# Patient Record
Sex: Male | Born: 1935 | Race: White | Hispanic: No | Smoking: Former smoker
Health system: Southern US, Community
[De-identification: ages and names within clinical notes are randomized; demographics above are authoritative.]

## PROBLEM LIST (undated history)

## (undated) DIAGNOSIS — I10 Essential (primary) hypertension: Secondary | ICD-10-CM

## (undated) DIAGNOSIS — N400 Enlarged prostate without lower urinary tract symptoms: Secondary | ICD-10-CM

## (undated) DIAGNOSIS — K4090 Unilateral inguinal hernia, without obstruction or gangrene, not specified as recurrent: Secondary | ICD-10-CM

## (undated) DIAGNOSIS — R569 Unspecified convulsions: Secondary | ICD-10-CM

## (undated) DIAGNOSIS — J189 Pneumonia, unspecified organism: Secondary | ICD-10-CM

## (undated) DIAGNOSIS — E119 Type 2 diabetes mellitus without complications: Secondary | ICD-10-CM

## (undated) DIAGNOSIS — F411 Generalized anxiety disorder: Secondary | ICD-10-CM

## (undated) DIAGNOSIS — M1990 Unspecified osteoarthritis, unspecified site: Secondary | ICD-10-CM

## (undated) DIAGNOSIS — Z87442 Personal history of urinary calculi: Secondary | ICD-10-CM

## (undated) DIAGNOSIS — I639 Cerebral infarction, unspecified: Secondary | ICD-10-CM

## (undated) DIAGNOSIS — G4700 Insomnia, unspecified: Secondary | ICD-10-CM

## (undated) DIAGNOSIS — D649 Anemia, unspecified: Secondary | ICD-10-CM

## (undated) DIAGNOSIS — H409 Unspecified glaucoma: Secondary | ICD-10-CM

## (undated) HISTORY — DX: Benign prostatic hyperplasia without lower urinary tract symptoms: N40.0

## (undated) HISTORY — DX: Essential (primary) hypertension: I10

## (undated) HISTORY — DX: Insomnia, unspecified: G47.00

## (undated) HISTORY — PX: CATARACT EXTRACTION, BILATERAL: SHX1313

## (undated) HISTORY — PX: HIP SURGERY: SHX245

## (undated) HISTORY — PX: OTHER SURGICAL HISTORY: SHX169

## (undated) HISTORY — DX: Unspecified glaucoma: H40.9

## (undated) HISTORY — DX: Generalized anxiety disorder: F41.1

## (undated) HISTORY — DX: Unilateral inguinal hernia, without obstruction or gangrene, not specified as recurrent: K40.90

## (undated) HISTORY — DX: Type 2 diabetes mellitus without complications: E11.9

---

## 2018-11-13 ENCOUNTER — Encounter: Payer: Self-pay | Admitting: Cardiology

## 2018-11-29 ENCOUNTER — Encounter: Payer: Self-pay | Admitting: Podiatry

## 2018-11-29 ENCOUNTER — Ambulatory Visit (INDEPENDENT_AMBULATORY_CARE_PROVIDER_SITE_OTHER): Payer: Medicare Other | Admitting: Podiatry

## 2018-11-29 ENCOUNTER — Other Ambulatory Visit: Payer: Self-pay

## 2018-11-29 DIAGNOSIS — M79675 Pain in left toe(s): Secondary | ICD-10-CM

## 2018-11-29 DIAGNOSIS — M79674 Pain in right toe(s): Secondary | ICD-10-CM

## 2018-11-29 DIAGNOSIS — B351 Tinea unguium: Secondary | ICD-10-CM | POA: Diagnosis not present

## 2018-11-29 NOTE — Progress Notes (Signed)
Complaint:  Visit Type: Patient presents  to my office for  preventative foot care services.  He has moved to [place] from  [place]. Complaint: Patient states" my nails have grown long and thick and become painful to walk and wear shoes" Patient has history of nail surgery both great toes.   The patient presents for preventative foot care services. No changes to ROS  Podiatric Exam: Vascular: dorsalis pedis and posterior tibial pulses are palpable bilateral. Capillary return is immediate. Temperature gradient is WNL. Skin turgor WNL  Sensorium: Diminished/absent  Semmes Weinstein monofilament test. Diminished  tactile sensation bilaterally. Nail Exam: Pt has thick disfigured discolored nails with subungual debris noted bilateral entire nail hallux through fifth toenails Ulcer Exam: There is no evidence of ulcer or pre-ulcerative changes or infection. Orthopedic Exam: Muscle tone and strength are WNL. No limitations in general ROM. No crepitus or effusions noted. Foot type and digits show no abnormalities. Midfoot arthritis right foot. Skin: No Porokeratosis. No infection or ulcers  Diagnosis:  Onychomycosis, , Pain in right toe, pain in left toes  Treatment & Plan Procedures and Treatment: Consent by patient was obtained for treatment procedures.   Debridement of mycotic and hypertrophic toenails, 1 through 5 bilateral and clearing of subungual debris. No ulceration, no infection noted.  Return Visit-Office Procedure: Patient instructed to return to the office for a follow up visit 10 weeks  for continued evaluation and treatment.    Shaquala Broeker DPM 

## 2019-01-30 ENCOUNTER — Other Ambulatory Visit: Payer: Self-pay | Admitting: Family Medicine

## 2019-01-30 DIAGNOSIS — R1032 Left lower quadrant pain: Secondary | ICD-10-CM

## 2019-02-05 ENCOUNTER — Ambulatory Visit
Admission: RE | Admit: 2019-02-05 | Discharge: 2019-02-05 | Disposition: A | Discharge status: Home / Self Care | Payer: Medicare Other | Source: Ambulatory Visit | Attending: Family Medicine | Admitting: Family Medicine

## 2019-02-05 DIAGNOSIS — R1032 Left lower quadrant pain: Secondary | ICD-10-CM

## 2019-02-12 ENCOUNTER — Ambulatory Visit: Payer: Medicare Other | Admitting: Podiatry

## 2019-02-18 ENCOUNTER — Telehealth: Payer: Self-pay | Admitting: Cardiology

## 2019-02-18 NOTE — Telephone Encounter (Signed)
New Message:    Pt wants to know if his daughter can come in with him for his appt tomorrow with Dr Martinique please?

## 2019-02-18 NOTE — Telephone Encounter (Signed)
New Message   Patient's daughter is calling in to get approval to accompany patient to his appointment on 02/19/19 at 2:40 pm with Dr. Martinique. Please give a call back to confirm.

## 2019-02-18 NOTE — Progress Notes (Signed)
Cardiology Office Note   Date:  02/19/2019   ID:  Russell Ayers, DOB 12/26/1932, MRN [id number]  PCP:  Soundra Pilon, FNP  Cardiologist:   Peter Swaziland, MD   Chief Complaint  Patient presents with  . Loss of Consciousness      History of Present Illness: Russell Ayers is a 85 y.o. male who is seen at the request of Ernst Bowler PA-C for evaluation of syncope. He has a history of HTN, glaucoma, and DM diet controlled.  He states that 6 days ago he was watching TV until 12 midnight. He believes he got up to go to bed but the next thing he knew he was on the floor. Did not report any dizziness, pain, clamminess, dyspnea, confusion or loss of bowel/bladder continence. He states he was on his back and had a difficult time getting up. Able to crawl to door and called EMS. Did not go to ED. No HA or other Neurologic symptoms. Has never passed out before but has had falls.     Past Medical History:  Diagnosis Date  . Diabetes mellitus without complication (HCC)   . Generalized anxiety disorder   . Glaucoma   . Hypertension   . Inguinal hernia   . Insomnia   . Prostatism     Past Surgical History:  Procedure Laterality Date  . CATARACT EXTRACTION, BILATERAL    . glaucoma       Current Outpatient Medications  Medication Sig Dispense Refill  . benazepril (LOTENSIN) 5 MG tablet Take 5 mg by mouth daily.    . Calcium Citrate-Vitamin D (CALCIUM CITRATE + PO) Take by mouth.    . cetirizine (ZYRTEC) 10 MG chewable tablet Chew 10 mg by mouth daily.    . finasteride (PROSCAR) 5 MG tablet Take 5 mg by mouth daily.    Marland Kitchen FISH OIL-KRILL OIL PO Take by mouth.    . fluticasone (FLONASE) 50 MCG/ACT nasal spray Place into both nostrils daily.    . hydrochlorothiazide (HYDRODIURIL) 12.5 MG tablet Take 12.5 mg by mouth daily.    Marland Kitchen latanoprost (XALATAN) 0.005 % ophthalmic solution INSTILL 1 DROP IN [facility] EYE AT BEDTIME    . LORazepam (ATIVAN) 2 MG tablet Take 2 mg by mouth 2 (two)  times daily.     Marland Kitchen MELATONIN ER PO Take by mouth.    . PSYLLIUM HUSK PO Take by mouth 3 (three) times daily.     Marland Kitchen Specialty Vitamins Products (PROSTATE PO) Take by mouth. Essentials    . temazepam (RESTORIL) 30 MG capsule TAKE 1 CAPSULE BY MOUTH EVERY NIGHT FOR SLEEP DO NOT TAKE WITHIN 8 HOURS OF LORAZEPAM    . terazosin (HYTRIN) 1 MG capsule TAKE 1 CAPSULE EVERY NIGHT FOR BPH    . timolol (TIMOPTIC) 0.5 % ophthalmic solution INSTILL 1 DROP INTO BOTH EYES TWICE A DAY     No current facility-administered medications for this visit.     Allergies:   Patient has no known allergies.    Social History:  The patient  reports that he quit smoking about 30 years ago. He has never used smokeless tobacco. He reports that he does not drink alcohol or use drugs.   Family History:  The patient's family history includes Cancer in his brother; Heart attack in his brother.    ROS:  Please see the history of present illness.   Otherwise, review of systems are positive for none.   All other systems are  reviewed and negative.    PHYSICAL EXAM: VS:  BP 122/78   Pulse 83   Temp (!) 96.9 F (36.1 C)   Ht 5\' 11"  (1.803 m)   Wt 179 lb 12.8 oz (81.6 kg)   SpO2 99%   BMI 25.08 kg/m  , BMI Body mass index is 25.08 kg/m.  Orthostatic BP 129/81 supine, 127/85 sitting, 125/77 standing GEN: Well nourished, elderly, obese WM, in no acute distress  HEENT: normal  Neck: no JVD, carotid bruits, or masses Cardiac: RRR; no murmurs, rubs, or gallops,no edema  Respiratory:  clear to auscultation bilaterally, normal work of breathing GI: soft, nontender, nondistended, + BS MS: no deformity or atrophy  Skin: warm and dry, no rash Neuro:  Strength and sensation are intact Psych: euthymic mood, depressed affect   EKG:  EKG is ordered today. The ekg ordered today demonstrates NSR with rate 76. Normal Ecg. Extended strip shows few PVCs. I have personally reviewed and interpreted this study.    Recent Labs:  No results found for requested labs within last 8760 hours.    Lipid Panel No results found for: CHOL, TRIG, HDL, CHOLHDL, VLDL, LDLCALC, LDLDIRECT    Wt Readings from Last 3 Encounters:  02/19/19 179 lb 12.8 oz (81.6 kg)      Other studies Reviewed: Additional studies/ records that were reviewed today include:   Labs dated 11/13/18: A1c 6.8%. BMET and ALT normal.    ASSESSMENT AND PLAN:  1.  Syncope. No warning symptoms. Event unwitnessed. Concerning for potential arrhythmia. He is at increased risk of orthostasis given current medication but is not orthostatic today. Recommend checking baseline labs to make sure he is not anemic. Will have him wear a Zio patch monitor for 14 days. Check Echo. Follow up after above studies. 2. HTN well controlled.    Current medicines are reviewed at length with the patient today.  The patient does not have concerns regarding medicines.  The following changes have been made:  no change  Labs/ tests ordered today include:   Orders Placed This Encounter  Procedures  . CBC w/Diff/Platelet  . Comprehensive Metabolic Panel (CMET)  . TSH  . LONG TERM MONITOR (3-14 DAYS)  . EKG 12-Lead  . ECHOCARDIOGRAM COMPLETE     Disposition:   FU after above studies. Signed, Peter Martinique, MD  02/19/2019 12:09 PM    Russell Ayers [address], [place], [place], [address] Phone [phone], Fax [phone]

## 2019-02-18 NOTE — Telephone Encounter (Signed)
Spoke with pt, he will call his daughter on the phone with dr Martinique in the room so she can participate in the visit. Pt agreed with this plan.

## 2019-02-19 ENCOUNTER — Ambulatory Visit: Payer: Medicare Other | Admitting: Cardiology

## 2019-02-19 ENCOUNTER — Other Ambulatory Visit: Payer: Self-pay

## 2019-02-19 ENCOUNTER — Ambulatory Visit (INDEPENDENT_AMBULATORY_CARE_PROVIDER_SITE_OTHER): Payer: Medicare Other | Admitting: Cardiology

## 2019-02-19 ENCOUNTER — Encounter: Payer: Self-pay | Admitting: Cardiology

## 2019-02-19 VITALS — BP 122/78 | HR 83 | Temp 96.9°F | Ht 71.0 in | Wt 179.8 lb

## 2019-02-19 DIAGNOSIS — R55 Syncope and collapse: Secondary | ICD-10-CM | POA: Diagnosis not present

## 2019-02-19 DIAGNOSIS — I10 Essential (primary) hypertension: Secondary | ICD-10-CM

## 2019-02-19 LAB — CBC WITH DIFFERENTIAL/PLATELET
Basophils Absolute: 0.1 10*3/uL (ref 0.0–0.2)
Basos: 1 %
EOS (ABSOLUTE): 0.4 10*3/uL (ref 0.0–0.4)
Eos: 6 %
Hematocrit: 37.6 % (ref 37.5–51.0)
Hemoglobin: 13.4 g/dL (ref 13.0–17.7)
Immature Grans (Abs): 0 10*3/uL (ref 0.0–0.1)
Immature Granulocytes: 0 %
Lymphocytes Absolute: 2.3 10*3/uL (ref 0.7–3.1)
Lymphs: 34 %
MCH: 32.3 pg (ref 26.6–33.0)
MCHC: 35.6 g/dL (ref 31.5–35.7)
MCV: 91 fL (ref 79–97)
Monocytes Absolute: 0.4 10*3/uL (ref 0.1–0.9)
Monocytes: 6 %
Neutrophils Absolute: 3.6 10*3/uL (ref 1.4–7.0)
Neutrophils: 53 %
Platelets: 226 10*3/uL (ref 150–450)
RBC: 4.15 x10E6/uL (ref 4.14–5.80)
RDW: 11.5 % — ABNORMAL LOW (ref 11.6–15.4)
WBC: 6.7 10*3/uL (ref 3.4–10.8)

## 2019-02-19 LAB — COMPREHENSIVE METABOLIC PANEL
ALT: 23 IU/L (ref 0–44)
AST: 26 IU/L (ref 0–40)
Albumin/Globulin Ratio: 1.6 (ref 1.2–2.2)
Albumin: 4.1 g/dL (ref 3.6–4.6)
Alkaline Phosphatase: 92 IU/L (ref 39–117)
BUN/Creatinine Ratio: 9 — ABNORMAL LOW (ref 10–24)
BUN: 9 mg/dL (ref 8–27)
Bilirubin Total: 0.3 mg/dL (ref 0.0–1.2)
CO2: 24 mmol/L (ref 20–29)
Calcium: 9.5 mg/dL (ref 8.6–10.2)
Chloride: 97 mmol/L (ref 96–106)
Creatinine, Ser: 0.97 mg/dL (ref 0.76–1.27)
GFR calc Af Amer: 83 mL/min/{1.73_m2} (ref >59)
GFR calc non Af Amer: 71 mL/min/{1.73_m2} (ref >59)
Globulin, Total: 2.6 g/dL (ref 1.5–4.5)
Glucose: 126 mg/dL — ABNORMAL HIGH (ref 65–99)
Potassium: 4.9 mmol/L (ref 3.5–5.2)
Sodium: 135 mmol/L (ref 134–144)
Total Protein: 6.7 g/dL (ref 6.0–8.5)

## 2019-02-19 LAB — TSH: TSH: 0.963 u[IU]/mL (ref 0.450–4.500)

## 2019-02-19 NOTE — Patient Instructions (Signed)
Medication Instructions:  Continue same medications   Lab work: Cbc,cmet,tsh today   Testing/Procedures: Schedule 14 day Zio monitor Schedule Echo  Follow-Up: At [facility], you and your health needs are our priority.  As part of our continuing mission to provide you with exceptional heart care, we have created designated Provider Care Teams.  These Care Teams include your primary Cardiologist (physician) and Advanced Practice Providers (APPs -  Physician Assistants and Nurse Practitioners) who all work together to provide you with the care you need, when you need it. . Schedule follow up appointment with a PA after test

## 2019-02-25 ENCOUNTER — Other Ambulatory Visit (HOSPITAL_COMMUNITY): Payer: Medicare Other

## 2019-02-25 ENCOUNTER — Other Ambulatory Visit: Payer: Self-pay

## 2019-02-25 ENCOUNTER — Ambulatory Visit (HOSPITAL_COMMUNITY): Payer: Medicare Other | Attending: Cardiology

## 2019-02-25 DIAGNOSIS — R55 Syncope and collapse: Secondary | ICD-10-CM | POA: Diagnosis not present

## 2019-02-27 ENCOUNTER — Telehealth: Payer: Self-pay | Admitting: *Deleted

## 2019-02-27 NOTE — Telephone Encounter (Signed)
14 day ZIO XT long term holter monitor to be mailed to daughter, Dorethea Clan at [address] [place], [place] [address].  Instructions reviewed briefly as they are included in the monitor kit.

## 2019-03-05 ENCOUNTER — Ambulatory Visit (INDEPENDENT_AMBULATORY_CARE_PROVIDER_SITE_OTHER): Payer: Medicare Other

## 2019-03-05 DIAGNOSIS — R55 Syncope and collapse: Secondary | ICD-10-CM

## 2019-03-25 ENCOUNTER — Other Ambulatory Visit: Payer: Self-pay

## 2019-03-25 ENCOUNTER — Encounter: Payer: Self-pay | Admitting: Podiatry

## 2019-03-25 ENCOUNTER — Ambulatory Visit (INDEPENDENT_AMBULATORY_CARE_PROVIDER_SITE_OTHER): Payer: Medicare Other | Admitting: Podiatry

## 2019-03-25 DIAGNOSIS — M79674 Pain in right toe(s): Secondary | ICD-10-CM | POA: Diagnosis not present

## 2019-03-25 DIAGNOSIS — B351 Tinea unguium: Secondary | ICD-10-CM | POA: Diagnosis not present

## 2019-03-25 DIAGNOSIS — M79675 Pain in left toe(s): Secondary | ICD-10-CM | POA: Diagnosis not present

## 2019-03-25 NOTE — Progress Notes (Signed)
Complaint:  Visit Type: Patient presents  to my office for  preventative foot care services.  He has moved to [place] from  [place]. Complaint: Patient states" my nails have grown long and thick and become painful to walk and wear shoes" Patient has history of nail surgery both great toes.   The patient presents for preventative foot care services. No changes to ROS  Podiatric Exam: Vascular: dorsalis pedis and posterior tibial pulses are palpable bilateral. Capillary return is immediate. Temperature gradient is WNL. Skin turgor WNL  Sensorium: Diminished/absent  Semmes Weinstein monofilament test. Diminished  tactile sensation bilaterally. Nail Exam: Pt has thick disfigured discolored nails with subungual debris noted bilateral entire nail hallux through fifth toenails Ulcer Exam: There is no evidence of ulcer or pre-ulcerative changes or infection. Orthopedic Exam: Muscle tone and strength are WNL. No limitations in general ROM. No crepitus or effusions noted. Foot type and digits show no abnormalities. Midfoot arthritis right foot. Skin: No Porokeratosis. No infection or ulcers  Diagnosis:  Onychomycosis, , Pain in right toe, pain in left toes  Treatment & Plan Procedures and Treatment: Consent by patient was obtained for treatment procedures.   Debridement of mycotic and hypertrophic toenails, 1 through 5 bilateral and clearing of subungual debris. No ulceration, no infection noted.  Return Visit-Office Procedure: Patient instructed to return to the office for a follow up visit 10 weeks  for continued evaluation and treatment.    Rhyland Hinderliter DPM 

## 2019-03-26 ENCOUNTER — Telehealth: Payer: Self-pay | Admitting: Medical

## 2019-03-26 NOTE — Telephone Encounter (Signed)
Will route to PA that patient is seeing to see if appointment is still needed.

## 2019-03-26 NOTE — Telephone Encounter (Signed)
Russell Ayers, I agree with the daughter - not sure it will benefit them to come in. I would be happy to see them later this month, either in office or would be reasonable for a virtual visit to follow-up on the monitor results. I will copy Ellison Carwin so that we can address this in the morning.   Thanks!

## 2019-03-26 NOTE — Telephone Encounter (Signed)
New Message:    Daughter wants to know if pt should keep her appointment for tomorrow? It is to review her Monitor results and she is not sure you have the results, since pt was late getting her Monitor.Marland KitchenMarland Kitchen

## 2019-03-27 ENCOUNTER — Ambulatory Visit: Payer: Medicare Other | Admitting: Medical

## 2019-03-27 NOTE — Progress Notes (Signed)
Cardiology Office Note   Date:  03/31/2019   ID:  Cutberto R Ayers, DOB 1933/01/13, MRN [id number]  PCP:  Kristen Loader, FNP  Cardiologist:   Peter Martinique, MD   Chief Complaint  Patient presents with  . Loss of Consciousness      History of Present Illness: Russell Ayers is a 85 y.o. male who is seen for follow up of syncope. He has a history of HTN, glaucoma, and DM diet controlled.  He was seen in September after an episode of syncope. He states  he was watching TV until 12 midnight. He believes he got up to go to bed but the next thing he knew he was on the floor. Did not report any dizziness, pain, clamminess, dyspnea, confusion or loss of bowel/bladder continence. He states he was on his back and had a difficult time getting up. Able to crawl to door and called EMS. Did not go to ED. No HA or other Neurologic symptoms. Has never passed out before but has had falls.   We performed an Echo which was normal except for small to moderate apical pericardial effusion. Zio patch monitor was placed and results are pending. He has no recurrent symptoms of syncope or dizziness.     Past Medical History:  Diagnosis Date  . Diabetes mellitus without complication ([facility])   . Generalized anxiety disorder   . Glaucoma   . Hypertension   . Inguinal hernia   . Insomnia   . Prostatism     Past Surgical History:  Procedure Laterality Date  . CATARACT EXTRACTION, BILATERAL    . glaucoma       Current Outpatient Medications  Medication Sig Dispense Refill  . benazepril (LOTENSIN) 5 MG tablet Take 5 mg by mouth daily.    . Calcium Citrate-Vitamin D (CALCIUM CITRATE + PO) Take by mouth.    . cetirizine (ZYRTEC) 10 MG chewable tablet Chew 10 mg by mouth daily.    . finasteride (PROSCAR) 5 MG tablet Take 5 mg by mouth daily.    Marland Kitchen FISH OIL-KRILL OIL PO Take by mouth.    . fluticasone (FLONASE) 50 MCG/ACT nasal spray Place into both nostrils daily.    . hydrochlorothiazide (HYDRODIURIL)  12.5 MG tablet Take 12.5 mg by mouth daily.    Marland Kitchen latanoprost (XALATAN) 0.005 % ophthalmic solution INSTILL 1 DROP IN [facility] EYE AT BEDTIME    . LORazepam (ATIVAN) 2 MG tablet Take 2 mg by mouth 2 (two) times daily.     Marland Kitchen MELATONIN ER PO Take by mouth.    . PSYLLIUM HUSK PO Take by mouth 3 (three) times daily.     Marland Kitchen Specialty Vitamins Products (PROSTATE PO) Take by mouth. Essentials    . temazepam (RESTORIL) 30 MG capsule TAKE 1 CAPSULE BY MOUTH EVERY NIGHT FOR SLEEP DO NOT TAKE WITHIN 8 HOURS OF LORAZEPAM    . terazosin (HYTRIN) 1 MG capsule TAKE 1 CAPSULE EVERY NIGHT FOR BPH    . timolol (TIMOPTIC) 0.5 % ophthalmic solution INSTILL 1 DROP INTO BOTH EYES TWICE A DAY     No current facility-administered medications for this visit.     Allergies:   Patient has no known allergies.    Social History:  The patient  reports that he quit smoking about 30 years ago. He has never used smokeless tobacco. He reports that he does not drink alcohol or use drugs.   Family History:  The patient's family history includes Cancer  in his brother; Heart attack in his brother.    ROS:  Please see the history of present illness.   Otherwise, review of systems are positive for none.   All other systems are reviewed and negative.    PHYSICAL EXAM: VS:  BP (!) 146/81   Pulse 86   Temp (!) 96.1 F (35.6 C)   Ht 5\' 11"  (1.803 m)   Wt 181 lb (82.1 kg)   SpO2 100%   BMI 25.24 kg/m  , BMI Body mass index is 25.24 kg/m.  GEN: Well nourished, elderly, obese WM, in no acute distress  HEENT: normal  Neck: no JVD, carotid bruits, or masses Cardiac: RRR; no murmurs, rubs, or gallops,no edema  Respiratory:  clear to auscultation bilaterally, normal work of breathing GI: soft, nontender, nondistended, + BS MS: no deformity or atrophy  Skin: warm and dry, no rash Neuro:  Strength and sensation are intact Psych: euthymic mood, depressed affect   EKG:  EKG is not ordered today.   Recent Labs: 02/19/2019: ALT  23; BUN 9; Creatinine, Ser 0.97; Hemoglobin 13.4; Platelets 226; Potassium 4.9; Sodium 135; TSH 0.963    Lipid Panel No results found for: CHOL, TRIG, HDL, CHOLHDL, VLDL, LDLCALC, LDLDIRECT    Wt Readings from Last 3 Encounters:  03/31/19 181 lb (82.1 kg)  02/19/19 179 lb 12.8 oz (81.6 kg)      Other studies Reviewed: Additional studies/ records that were reviewed today include:   Labs dated 11/13/18: A1c 6.8%. BMET and ALT normal.   Echo 02/25/19: IMPRESSIONS    1. Left ventricular ejection fraction, by visual estimation, is 55%. The left ventricle has normal function. Normal left ventricular size. There is no left ventricular hypertrophy. Technically difficult study, hard to comment on motion of individual  wall segments but overall EF appears normal.  2. Left ventricular diastolic Doppler parameters are consistent with impaired relaxation pattern of LV diastolic filling.  3. The tricuspid valve is normal in structure. Tricuspid valve regurgitation is trivial.  4. The aortic valve was not well visualized Aortic valve regurgitation was not visualized by color flow Doppler. Mild aortic valve sclerosis without stenosis.  5. There is mild dilatation of the aortic root measuring 37 mm.  6. Global right ventricle has normal systolic function.The right ventricular size is normal. No increase in right ventricular wall thickness.  7. Left atrial size was normal.  8. Right atrial size was normal.  9. The mitral valve is normal in structure. Trace mitral valve regurgitation. No evidence of mitral stenosis. 10. Moderate pericardial effusion. The effusion was primarily in the apical area and laterally. No evidence for tamponade. 11. The inferior vena cava is normal in size with greater than 50% respiratory variability, suggesting right atrial pressure of 3 mmHg. 12. The tricuspid regurgitant velocity is 2.17 m/s, and with an assumed right atrial pressure of 3 mmHg, the estimated right  ventricular systolic pressure is normal at 21.8 mmHg.  ASSESSMENT AND PLAN:  1.  Syncope. No warning symptoms. Event unwitnessed. Concerning for potential arrhythmia. Not previously orthostatic.  Labs and Echo unremarkable. Will await results of monitor. If no significant arrhythmia will watch and wait.   2. HTN well controlled.    Current medicines are reviewed at length with the patient today.  The patient does not have concerns regarding medicines.  The following changes have been made:  no change  Labs/ tests ordered today include:   No orders of the defined types were placed in this  encounter.    Disposition:   FU after above studies. Signed, Peter SwazilandJordan, MD  03/31/2019 10:19 AM    [facility] [address], [place], [place], [address] Phone [phone], Fax [phone]

## 2019-03-28 NOTE — Telephone Encounter (Signed)
Patient moved appointment to 11/09 with Dr.Jordan.

## 2019-03-31 ENCOUNTER — Encounter: Payer: Self-pay | Admitting: Cardiology

## 2019-03-31 ENCOUNTER — Other Ambulatory Visit: Payer: Self-pay

## 2019-03-31 ENCOUNTER — Ambulatory Visit (INDEPENDENT_AMBULATORY_CARE_PROVIDER_SITE_OTHER): Payer: Medicare Other | Admitting: Cardiology

## 2019-03-31 VITALS — BP 146/81 | HR 86 | Temp 96.1°F | Ht 71.0 in | Wt 181.0 lb

## 2019-03-31 DIAGNOSIS — R55 Syncope and collapse: Secondary | ICD-10-CM

## 2019-03-31 DIAGNOSIS — I10 Essential (primary) hypertension: Secondary | ICD-10-CM

## 2019-06-03 ENCOUNTER — Encounter: Payer: Self-pay | Admitting: Podiatry

## 2019-06-03 ENCOUNTER — Other Ambulatory Visit: Payer: Self-pay

## 2019-06-03 ENCOUNTER — Ambulatory Visit (INDEPENDENT_AMBULATORY_CARE_PROVIDER_SITE_OTHER): Payer: Medicare Other | Admitting: Podiatry

## 2019-06-03 DIAGNOSIS — M79674 Pain in right toe(s): Secondary | ICD-10-CM | POA: Diagnosis not present

## 2019-06-03 DIAGNOSIS — B351 Tinea unguium: Secondary | ICD-10-CM

## 2019-06-03 DIAGNOSIS — M79675 Pain in left toe(s): Secondary | ICD-10-CM | POA: Diagnosis not present

## 2019-06-03 NOTE — Progress Notes (Signed)
Complaint:  Visit Type: Patient presents  to my office for  preventative foot care services.  He has moved to [place] from  [place]. Complaint: Patient states" my nails have grown long and thick and become painful to walk and wear shoes" Patient has history of nail surgery both great toes.   The patient presents for preventative foot care services. No changes to ROS  Podiatric Exam: Vascular: dorsalis pedis and posterior tibial pulses are palpable bilateral. Capillary return is immediate. Temperature gradient is WNL. Skin turgor WNL  Sensorium: Diminished/absent  Semmes Weinstein monofilament test. Diminished  tactile sensation bilaterally. Nail Exam: Pt has thick disfigured discolored nails with subungual debris noted bilateral entire nail hallux through fifth toenails Ulcer Exam: There is no evidence of ulcer or pre-ulcerative changes or infection. Orthopedic Exam: Muscle tone and strength are WNL. No limitations in general ROM. No crepitus or effusions noted. Foot type and digits show no abnormalities. Midfoot arthritis right foot. Skin: No Porokeratosis. No infection or ulcers  Diagnosis:  Onychomycosis, , Pain in right toe, pain in left toes  Treatment & Plan Procedures and Treatment: Consent by patient was obtained for treatment procedures.   Debridement of mycotic and hypertrophic toenails, 1 through 5 bilateral and clearing of subungual debris. No ulceration, no infection noted.  Return Visit-Office Procedure: Patient instructed to return to the office for a follow up visit 10 weeks  for continued evaluation and treatment.    Helane Gunther DPM

## 2019-06-21 ENCOUNTER — Ambulatory Visit: Payer: Medicare Other

## 2019-08-20 ENCOUNTER — Other Ambulatory Visit: Payer: Self-pay

## 2019-08-20 ENCOUNTER — Encounter: Payer: Self-pay | Admitting: Podiatry

## 2019-08-20 ENCOUNTER — Ambulatory Visit (INDEPENDENT_AMBULATORY_CARE_PROVIDER_SITE_OTHER): Payer: Medicare Other | Admitting: Podiatry

## 2019-08-20 VITALS — Temp 97.3°F

## 2019-08-20 DIAGNOSIS — B351 Tinea unguium: Secondary | ICD-10-CM | POA: Diagnosis not present

## 2019-08-20 DIAGNOSIS — M79674 Pain in right toe(s): Secondary | ICD-10-CM

## 2019-08-20 DIAGNOSIS — M79675 Pain in left toe(s): Secondary | ICD-10-CM | POA: Diagnosis not present

## 2019-08-20 NOTE — Progress Notes (Signed)
This patient returns to the office for evaluation and treatment of long thick painful nails .  This patient is unable to trim his own nails since the patient cannot reach the feet.  Patient says the nails are painful walking and wearing his shoes.  He returns for preventive foot care services.  General Appearance  Alert, conversant and in no acute stress.  Vascular  Dorsalis pedis and posterior tibial  pulses are palpable  bilaterally.  Capillary return is within normal limits  bilaterally. Temperature is within normal limits  bilaterally.  Neurologic  Senn-Weinstein monofilament wire test diminished/absent  bilaterally. Muscle power within normal limits bilaterally.  Nails Thick disfigured discolored nails with subungual debris  from hallux to fifth toes bilaterally. No evidence of bacterial infection or drainage bilaterally.  Orthopedic  No limitations of motion  feet .  No crepitus or effusions noted.  No bony pathology or digital deformities noted.  Skin  normotropic skin with no porokeratosis noted bilaterally.  No signs of infections or ulcers noted.     Onychomycosis  Pain in toes right foot  Pain in toes left foot  Debridement  of nails  1-5  B/L with a nail nipper.  Nails were then filed using a dremel tool with no incidents.    RTC  10 weeks    Helane Gunther DPM

## 2019-11-05 ENCOUNTER — Encounter: Payer: Self-pay | Admitting: Podiatry

## 2019-11-05 ENCOUNTER — Ambulatory Visit (INDEPENDENT_AMBULATORY_CARE_PROVIDER_SITE_OTHER): Payer: Medicare Other | Admitting: Podiatry

## 2019-11-05 ENCOUNTER — Other Ambulatory Visit: Payer: Self-pay

## 2019-11-05 DIAGNOSIS — M79675 Pain in left toe(s): Secondary | ICD-10-CM | POA: Diagnosis not present

## 2019-11-05 DIAGNOSIS — M79674 Pain in right toe(s): Secondary | ICD-10-CM | POA: Diagnosis not present

## 2019-11-05 DIAGNOSIS — B351 Tinea unguium: Secondary | ICD-10-CM | POA: Diagnosis not present

## 2019-11-05 NOTE — Progress Notes (Addendum)
This patient returns to the office for evaluation and treatment of long thick painful nails .  This patient is unable to trim his own nails since the patient cannot reach the feet.  Patient says the nails are painful walking and wearing his shoes.  He returns for preventive foot care services.    General Appearance  Alert, conversant and in no acute stress.  Vascular  Dorsalis pedis and posterior tibial  pulses are palpable  bilaterally.  Capillary return is within normal limits  bilaterally. Temperature is within normal limits  bilaterally.  Neurologic  Senn-Weinstein monofilament wire test diminished/absent  bilaterally. Muscle power within normal limits bilaterally.  Nails Thick disfigured discolored nails with subungual debris  from hallux to fifth toes bilaterally. No evidence of bacterial infection or drainage bilaterally.  Orthopedic  No limitations of motion  feet .  No crepitus or effusions noted.  No bony pathology or digital deformities noted.  HAV  B/L.  Skin  normotropic skin with no porokeratosis noted bilaterally.  No signs of infections or ulcers noted.     Onychomycosis  Pain in toes right foot  Pain in toes left foot  Debridement  of nails  1-5  B/L with a nail nipper.  Nails were then filed using a dremel tool with no incidents. Patient requests diabetic shoes.  To be evaluated by  [facility].   RTC  10 weeks    Helane Gunther DPM   Addendum  Reviewed his history and he was diagnosed as diabetic with diet controlled by Dr.  Thomasene Lot on 03/31/2019  Helane Gunther DPM

## 2020-01-28 ENCOUNTER — Other Ambulatory Visit: Payer: Self-pay

## 2020-01-28 ENCOUNTER — Ambulatory Visit: Payer: Medicare Other | Admitting: Orthotics

## 2020-01-28 ENCOUNTER — Encounter: Payer: Self-pay | Admitting: Podiatry

## 2020-01-28 ENCOUNTER — Ambulatory Visit (INDEPENDENT_AMBULATORY_CARE_PROVIDER_SITE_OTHER): Payer: Medicare Other | Admitting: Podiatry

## 2020-01-28 DIAGNOSIS — B351 Tinea unguium: Secondary | ICD-10-CM

## 2020-01-28 DIAGNOSIS — M2012 Hallux valgus (acquired), left foot: Secondary | ICD-10-CM | POA: Diagnosis not present

## 2020-01-28 DIAGNOSIS — M79675 Pain in left toe(s): Secondary | ICD-10-CM

## 2020-01-28 DIAGNOSIS — M79674 Pain in right toe(s): Secondary | ICD-10-CM | POA: Diagnosis not present

## 2020-01-28 DIAGNOSIS — M2011 Hallux valgus (acquired), right foot: Secondary | ICD-10-CM | POA: Diagnosis not present

## 2020-01-28 DIAGNOSIS — E119 Type 2 diabetes mellitus without complications: Secondary | ICD-10-CM | POA: Diagnosis not present

## 2020-01-28 NOTE — Progress Notes (Signed)
This patient returns to the office for evaluation and treatment of long thick painful nails .  This patient is unable to trim his own nails since the patient cannot reach the feet.  Patient says the nails are painful walking and wearing his shoes.  He returns for preventive foot care services.    General Appearance  Alert, conversant and in no acute stress.  Vascular  Dorsalis pedis and posterior tibial  pulses are palpable  bilaterally.  Capillary return is within normal limits  bilaterally. Temperature is within normal limits  bilaterally.  Neurologic  Senn-Weinstein monofilament wire test diminished/absent  bilaterally. Muscle power within normal limits bilaterally.  Nails Thick disfigured discolored nails with subungual debris  from hallux to fifth toes bilaterally. No evidence of bacterial infection or drainage bilaterally.  Orthopedic  No limitations of motion  feet .  No crepitus or effusions noted.  No bony pathology or digital deformities noted.  HAV  B/L.  Skin  normotropic skin with no porokeratosis noted bilaterally.  No signs of infections or ulcers noted.     Onychomycosis  Pain in toes right foot  Pain in toes left foot  Debridement  of nails  1-5  B/L with a nail nipper.  Nails were then filed using a dremel tool with no incidents. Patient requests diabetic shoes.  Dispensing shoes .  RTC  10 weeks    Helane Gunther DPM   Addendum  Reviewed his history and he was diagnosed as diabetic with diet controlled by Dr.  Thomasene Lot on 03/31/2019  Helane Gunther DPM

## 2020-04-30 ENCOUNTER — Other Ambulatory Visit: Payer: Self-pay

## 2020-04-30 ENCOUNTER — Ambulatory Visit (INDEPENDENT_AMBULATORY_CARE_PROVIDER_SITE_OTHER): Payer: Medicare Other | Admitting: Podiatry

## 2020-04-30 ENCOUNTER — Encounter: Payer: Self-pay | Admitting: Podiatry

## 2020-04-30 DIAGNOSIS — M79674 Pain in right toe(s): Secondary | ICD-10-CM

## 2020-04-30 DIAGNOSIS — B351 Tinea unguium: Secondary | ICD-10-CM

## 2020-04-30 DIAGNOSIS — M79675 Pain in left toe(s): Secondary | ICD-10-CM

## 2020-04-30 DIAGNOSIS — E1140 Type 2 diabetes mellitus with diabetic neuropathy, unspecified: Secondary | ICD-10-CM | POA: Insufficient documentation

## 2020-04-30 DIAGNOSIS — E1142 Type 2 diabetes mellitus with diabetic polyneuropathy: Secondary | ICD-10-CM

## 2020-04-30 NOTE — Progress Notes (Signed)
This patient returns to the office for evaluation and treatment of long thick painful nails .  This patient is unable to trim his own nails since the patient cannot reach the feet.  Patient says the nails are painful walking and wearing his shoes.  He returns for preventive foot care services.    General Appearance  Alert, conversant and in no acute stress.  Vascular  Dorsalis pedis and posterior tibial  pulses are palpable  bilaterally.  Capillary return is within normal limits  bilaterally. Temperature is within normal limits  bilaterally.  Neurologic  Senn-Weinstein monofilament wire test diminished/absent  bilaterally. Muscle power within normal limits bilaterally.  Nails Thick disfigured discolored nails with subungual debris  from hallux to fifth toes bilaterally. No evidence of bacterial infection or drainage bilaterally.  Orthopedic  No limitations of motion  feet .  No crepitus or effusions noted.  No bony pathology or digital deformities noted.  HAV  B/L.  Skin  normotropic skin with no porokeratosis noted bilaterally.  No signs of infections or ulcers noted.     Onychomycosis  Pain in toes right foot  Pain in toes left foot  Debridement  of nails  1-5  B/L with a nail nipper.  Nails were then filed using a dremel tool with no incidents. Patient requests diabetic shoes.  Dispensing shoes .  RTC  10 weeks    Helane Gunther DPM   Addendum  Reviewed his history and he was diagnosed as diabetic with diet controlled by Dr.  Thomasene Lot on 03/31/2019  Helane Gunther DPM

## 2020-07-25 ENCOUNTER — Encounter (HOSPITAL_COMMUNITY): Payer: Self-pay

## 2020-07-25 ENCOUNTER — Inpatient Hospital Stay (HOSPITAL_COMMUNITY)
Admission: EM | Admit: 2020-07-25 | Discharge: 2020-08-03 | DRG: 388 | Disposition: A | Discharge status: Skilled Nursing Facility | Payer: Medicare Other | Attending: Family Medicine | Admitting: Family Medicine

## 2020-07-25 ENCOUNTER — Emergency Department (HOSPITAL_COMMUNITY): Payer: Medicare Other

## 2020-07-25 ENCOUNTER — Other Ambulatory Visit: Payer: Self-pay

## 2020-07-25 DIAGNOSIS — K56600 Partial intestinal obstruction, unspecified as to cause: Principal | ICD-10-CM | POA: Diagnosis present

## 2020-07-25 DIAGNOSIS — H409 Unspecified glaucoma: Secondary | ICD-10-CM | POA: Diagnosis present

## 2020-07-25 DIAGNOSIS — E876 Hypokalemia: Secondary | ICD-10-CM | POA: Diagnosis present

## 2020-07-25 DIAGNOSIS — I10 Essential (primary) hypertension: Secondary | ICD-10-CM | POA: Diagnosis present

## 2020-07-25 DIAGNOSIS — N400 Enlarged prostate without lower urinary tract symptoms: Secondary | ICD-10-CM | POA: Diagnosis present

## 2020-07-25 DIAGNOSIS — E861 Hypovolemia: Secondary | ICD-10-CM | POA: Diagnosis present

## 2020-07-25 DIAGNOSIS — Z8249 Family history of ischemic heart disease and other diseases of the circulatory system: Secondary | ICD-10-CM

## 2020-07-25 DIAGNOSIS — Z20822 Contact with and (suspected) exposure to covid-19: Secondary | ICD-10-CM | POA: Diagnosis present

## 2020-07-25 DIAGNOSIS — Z87891 Personal history of nicotine dependence: Secondary | ICD-10-CM

## 2020-07-25 DIAGNOSIS — G40901 Epilepsy, unspecified, not intractable, with status epilepticus: Secondary | ICD-10-CM | POA: Diagnosis not present

## 2020-07-25 DIAGNOSIS — K56609 Unspecified intestinal obstruction, unspecified as to partial versus complete obstruction: Secondary | ICD-10-CM | POA: Diagnosis not present

## 2020-07-25 DIAGNOSIS — G8384 Todd's paralysis (postepileptic): Secondary | ICD-10-CM | POA: Diagnosis not present

## 2020-07-25 DIAGNOSIS — F13239 Sedative, hypnotic or anxiolytic dependence with withdrawal, unspecified: Secondary | ICD-10-CM | POA: Diagnosis not present

## 2020-07-25 DIAGNOSIS — G9341 Metabolic encephalopathy: Secondary | ICD-10-CM | POA: Diagnosis not present

## 2020-07-25 DIAGNOSIS — Z79899 Other long term (current) drug therapy: Secondary | ICD-10-CM

## 2020-07-25 DIAGNOSIS — K4090 Unilateral inguinal hernia, without obstruction or gangrene, not specified as recurrent: Secondary | ICD-10-CM | POA: Diagnosis present

## 2020-07-25 DIAGNOSIS — R569 Unspecified convulsions: Secondary | ICD-10-CM

## 2020-07-25 DIAGNOSIS — R140 Abdominal distension (gaseous): Secondary | ICD-10-CM

## 2020-07-25 DIAGNOSIS — G4700 Insomnia, unspecified: Secondary | ICD-10-CM | POA: Diagnosis present

## 2020-07-25 DIAGNOSIS — N390 Urinary tract infection, site not specified: Secondary | ICD-10-CM | POA: Diagnosis present

## 2020-07-25 DIAGNOSIS — D649 Anemia, unspecified: Secondary | ICD-10-CM | POA: Diagnosis present

## 2020-07-25 DIAGNOSIS — R1032 Left lower quadrant pain: Secondary | ICD-10-CM

## 2020-07-25 DIAGNOSIS — F05 Delirium due to known physiological condition: Secondary | ICD-10-CM | POA: Diagnosis not present

## 2020-07-25 DIAGNOSIS — E119 Type 2 diabetes mellitus without complications: Secondary | ICD-10-CM | POA: Diagnosis present

## 2020-07-25 DIAGNOSIS — F411 Generalized anxiety disorder: Secondary | ICD-10-CM | POA: Diagnosis present

## 2020-07-25 DIAGNOSIS — E871 Hypo-osmolality and hyponatremia: Secondary | ICD-10-CM | POA: Diagnosis present

## 2020-07-25 LAB — CBC WITH DIFFERENTIAL/PLATELET
Abs Immature Granulocytes: 0.15 10*3/uL — ABNORMAL HIGH (ref 0.00–0.07)
Basophils Absolute: 0 10*3/uL (ref 0.0–0.1)
Basophils Relative: 0 %
Eosinophils Absolute: 0.1 10*3/uL (ref 0.0–0.5)
Eosinophils Relative: 1 %
HCT: 38.2 % — ABNORMAL LOW (ref 39.0–52.0)
Hemoglobin: 13 g/dL (ref 13.0–17.0)
Immature Granulocytes: 1 %
Lymphocytes Relative: 17 %
Lymphs Abs: 1.8 10*3/uL (ref 0.7–4.0)
MCH: 31.9 pg (ref 26.0–34.0)
MCHC: 34 g/dL (ref 30.0–36.0)
MCV: 93.9 fL (ref 80.0–100.0)
Monocytes Absolute: 0.5 10*3/uL (ref 0.1–1.0)
Monocytes Relative: 5 %
Neutro Abs: 8 10*3/uL — ABNORMAL HIGH (ref 1.7–7.7)
Neutrophils Relative %: 76 %
Platelets: 277 10*3/uL (ref 150–400)
RBC: 4.07 MIL/uL — ABNORMAL LOW (ref 4.22–5.81)
RDW: 12.6 % (ref 11.5–15.5)
WBC: 10.6 10*3/uL — ABNORMAL HIGH (ref 4.0–10.5)
nRBC: 0 % (ref 0.0–0.2)

## 2020-07-25 LAB — COMPREHENSIVE METABOLIC PANEL
ALT: 19 U/L (ref 0–44)
AST: 23 U/L (ref 15–41)
Albumin: 4.1 g/dL (ref 3.5–5.0)
Alkaline Phosphatase: 68 U/L (ref 38–126)
Anion gap: 12 (ref 5–15)
BUN: 12 mg/dL (ref 8–23)
CO2: 23 mmol/L (ref 22–32)
Calcium: 9.3 mg/dL (ref 8.9–10.3)
Chloride: 95 mmol/L — ABNORMAL LOW (ref 98–111)
Creatinine, Ser: 0.88 mg/dL (ref 0.61–1.24)
GFR, Estimated: >60 mL/min (ref >60)
Glucose, Bld: 164 mg/dL — ABNORMAL HIGH (ref 70–99)
Potassium: 3.7 mmol/L (ref 3.5–5.1)
Sodium: 130 mmol/L — ABNORMAL LOW (ref 135–145)
Total Bilirubin: 0.9 mg/dL (ref 0.3–1.2)
Total Protein: 7.3 g/dL (ref 6.5–8.1)

## 2020-07-25 LAB — LIPASE, BLOOD: Lipase: 28 U/L (ref 11–51)

## 2020-07-25 MED ORDER — SODIUM CHLORIDE 0.9 % IV BOLUS
1000.0000 mL | Freq: Once | INTRAVENOUS | Status: AC
  Administered 2020-07-25: 1000 mL via INTRAVENOUS

## 2020-07-25 MED ORDER — FENTANYL CITRATE (PF) 100 MCG/2ML IJ SOLN
50.0000 ug | Freq: Once | INTRAMUSCULAR | Status: AC
  Administered 2020-07-25: 50 ug via INTRAVENOUS
  Filled 2020-07-25: qty 2

## 2020-07-25 MED ORDER — IOHEXOL 300 MG/ML  SOLN
100.0000 mL | Freq: Once | INTRAMUSCULAR | Status: AC | PRN
  Administered 2020-07-26: 100 mL via INTRAVENOUS

## 2020-07-25 NOTE — ED Provider Notes (Signed)
00:00: Assumed care of patient from Harlene Salts, PA-C at change of shift pending CT abdomen/pelvis and disposition.  Please see prior provider note for full H&P.  Briefly patient is an 87 year old male who presented to the emergency department with complaints of left lower quadrant and left inguinal discomfort for approximately 8 hours prior to arrival.  Pain has been constant.  He noted some constipation, last bowel movement was yesterday morning.  He does not think he has been passing gas.  He denies any vomiting.  CT A/P: Multiple dilated loops of small bowel with a transition zone in the left lower quadrant likely secondary to inflammatory changes in the distal ileum. These changes are consistent with a partial small bowel obstruction. Diverticulosis without diverticulitis. Large hiatal hernia. Left inguinal hernia with a loop of sigmoid colon within although no obstructive changes are noted.   Transition zone noted on CT is in the location of patient's discomfort, he also was noted to have a left inguinal hernia which may be contributing to his discomfort as well, however no obstructive changes are noted within the hernia.   On reassessment patient states he is feeling mildly improved, remains with some discomfort but denies any further pain medication at this time.  He did have a small watery bowel movement in the ED, denies vomiting.   We will discuss with hospital service for observation admission at this time given abnormal CT findings, will hold off on NG tube given patient is not actively vomiting or in significant pain.   I discussed results results and treatment plan with the patient and his daughter at bedside, they are in agreement.  Findings and plan of care discussed with supervising physician Dr. Clarene Duke who is in agreement.   02:16: CONSULT: Discussed with hospitalist Dr. Toniann Fail- accepts admission.    Results for orders placed or performed during the hospital encounter of  07/25/20  CBC with Differential  Result Value Ref Range   WBC 10.6 (H) 4.0 - 10.5 K/uL   RBC 4.07 (L) 4.22 - 5.81 MIL/uL   Hemoglobin 13.0 13.0 - 17.0 g/dL   HCT 14.7 (L) 82.9 - 56.2 %   MCV 93.9 80.0 - 100.0 fL   MCH 31.9 26.0 - 34.0 pg   MCHC 34.0 30.0 - 36.0 g/dL   RDW 13.0 86.5 - 78.4 %   Platelets 277 150 - 400 K/uL   nRBC 0.0 0.0 - 0.2 %   Neutrophils Relative % 76 %   Neutro Abs 8.0 (H) 1.7 - 7.7 K/uL   Lymphocytes Relative 17 %   Lymphs Abs 1.8 0.7 - 4.0 K/uL   Monocytes Relative 5 %   Monocytes Absolute 0.5 0.1 - 1.0 K/uL   Eosinophils Relative 1 %   Eosinophils Absolute 0.1 0.0 - 0.5 K/uL   Basophils Relative 0 %   Basophils Absolute 0.0 0.0 - 0.1 K/uL   Immature Granulocytes 1 %   Abs Immature Granulocytes 0.15 (H) 0.00 - 0.07 K/uL  Comprehensive metabolic panel  Result Value Ref Range   Sodium 130 (L) 135 - 145 mmol/L   Potassium 3.7 3.5 - 5.1 mmol/L   Chloride 95 (L) 98 - 111 mmol/L   CO2 23 22 - 32 mmol/L   Glucose, Bld 164 (H) 70 - 99 mg/dL   BUN 12 8 - 23 mg/dL   Creatinine, Ser 6.96 0.61 - 1.24 mg/dL   Calcium 9.3 8.9 - 29.5 mg/dL   Total Protein 7.3 6.5 - 8.1 g/dL  Albumin 4.1 3.5 - 5.0 g/dL   AST 23 15 - 41 U/L   ALT 19 0 - 44 U/L   Alkaline Phosphatase 68 38 - 126 U/L   Total Bilirubin 0.9 0.3 - 1.2 mg/dL   GFR, Estimated >16 >10 mL/min   Anion gap 12 5 - 15  Lipase, blood  Result Value Ref Range   Lipase 28 11 - 51 U/L   CT ABDOMEN PELVIS W CONTRAST  Result Date: 07/26/2020 CLINICAL DATA:  Abdominal pain for several hours EXAM: CT ABDOMEN AND PELVIS WITH CONTRAST TECHNIQUE: Multidetector CT imaging of the abdomen and pelvis was performed using the standard protocol following bolus administration of intravenous contrast. CONTRAST:  OMNIPAQUE IOHEXOL 300 MG/ML  SOLN COMPARISON:  None. FINDINGS: Lower chest: Lung bases are free of acute infiltrate or sizable effusion. Large hiatal hernia is noted. Hepatobiliary: No focal liver abnormality is  seen. No gallstones, gallbladder wall thickening, or biliary dilatation. Pancreas: Unremarkable. No pancreatic ductal dilatation or surrounding inflammatory changes. Spleen: Normal in size without focal abnormality. Adrenals/Urinary Tract: Adrenal glands are within normal limits. Kidneys demonstrate a normal enhancement pattern bilaterally normal excretion is noted on delayed images. No ureteral stones are seen. The bladder is well distended. Stomach/Bowel: Scattered diverticular change of the colon is noted without evidence of diverticulitis. A loop of sigmoid colon is noted within a left inguinal hernia without incarceration. The more proximal colon appears within normal limits. The appendix is within normal limits. Small bowel demonstrates multiple dilated loops of jejunum and ileum with a transition zone in the left lower quadrant although no definitive mass is seen. Some inflammatory changes in the distal ileum are seen which likely contribute to the more proximal obstructive process. The stomach is decompressed. Large hiatal hernia is again noted. Vascular/Lymphatic: Aortic atherosclerosis. No enlarged abdominal or pelvic lymph nodes. Reproductive: Prostate is unremarkable. Other: Left inguinal hernia is noted with fluid and a loop of sigmoid colon within. No incarceration is noted. Musculoskeletal: Chronic T12 compression deformity is noted. No acute bony abnormality is seen. IMPRESSION: Multiple dilated loops of small bowel with a transition zone in the left lower quadrant likely secondary to inflammatory changes in the distal ileum. These changes are consistent with a partial small bowel obstruction. Diverticulosis without diverticulitis. Large hiatal hernia. Left inguinal hernia with a loop of sigmoid colon within although no obstructive changes are noted. Electronically Signed   By: Alcide Clever M.D.   On: 07/26/2020 00:57         Cherly Anderson, PA-C 07/26/20 [id number]    Little, Ambrose Finland, MD 07/26/20 [phone]

## 2020-07-25 NOTE — ED Triage Notes (Signed)
Pt BIB EMS Per EMS abdominal pain has been going on for 8 hrs, both lower quadrants, pain does not radiate. Does not hurt upon palpation. Pt lives at home in [place]. Hx of abdominal hernia and constipation, reports that the pain today is not like his Hx of abdominal pain.   Vitals 142/84 68 p 16 r 97 % room air  cbg 169 98.2 f

## 2020-07-25 NOTE — ED Provider Notes (Signed)
[facility]-EMERGENCY DEPT Provider Note   CSN: [id number] Arrival date & time: 07/25/20  2053     History Chief Complaint  Patient presents with  . Abdominal Pain    Russell Ayers is a 87 y.o. male history of diabetes, anxiety, hypertension, inguinal hernia.  Patient presents today for evaluation of abdominal pain.  Patient reports left lower abdominal pain and left inguinal pain he reports is due to his hernia.  He reports he feels the area is swollen describes pain as moderate pressure sensation has been ongoing since this morning no clear aggravating or relieving factors no radiation of pain.  Denies fever/chills, vomiting, diarrhea, dysuria/hematuria, testicular pain/swelling or any additional concerns.  HPI     Past Medical History:  Diagnosis Date  . Diabetes mellitus without complication (HCC)   . Generalized anxiety disorder   . Glaucoma   . Hypertension   . Inguinal hernia   . Insomnia   . Prostatism     Patient Active Problem List   Diagnosis Date Noted  . Diabetic neuropathy (HCC) 04/30/2020  . Pain due to onychomycosis of toenails of both feet 11/29/2018    Past Surgical History:  Procedure Laterality Date  . CATARACT EXTRACTION, BILATERAL    . glaucoma         Family History  Problem Relation Age of Onset  . Cancer Brother   . Heart attack Brother     Social History   Tobacco Use  . Smoking status: Former Smoker    Quit date: 02/18/1989    Years since quitting: 31.4  . Smokeless tobacco: Never Used  Substance Use Topics  . Alcohol use: Never  . Drug use: Never    Home Medications Prior to Admission medications   Medication Sig Start Date End Date Taking? Authorizing Provider  amoxicillin (AMOXIL) 875 MG tablet Take 875 mg by mouth 2 (two) times daily. 10/29/19   [provider]  benazepril (LOTENSIN) 10 MG tablet Take 10 mg by mouth daily. 10/20/19   [provider]  benazepril (LOTENSIN) 5 MG  tablet Take 5 mg by mouth daily. 11/13/18   [provider]  Calcium Citrate-Vitamin D (CALCIUM CITRATE + PO) Take by mouth.    [provider]  cetirizine (ZYRTEC) 10 MG chewable tablet Chew 10 mg by mouth daily.    [provider]  finasteride (PROSCAR) 5 MG tablet Take 5 mg by mouth daily. 09/30/18   [provider]  FISH OIL-KRILL OIL PO Take by mouth.    [provider]  fluticasone (FLONASE) 50 MCG/ACT nasal spray Place into both nostrils daily.    [provider]  hydrochlorothiazide (HYDRODIURIL) 12.5 MG tablet Take 12.5 mg by mouth daily. 09/21/18   [provider]  HYDROcodone-acetaminophen (NORCO) 7.5-325 MG tablet Take 1 tablet by mouth 3 (three) times daily as needed. 10/28/19   [provider]  latanoprost (XALATAN) 0.005 % ophthalmic solution INSTILL 1 DROP IN [facility] AT BEDTIME 11/20/18   [provider]  LORazepam (ATIVAN) 2 MG tablet Take 2 mg by mouth 2 (two) times daily.  11/19/18   [provider]  MELATONIN ER PO Take by mouth.    [provider]  PSYLLIUM HUSK PO Take by mouth 3 (three) times daily.     [provider]  Specialty Vitamins Products (PROSTATE PO) Take by mouth. Essentials    [provider]  temazepam (RESTORIL) 30 MG capsule TAKE 1 CAPSULE BY MOUTH EVERY  NIGHT FOR SLEEP DO NOT TAKE WITHIN 8 HOURS OF LORAZEPAM 11/19/18   [provider]  terazosin (HYTRIN) 1 MG capsule TAKE 1 CAPSULE EVERY NIGHT FOR BPH 09/30/18   [provider]  timolol (TIMOPTIC) 0.5 % ophthalmic solution INSTILL 1 DROP INTO BOTH EYES TWICE A DAY 09/20/18   [provider]    Allergies    Patient has no known allergies.  Review of Systems   Review of Systems Ten systems are reviewed and are negative for acute change except as noted in the HPI  Physical Exam Updated Vital Signs BP (!) 149/108 (BP Location: Left Arm)   Pulse 98   Temp 98.4 F (36.9 C)  (Oral)   Resp 15   Ht 5\' 11"  (1.803 m)   Wt 75.3 kg   SpO2 99%   BMI 23.15 kg/m   Physical Exam Constitutional:      General: He is not in acute distress.    Appearance: Normal appearance. He is well-developed. He is not ill-appearing or diaphoretic.  HENT:     Head: Normocephalic and atraumatic.  Eyes:     General: Vision grossly intact. Gaze aligned appropriately.     Pupils: Pupils are equal, round, and reactive to light.  Neck:     Trachea: Trachea and phonation normal.  Pulmonary:     Effort: Pulmonary effort is normal. No respiratory distress.  Abdominal:     General: There is no distension.     Palpations: Abdomen is soft.     Tenderness: There is abdominal tenderness in the left lower quadrant. There is no guarding or rebound.    Genitourinary:    Comments: Chaperone present during genital exam Franco NT.  No external genital lesions noted.  No swelling of the scrotum or testicles.  Moderate swelling along bilateral inguinal canals and pannus no overlying skin changes. Musculoskeletal:        General: Normal range of motion.     Cervical back: Normal range of motion.  Skin:    General: Skin is warm and dry.  Neurological:     Mental Status: He is alert.     GCS: GCS eye subscore is 4. GCS verbal subscore is 5. GCS motor subscore is 6.     Comments: Speech is clear and goal oriented, follows commands Major Cranial nerves without deficit, no facial droop Moves extremities without ataxia, coordination intact  Psychiatric:        Behavior: Behavior normal.     ED Results / Procedures / Treatments   Labs (all labs ordered are listed, but only abnormal results are displayed) Labs Reviewed - No data to display  EKG None  Radiology No results found.  Procedures Procedures   Medications Ordered in ED Medications - No data to display  ED Course  I have reviewed the triage vital signs and the nursing notes.  Pertinent labs & imaging results that were  available during my care of the patient were reviewed by me and considered in my medical decision making (see chart for details).    MDM Rules/Calculators/A&P                         Additional history obtained from: 1. Nursing notes from this visit. 2. Review of electronic medical records. --------- 87 year old male presented today for evaluation of lower abdominal pain left worse than right he is also concerned for possible swelling along his inguinal canals.  No testicular pain or  swelling.  He has mild left lower quadrant abdominal tenderness, questionable swelling along the bilateral inguinal canals and pannus however this may be due to body habitus.  No overlying skin changes.  Will obtain abdominal pain lab work give IV pain medication and fluids and obtain CT abdomen pelvis for further evaluation --------------- I ordered, reviewed and interpreted labs which include: CBC shows mild leukocytosis of 10.6, no anemia. CMP shows hyponatremia 130, hypochloremia 95 will give IV fluids.  No AKI LFT elevations or gap. Lipase within normal limits. Urinalysis pending.  CT abdomen pelvis pending.  Care handoff given to [facility], PA-C at shift change.  Plan of care is to follow-up on urinalysis and CT abdomen pelvis and reassessment.  Pending no emergent findings possible discharge.  Final disposition per oncoming team.  Note: Portions of this report may have been transcribed using voice recognition software. Every effort was made to ensure accuracy; however, inadvertent computerized transcription errors may still be present. Final Clinical Impression(s) / ED Diagnoses Final diagnoses:  None    Rx / DC Orders ED Discharge Orders    None       Elizabeth Palau 07/25/20 2353    Little, Ambrose Finland, MD 07/26/20 [phone]

## 2020-07-26 ENCOUNTER — Encounter (HOSPITAL_COMMUNITY): Payer: Self-pay | Admitting: Internal Medicine

## 2020-07-26 ENCOUNTER — Emergency Department (HOSPITAL_COMMUNITY): Payer: Medicare Other

## 2020-07-26 ENCOUNTER — Other Ambulatory Visit: Payer: Self-pay

## 2020-07-26 DIAGNOSIS — K56600 Partial intestinal obstruction, unspecified as to cause: Secondary | ICD-10-CM | POA: Diagnosis present

## 2020-07-26 DIAGNOSIS — G9341 Metabolic encephalopathy: Secondary | ICD-10-CM | POA: Diagnosis not present

## 2020-07-26 DIAGNOSIS — N390 Urinary tract infection, site not specified: Secondary | ICD-10-CM | POA: Diagnosis present

## 2020-07-26 DIAGNOSIS — F13239 Sedative, hypnotic or anxiolytic dependence with withdrawal, unspecified: Secondary | ICD-10-CM | POA: Diagnosis not present

## 2020-07-26 DIAGNOSIS — N400 Enlarged prostate without lower urinary tract symptoms: Secondary | ICD-10-CM | POA: Diagnosis present

## 2020-07-26 DIAGNOSIS — K4090 Unilateral inguinal hernia, without obstruction or gangrene, not specified as recurrent: Secondary | ICD-10-CM | POA: Diagnosis present

## 2020-07-26 DIAGNOSIS — F411 Generalized anxiety disorder: Secondary | ICD-10-CM | POA: Diagnosis present

## 2020-07-26 DIAGNOSIS — E119 Type 2 diabetes mellitus without complications: Secondary | ICD-10-CM | POA: Diagnosis present

## 2020-07-26 DIAGNOSIS — K56609 Unspecified intestinal obstruction, unspecified as to partial versus complete obstruction: Secondary | ICD-10-CM | POA: Diagnosis present

## 2020-07-26 DIAGNOSIS — I10 Essential (primary) hypertension: Secondary | ICD-10-CM | POA: Diagnosis present

## 2020-07-26 DIAGNOSIS — E871 Hypo-osmolality and hyponatremia: Secondary | ICD-10-CM | POA: Diagnosis present

## 2020-07-26 DIAGNOSIS — G4700 Insomnia, unspecified: Secondary | ICD-10-CM | POA: Diagnosis present

## 2020-07-26 DIAGNOSIS — Z79899 Other long term (current) drug therapy: Secondary | ICD-10-CM | POA: Diagnosis not present

## 2020-07-26 DIAGNOSIS — R569 Unspecified convulsions: Secondary | ICD-10-CM | POA: Diagnosis not present

## 2020-07-26 DIAGNOSIS — G40901 Epilepsy, unspecified, not intractable, with status epilepticus: Secondary | ICD-10-CM | POA: Diagnosis not present

## 2020-07-26 DIAGNOSIS — R1032 Left lower quadrant pain: Secondary | ICD-10-CM | POA: Diagnosis not present

## 2020-07-26 DIAGNOSIS — Z8249 Family history of ischemic heart disease and other diseases of the circulatory system: Secondary | ICD-10-CM | POA: Diagnosis not present

## 2020-07-26 DIAGNOSIS — Z87891 Personal history of nicotine dependence: Secondary | ICD-10-CM | POA: Diagnosis not present

## 2020-07-26 DIAGNOSIS — E861 Hypovolemia: Secondary | ICD-10-CM | POA: Diagnosis present

## 2020-07-26 DIAGNOSIS — G8384 Todd's paralysis (postepileptic): Secondary | ICD-10-CM | POA: Diagnosis not present

## 2020-07-26 DIAGNOSIS — H409 Unspecified glaucoma: Secondary | ICD-10-CM | POA: Diagnosis present

## 2020-07-26 DIAGNOSIS — F05 Delirium due to known physiological condition: Secondary | ICD-10-CM | POA: Diagnosis not present

## 2020-07-26 DIAGNOSIS — E876 Hypokalemia: Secondary | ICD-10-CM | POA: Diagnosis present

## 2020-07-26 DIAGNOSIS — D649 Anemia, unspecified: Secondary | ICD-10-CM | POA: Diagnosis present

## 2020-07-26 DIAGNOSIS — Z20822 Contact with and (suspected) exposure to covid-19: Secondary | ICD-10-CM | POA: Diagnosis present

## 2020-07-26 HISTORY — DX: Unspecified intestinal obstruction, unspecified as to partial versus complete obstruction: K56.609

## 2020-07-26 LAB — COMPREHENSIVE METABOLIC PANEL
ALT: 17 U/L (ref 0–44)
AST: 18 U/L (ref 15–41)
Albumin: 3.5 g/dL (ref 3.5–5.0)
Alkaline Phosphatase: 60 U/L (ref 38–126)
Anion gap: 9 (ref 5–15)
BUN: 13 mg/dL (ref 8–23)
CO2: 22 mmol/L (ref 22–32)
Calcium: 8.5 mg/dL — ABNORMAL LOW (ref 8.9–10.3)
Chloride: 101 mmol/L (ref 98–111)
Creatinine, Ser: 0.76 mg/dL (ref 0.61–1.24)
GFR, Estimated: >60 mL/min (ref >60)
Glucose, Bld: 141 mg/dL — ABNORMAL HIGH (ref 70–99)
Potassium: 3.4 mmol/L — ABNORMAL LOW (ref 3.5–5.1)
Sodium: 132 mmol/L — ABNORMAL LOW (ref 135–145)
Total Bilirubin: 0.8 mg/dL (ref 0.3–1.2)
Total Protein: 6.3 g/dL — ABNORMAL LOW (ref 6.5–8.1)

## 2020-07-26 LAB — GLUCOSE, CAPILLARY
Glucose-Capillary: 109 mg/dL — ABNORMAL HIGH (ref 70–99)
Glucose-Capillary: 112 mg/dL — ABNORMAL HIGH (ref 70–99)
Glucose-Capillary: 113 mg/dL — ABNORMAL HIGH (ref 70–99)
Glucose-Capillary: 166 mg/dL — ABNORMAL HIGH (ref 70–99)
Glucose-Capillary: 99 mg/dL (ref 70–99)

## 2020-07-26 LAB — URINALYSIS, ROUTINE W REFLEX MICROSCOPIC
Bacteria, UA: NONE SEEN
Bilirubin Urine: NEGATIVE
Glucose, UA: NEGATIVE mg/dL
Ketones, ur: 5 mg/dL — AB
Leukocytes,Ua: NEGATIVE
Nitrite: NEGATIVE
Protein, ur: NEGATIVE mg/dL
Specific Gravity, Urine: 1.021 (ref 1.005–1.030)
pH: 7 (ref 5.0–8.0)

## 2020-07-26 LAB — CBC
HCT: 35.8 % — ABNORMAL LOW (ref 39.0–52.0)
Hemoglobin: 12 g/dL — ABNORMAL LOW (ref 13.0–17.0)
MCH: 31.8 pg (ref 26.0–34.0)
MCHC: 33.5 g/dL (ref 30.0–36.0)
MCV: 95 fL (ref 80.0–100.0)
Platelets: 238 10*3/uL (ref 150–400)
RBC: 3.77 MIL/uL — ABNORMAL LOW (ref 4.22–5.81)
RDW: 12.7 % (ref 11.5–15.5)
WBC: 10.4 10*3/uL (ref 4.0–10.5)
nRBC: 0 % (ref 0.0–0.2)

## 2020-07-26 LAB — RESP PANEL BY RT-PCR (FLU A&B, COVID) ARPGX2
Influenza A by PCR: NEGATIVE
Influenza B by PCR: NEGATIVE
SARS Coronavirus 2 by RT PCR: NEGATIVE

## 2020-07-26 MED ORDER — TRAZODONE HCL 50 MG PO TABS
50.0000 mg | ORAL_TABLET | Freq: Every evening | ORAL | Status: DC | PRN
  Administered 2020-07-26 – 2020-07-28 (×2): 50 mg via ORAL
  Filled 2020-07-26 (×2): qty 1

## 2020-07-26 MED ORDER — MORPHINE SULFATE (PF) 2 MG/ML IV SOLN: 1.0000 mg | INTRAVENOUS | Status: DC | PRN

## 2020-07-26 MED ORDER — POTASSIUM CHLORIDE 10 MEQ/100ML IV SOLN
10.0000 meq | INTRAVENOUS | Status: AC
  Administered 2020-07-26 (×3): 10 meq via INTRAVENOUS
  Filled 2020-07-26: qty 100

## 2020-07-26 MED ORDER — ACETAMINOPHEN 650 MG RE SUPP: 650.0000 mg | Freq: Four times a day (QID) | RECTAL | Status: DC | PRN

## 2020-07-26 MED ORDER — HYDRALAZINE HCL 20 MG/ML IJ SOLN
10.0000 mg | INTRAMUSCULAR | Status: DC | PRN
  Administered 2020-07-28: 10 mg via INTRAVENOUS
  Filled 2020-07-26: qty 1

## 2020-07-26 MED ORDER — SODIUM CHLORIDE 0.9 % IV SOLN: INTRAVENOUS | Status: AC

## 2020-07-26 MED ORDER — TIMOLOL MALEATE 0.5 % OP SOLN
1.0000 [drp] | Freq: Two times a day (BID) | OPHTHALMIC | Status: DC
  Administered 2020-07-26 – 2020-08-03 (×16): 1 [drp] via OPHTHALMIC
  Filled 2020-07-26: qty 5

## 2020-07-26 MED ORDER — ACETAMINOPHEN 325 MG PO TABS: 650.0000 mg | ORAL_TABLET | Freq: Four times a day (QID) | ORAL | Status: DC | PRN

## 2020-07-26 MED ORDER — LATANOPROST 0.005 % OP SOLN
1.0000 [drp] | Freq: Every day | OPHTHALMIC | Status: DC
  Administered 2020-07-26 – 2020-08-02 (×8): 1 [drp] via OPHTHALMIC
  Filled 2020-07-26: qty 2.5

## 2020-07-26 NOTE — H&P (Signed)
History and Physical    Russell Ayers CBU:[id number] DOB: Sep 05, 1932 DOA: 07/25/2020  PCP: Soundra Pilon, FNP  Patient coming from: Home.  Chief Complaint: Abdominal pain.  HPI: Russell Ayers is a 87 y.o. male with history of hypertension and prediabetes presents to the ER with complaints of abdominal pain.  Patient's pain started around noontime yesterday mostly in the left lower quadrant.  Had some nausea denies any vomiting.  Last bowel movement was yesterday morning which was normal.  Has not had any flatus after the pain started.  ED Course: In the ER patient had CT scan of the abdomen pelvis which shows features concerning for small bowel obstruction with transition point.  There is also a left inguinal hernia which does not look obstructed.  Labs show hyponatremia with sodium of 130.  Covid test is negative patient admitted for further observation.  Review of Systems: As per HPI, rest all negative.   Past Medical History:  Diagnosis Date  . Diabetes mellitus without complication (HCC)   . Generalized anxiety disorder   . Glaucoma   . Hypertension   . Inguinal hernia   . Insomnia   . Prostatism     Past Surgical History:  Procedure Laterality Date  . CATARACT EXTRACTION, BILATERAL    . glaucoma       reports that he quit smoking about 31 years ago. He has never used smokeless tobacco. He reports that he does not drink alcohol and does not use drugs.  No Known Allergies  Family History  Problem Relation Age of Onset  . Cancer Brother   . Heart attack Brother     Prior to Admission medications   Medication Sig Start Date End Date Taking? Authorizing Provider  amoxicillin (AMOXIL) 875 MG tablet Take 875 mg by mouth 2 (two) times daily. 10/29/19   [provider]  benazepril (LOTENSIN) 10 MG tablet Take 10 mg by mouth daily. 10/20/19   [provider]  benazepril (LOTENSIN) 5 MG tablet Take 5 mg by mouth daily. 11/13/18   [provider]   Calcium Citrate-Vitamin D (CALCIUM CITRATE + PO) Take by mouth.    [provider]  cetirizine (ZYRTEC) 10 MG chewable tablet Chew 10 mg by mouth daily.    [provider]  finasteride (PROSCAR) 5 MG tablet Take 5 mg by mouth daily. 09/30/18   [provider]  FISH OIL-KRILL OIL PO Take by mouth.    [provider]  fluticasone (FLONASE) 50 MCG/ACT nasal spray Place into both nostrils daily.    [provider]  hydrochlorothiazide (HYDRODIURIL) 12.5 MG tablet Take 12.5 mg by mouth daily. 09/21/18   [provider]  HYDROcodone-acetaminophen (NORCO) 7.5-325 MG tablet Take 1 tablet by mouth 3 (three) times daily as needed. 10/28/19   [provider]  latanoprost (XALATAN) 0.005 % ophthalmic solution INSTILL 1 DROP IN [facility] AT BEDTIME 11/20/18   [provider]  LORazepam (ATIVAN) 2 MG tablet Take 2 mg by mouth 2 (two) times daily.  11/19/18   [provider]  MELATONIN ER PO Take by mouth.    [provider]  PSYLLIUM HUSK PO Take by mouth 3 (three) times daily.     [provider]  Specialty Vitamins Products (PROSTATE PO) Take by mouth. Essentials    [provider]  temazepam (RESTORIL) 30 MG capsule TAKE 1 CAPSULE BY MOUTH EVERY NIGHT FOR SLEEP DO NOT TAKE WITHIN 8 HOURS OF LORAZEPAM 11/19/18  [provider]  terazosin (HYTRIN) 1 MG capsule TAKE 1 CAPSULE EVERY NIGHT FOR BPH 09/30/18   [provider]  timolol (TIMOPTIC) 0.5 % ophthalmic solution INSTILL 1 DROP INTO BOTH EYES TWICE A DAY 09/20/18   [provider]    Physical Exam: Constitutional: Moderately built and nourished. Vitals:   07/26/20 0006 07/26/20 0100 07/26/20 0315 07/26/20 0403  BP: (!) 142/71 (!) 147/73 127/64 137/68  Pulse: 98 97 98 90  Resp: 16 12 14 18   Temp:    98.9 F (37.2 C)  TempSrc:    Oral  SpO2: 100% 97% 100% 100%  Weight:      Height:       Eyes: Anicteric no pallor. ENMT: No  discharge from the ears eyes nose or mouth. Neck: No mass felt.  No neck rigidity. Respiratory: No rhonchi or crepitations. Cardiovascular: S1-S2 heard. Abdomen: Left inguinal hernia seen appears nonobstructed.  Bowel sounds not appreciated.  Mildly distended.  No guarding or rigidity. Musculoskeletal: No edema. Skin: No rash. Neurologic: Alert awake oriented to time place and person.  Moves all extremities. Psychiatric: Appears normal.  Normal affect.   Labs on Admission: I have personally reviewed following labs and imaging studies  CBC: Recent Labs  Lab 07/25/20 2126  WBC 10.6*  NEUTROABS 8.0*  HGB 13.0  HCT 38.2*  MCV 93.9  PLT 277   Basic Metabolic Panel: Recent Labs  Lab 07/25/20 2126  NA 130*  K 3.7  CL 95*  CO2 23  GLUCOSE 164*  BUN 12  CREATININE 0.88  CALCIUM 9.3   GFR: Estimated Creatinine Clearance: 64.2 mL/min (by C-G formula based on SCr of 0.88 mg/dL). Liver Function Tests: Recent Labs  Lab 07/25/20 2126  AST 23  ALT 19  ALKPHOS 68  BILITOT 0.9  PROT 7.3  ALBUMIN 4.1   Recent Labs  Lab 07/25/20 2126  LIPASE 28   No results for input(s): AMMONIA in the last 168 hours. Coagulation Profile: No results for input(s): INR, PROTIME in the last 168 hours. Cardiac Enzymes: No results for input(s): CKTOTAL, CKMB, CKMBINDEX, TROPONINI in the last 168 hours. BNP (last 3 results) No results for input(s): PROBNP in the last 8760 hours. HbA1C: No results for input(s): HGBA1C in the last 72 hours. CBG: No results for input(s): GLUCAP in the last 168 hours. Lipid Profile: No results for input(s): CHOL, HDL, LDLCALC, TRIG, CHOLHDL, LDLDIRECT in the last 72 hours. Thyroid Function Tests: No results for input(s): TSH, T4TOTAL, FREET4, T3FREE, THYROIDAB in the last 72 hours. Anemia Panel: No results for input(s): VITAMINB12, FOLATE, FERRITIN, TIBC, IRON, RETICCTPCT in the last 72 hours. Urine analysis: No results found for: COLORURINE, APPEARANCEUR,  LABSPEC, PHURINE, GLUCOSEU, HGBUR, BILIRUBINUR, KETONESUR, PROTEINUR, UROBILINOGEN, NITRITE, LEUKOCYTESUR Sepsis Labs: @LABRCNTIP (procalcitonin:4,lacticidven:4) ) Recent Results (from the past 240 hour(s))  Resp Panel by RT-PCR (Flu A&B, Covid) Nasopharyngeal Swab     Status: None   Collection Time: 07/26/20  2:07 AM   Specimen: Nasopharyngeal Swab; Nasopharyngeal(NP) swabs in vial transport medium  Result Value Ref Range Status   SARS Coronavirus 2 by RT PCR NEGATIVE NEGATIVE Final    Comment: (NOTE) SARS-CoV-2 target nucleic acids are NOT DETECTED.  The SARS-CoV-2 RNA is generally detectable in upper respiratory specimens during the acute phase of infection. The lowest concentration of SARS-CoV-2 viral copies this assay can detect is 138 copies/mL. A negative result does not preclude SARS-Cov-2 infection and should not be used as the sole basis for treatment or other patient  management decisions. A negative result may occur with  improper specimen collection/handling, submission of specimen other than nasopharyngeal swab, presence of viral mutation(s) within the areas targeted by this assay, and inadequate number of viral copies(<138 copies/mL). A negative result must be combined with clinical observations, patient history, and epidemiological information. The expected result is Negative.  Fact Sheet for Patients:  BloggerCourse.com  Fact Sheet for Healthcare Providers:  SeriousBroker.it  This test is no t yet approved or cleared by the [place] FDA and  has been authorized for detection and/or diagnosis of SARS-CoV-2 by FDA under an Emergency Use Authorization (EUA). This EUA will remain  in effect (meaning this test can be used) for the duration of the COVID-19 declaration under Section 564(b)(1) of the Act, 21 U.S.C.section 360bbb-3(b)(1), unless the authorization is terminated  or revoked sooner.       Influenza A  by PCR NEGATIVE NEGATIVE Final   Influenza B by PCR NEGATIVE NEGATIVE Final    Comment: (NOTE) The Xpert Xpress SARS-CoV-2/FLU/RSV plus assay is intended as an aid in the diagnosis of influenza from Nasopharyngeal swab specimens and should not be used as a sole basis for treatment. Nasal washings and aspirates are unacceptable for Xpert Xpress SARS-CoV-2/FLU/RSV testing.  Fact Sheet for Patients: BloggerCourse.com  Fact Sheet for Healthcare Providers: SeriousBroker.it  This test is not yet approved or cleared by the [place] FDA and has been authorized for detection and/or diagnosis of SARS-CoV-2 by FDA under an Emergency Use Authorization (EUA). This EUA will remain in effect (meaning this test can be used) for the duration of the COVID-19 declaration under Section 564(b)(1) of the Act, 21 U.S.C. section 360bbb-3(b)(1), unless the authorization is terminated or revoked.  Performed at [facility], [address] [place], [place] [address]      Radiological Exams on Admission: CT ABDOMEN PELVIS W CONTRAST  Result Date: 07/26/2020 CLINICAL DATA:  Abdominal pain for several hours EXAM: CT ABDOMEN AND PELVIS WITH CONTRAST TECHNIQUE: Multidetector CT imaging of the abdomen and pelvis was performed using the standard protocol following bolus administration of intravenous contrast. CONTRAST:  OMNIPAQUE IOHEXOL 300 MG/ML  SOLN COMPARISON:  None. FINDINGS: Lower chest: Lung bases are free of acute infiltrate or sizable effusion. Large hiatal hernia is noted. Hepatobiliary: No focal liver abnormality is seen. No gallstones, gallbladder wall thickening, or biliary dilatation. Pancreas: Unremarkable. No pancreatic ductal dilatation or surrounding inflammatory changes. Spleen: Normal in size without focal abnormality. Adrenals/Urinary Tract: Adrenal glands are within normal limits. Kidneys demonstrate a normal  enhancement pattern bilaterally normal excretion is noted on delayed images. No ureteral stones are seen. The bladder is well distended. Stomach/Bowel: Scattered diverticular change of the colon is noted without evidence of diverticulitis. A loop of sigmoid colon is noted within a left inguinal hernia without incarceration. The more proximal colon appears within normal limits. The appendix is within normal limits. Small bowel demonstrates multiple dilated loops of jejunum and ileum with a transition zone in the left lower quadrant although no definitive mass is seen. Some inflammatory changes in the distal ileum are seen which likely contribute to the more proximal obstructive process. The stomach is decompressed. Large hiatal hernia is again noted. Vascular/Lymphatic: Aortic atherosclerosis. No enlarged abdominal or pelvic lymph nodes. Reproductive: Prostate is unremarkable. Other: Left inguinal hernia is noted with fluid and a loop of sigmoid colon within. No incarceration is noted. Musculoskeletal: Chronic T12 compression deformity is noted. No acute bony abnormality is seen. IMPRESSION: Multiple dilated  loops of small bowel with a transition zone in the left lower quadrant likely secondary to inflammatory changes in the distal ileum. These changes are consistent with a partial small bowel obstruction. Diverticulosis without diverticulitis. Large hiatal hernia. Left inguinal hernia with a loop of sigmoid colon within although no obstructive changes are noted. Electronically Signed   By: Alcide Clever M.D.   On: 07/26/2020 00:57     Assessment/Plan Principal Problem:   SBO (small bowel obstruction) (HCC) Active Problems:   Essential hypertension    1. Small bowel obstruction -we will keep patient n.p.o. consult general surgery IV fluids pain medication.  KUB in a.m. 2. Hypertension we will keep patient on as needed IV hydralazine while patient n.p.o. 3. History of prediabetes we will check CBGs every 4  hourly for now. 4. Hyponatremia could be from dehydration follow metabolic panel closely. 5. Left inguinal hernia per CAT scan no obstruction seen.  Since patient has small bowel obstruction will need close monitoring for any further worsening in inpatient status.   DVT prophylaxis: SCDs.  Avoiding anticoagulation anticipation of possible procedure. Code Status: Full code. Family Communication: Discussed with patient. Disposition Plan: Home. Consults called: We will consult general surgery. Admission status: Inpatient.   Eduard Clos MD [facility] Pager [phone].  If 7PM-7AM, please contact night-coverage [email or web address] Password [email or web address]  07/26/2020, 4:20 AM

## 2020-07-26 NOTE — Progress Notes (Signed)
[facility] PROGRESS NOTE   Russell Ayers UXN:[id number] DOB: 08/31/1932 DOA: 07/25/2020  PCP: Soundra Pilon, FNP  Brief History/Interval Summary: 87 y.o. male with history of hypertension and prediabetes presents to the ER with complaints of abdominal pain. CT scan revealed small bowel obstruction. Patient was hospitalized for further management.  Consultants: General surgery  Procedures: None yet  Antibiotics: Anti-infectives (From admission, onward)   None      Subjective/Interval History: Patient is a poor historian. Complains of abdominal pain but unable to specify any further. Denies any nausea vomiting. Nursing staff reported that he has had bowel movements.     Assessment/Plan:  Small bowel obstruction CT scan raised concern for transition point in the terminal ileum. Concern raised for inflammatory changes in the distal ileum. However patient is not really tender in the right lower quadrant. Patient reports never having had any colonoscopy previously. Patient apparently has had bowel movements this morning and overnight. Discussed with general surgery. Hold off on NG tube for now. May need to repeat imaging studies.  Left-sided inguinal hernia Apparently has seen general surgery in the outpatient setting for same. No evidence for bowel incarceration at this time.  Essential hypertension On IV hydralazine as needed. Monitor blood pressures closely.  Hyponatremia and hypokalemia Likely due to hypovolemia. Replace potassium. Check magnesium.  Normocytic anemia No evidence of overt bleeding. Continue to monitor.  History of glaucoma Continue with eyedrops.  DVT Prophylaxis: SCDs Code Status: Full code Family Communication: No family at bedside Disposition Plan: Hopefully return home when improved and mobilize. PT and OT evaluation.  Status is: Inpatient  Remains inpatient appropriate because:Ongoing diagnostic testing needed not appropriate for  outpatient work up, IV treatments appropriate due to intensity of illness or inability to take PO and Inpatient level of care appropriate due to severity of illness   Dispo: The patient is from: Home              Anticipated d/c is to: Home              Patient currently is not medically stable to d/c.   Difficult to place patient No      Medications:  Scheduled: . latanoprost  1 drop Both Eyes QHS  . timolol  1 drop Both Eyes BID   Continuous: . sodium chloride 100 mL/hr at 07/26/20 0455   URK:[id number] **OR** acetaminophen, hydrALAZINE, morphine injection   Objective:  Vital Signs  Vitals:   07/26/20 0100 07/26/20 0315 07/26/20 0403 07/26/20 0928  BP: (!) 147/73 127/64 137/68 (!) 145/59  Pulse: 97 98 90 80  Resp: 12 14 18 16   Temp:   98.9 F (37.2 C) 98.2 F (36.8 C)  TempSrc:   Oral Oral  SpO2: 97% 100% 100% 100%  Weight:      Height:        Intake/Output Summary (Last 24 hours) at 07/26/2020 1051 Last data filed at 07/26/2020 1000 Gross per 24 hour  Intake 1102.01 ml  Output 200 ml  Net 902.01 ml   Filed Weights   07/25/20 2113  Weight: 75.3 kg    General appearance: Awake alert.  In no distress. Mildly distracted Resp: Clear to auscultation bilaterally.  Normal effort Cardio: S1-S2 is normal regular.  No S3-S4.  No rubs murmurs or bruit GI: Abdomen is soft. Left-sided inguinal hernia noted. Tender to palpation in the left lower abdomen. No rebound rigidity or guarding. Bowel sounds present somewhat high-pitched.  Extremities: No edema.  Full range of motion of lower extremities. Neurologic:   No focal neurological deficits.    Lab Results:  Data Reviewed: I have personally reviewed following labs and imaging studies  CBC: Recent Labs  Lab 07/25/20 2126 07/26/20 0454  WBC 10.6* 10.4  NEUTROABS 8.0*  --   HGB 13.0 12.0*  HCT 38.2* 35.8*  MCV 93.9 95.0  PLT 277 238    Basic Metabolic Panel: Recent Labs  Lab 07/25/20 2126  07/26/20 0454  NA 130* 132*  K 3.7 3.4*  CL 95* 101  CO2 23 22  GLUCOSE 164* 141*  BUN 12 13  CREATININE 0.88 0.76  CALCIUM 9.3 8.5*    GFR: Estimated Creatinine Clearance: 70.6 mL/min (by C-G formula based on SCr of 0.76 mg/dL).  Liver Function Tests: Recent Labs  Lab 07/25/20 2126 07/26/20 0454  AST 23 18  ALT 19 17  ALKPHOS 68 60  BILITOT 0.9 0.8  PROT 7.3 6.3*  ALBUMIN 4.1 3.5    Recent Labs  Lab 07/25/20 2126  LIPASE 28    CBG: Recent Labs  Lab 07/26/20 0746  GLUCAP 112*     Recent Results (from the past 240 hour(s))  Resp Panel by RT-PCR (Flu A&B, Covid) Nasopharyngeal Swab     Status: None   Collection Time: 07/26/20  2:07 AM   Specimen: Nasopharyngeal Swab; Nasopharyngeal(NP) swabs in vial transport medium  Result Value Ref Range Status   SARS Coronavirus 2 by RT PCR NEGATIVE NEGATIVE Final    Comment: (NOTE) SARS-CoV-2 target nucleic acids are NOT DETECTED.  The SARS-CoV-2 RNA is generally detectable in upper respiratory specimens during the acute phase of infection. The lowest concentration of SARS-CoV-2 viral copies this assay can detect is 138 copies/mL. A negative result does not preclude SARS-Cov-2 infection and should not be used as the sole basis for treatment or other patient management decisions. A negative result may occur with  improper specimen collection/handling, submission of specimen other than nasopharyngeal swab, presence of viral mutation(s) within the areas targeted by this assay, and inadequate number of viral copies(<138 copies/mL). A negative result must be combined with clinical observations, patient history, and epidemiological information. The expected result is Negative.  Fact Sheet for Patients:  BloggerCourse.com  Fact Sheet for Healthcare Providers:  SeriousBroker.it  This test is no t yet approved or cleared by the [place] FDA and  has been authorized  for detection and/or diagnosis of SARS-CoV-2 by FDA under an Emergency Use Authorization (EUA). This EUA will remain  in effect (meaning this test can be used) for the duration of the COVID-19 declaration under Section 564(b)(1) of the Act, 21 U.S.C.section 360bbb-3(b)(1), unless the authorization is terminated  or revoked sooner.       Influenza A by PCR NEGATIVE NEGATIVE Final   Influenza B by PCR NEGATIVE NEGATIVE Final    Comment: (NOTE) The Xpert Xpress SARS-CoV-2/FLU/RSV plus assay is intended as an aid in the diagnosis of influenza from Nasopharyngeal swab specimens and should not be used as a sole basis for treatment. Nasal washings and aspirates are unacceptable for Xpert Xpress SARS-CoV-2/FLU/RSV testing.  Fact Sheet for Patients: BloggerCourse.com  Fact Sheet for Healthcare Providers: SeriousBroker.it  This test is not yet approved or cleared by the [place] FDA and has been authorized for detection and/or diagnosis of SARS-CoV-2 by FDA under an Emergency Use Authorization (EUA). This EUA will remain in effect (meaning this test can be used) for the duration of the COVID-19 declaration under Section  564(b)(1) of the Act, 21 U.S.C. section 360bbb-3(b)(1), unless the authorization is terminated or revoked.  Performed at [facility], [address], [place], [place] [address]       Radiology Studies: CT ABDOMEN PELVIS W CONTRAST  Result Date: 07/26/2020 CLINICAL DATA:  Abdominal pain for several hours EXAM: CT ABDOMEN AND PELVIS WITH CONTRAST TECHNIQUE: Multidetector CT imaging of the abdomen and pelvis was performed using the standard protocol following bolus administration of intravenous contrast. CONTRAST:  OMNIPAQUE IOHEXOL 300 MG/ML  SOLN COMPARISON:  None. FINDINGS: Lower chest: Lung bases are free of acute infiltrate or sizable effusion. Large hiatal hernia is noted. Hepatobiliary:  No focal liver abnormality is seen. No gallstones, gallbladder wall thickening, or biliary dilatation. Pancreas: Unremarkable. No pancreatic ductal dilatation or surrounding inflammatory changes. Spleen: Normal in size without focal abnormality. Adrenals/Urinary Tract: Adrenal glands are within normal limits. Kidneys demonstrate a normal enhancement pattern bilaterally normal excretion is noted on delayed images. No ureteral stones are seen. The bladder is well distended. Stomach/Bowel: Scattered diverticular change of the colon is noted without evidence of diverticulitis. A loop of sigmoid colon is noted within a left inguinal hernia without incarceration. The more proximal colon appears within normal limits. The appendix is within normal limits. Small bowel demonstrates multiple dilated loops of jejunum and ileum with a transition zone in the left lower quadrant although no definitive mass is seen. Some inflammatory changes in the distal ileum are seen which likely contribute to the more proximal obstructive process. The stomach is decompressed. Large hiatal hernia is again noted. Vascular/Lymphatic: Aortic atherosclerosis. No enlarged abdominal or pelvic lymph nodes. Reproductive: Prostate is unremarkable. Other: Left inguinal hernia is noted with fluid and a loop of sigmoid colon within. No incarceration is noted. Musculoskeletal: Chronic T12 compression deformity is noted. No acute bony abnormality is seen. IMPRESSION: Multiple dilated loops of small bowel with a transition zone in the left lower quadrant likely secondary to inflammatory changes in the distal ileum. These changes are consistent with a partial small bowel obstruction. Diverticulosis without diverticulitis. Large hiatal hernia. Left inguinal hernia with a loop of sigmoid colon within although no obstructive changes are noted. Electronically Signed   By: Alcide Clever M.D.   On: 07/26/2020 00:57       LOS: 0 days   Martice Doty Rito Ehrlich  [facility] Pager on [email or web address]  07/26/2020, 10:51 AM

## 2020-07-26 NOTE — Consult Note (Signed)
Russell Ayers April 05, 1933  [id number].    Requesting MD: Dr. Osvaldo Shipper Chief Complaint/Reason for Consult: SBO  HPI:  This is an 87 yo white male with a history of DM, HTN, glaucoma, and a left inguinal hernia that is usually soft and reducible.  He wears a hernia belt to help with this.  He was in his usual state of health until yesterday, he began having some LLQ abdominal pain around 14:30.  He denies any N/V/abdominal bloating/diarrhea/fevers/chills, etc.  He called his daughter around 1800 due to persistent pain.  He then called EMS who brought him to the [facility] for evaluation. His labs were relatively unremarkable.  He then underwent a CT scan of his abdomen/pelvis that revealed multiple dilated loops of small bowel with a transition zone in the LLQ likely secondary to inflammatory changes in the distal ileum.  These are resultant in a psbo.  He does have a left inguinal hernia with a loop of sigmoid colon, but no SB causing any obstructive symptoms.  The patient has been admitted and has since had 5 BMs per nursing staff.  He still denies N/V and states his abdominal pain has improved.  We are asked to see him secondary to concerning CT findings for a SBO.  ROS: ROS: Please see HPI, otherwise the patient admits to using a cane to mobilize and occasional a WC.  Otherwise all other systems are currently negative.  Family History  Problem Relation Age of Onset  . Cancer Brother   . Heart attack Brother     Past Medical History:  Diagnosis Date  . Diabetes mellitus without complication (HCC)   . Generalized anxiety disorder   . Glaucoma   . Hypertension   . Inguinal hernia   . Insomnia   . Prostatism     Past Surgical History:  Procedure Laterality Date  . CATARACT EXTRACTION, BILATERAL    . glaucoma      Social History:  reports that he quit smoking about 31 years ago. He has never used smokeless tobacco. He reports that he does not drink alcohol and does not use  drugs.  Allergies: No Known Allergies  Medications Prior to Admission  Medication Sig Dispense Refill  . aspirin EC 81 MG tablet Take 162 mg by mouth every 4 (four) hours as needed for mild pain. Swallow whole.    . benazepril (LOTENSIN) 10 MG tablet Take 10 mg by mouth daily.    . cetirizine (ZYRTEC) 10 MG chewable tablet Chew 10-20 mg by mouth See admin instructions. 20mg  in the morning  10mg  at noon 10mg  in the afternoon    . diphenhydrAMINE (BENADRYL) 25 MG tablet Take 25 mg by mouth at bedtime.    . finasteride (PROSCAR) 5 MG tablet Take 5 mg by mouth daily.    . fluticasone (FLONASE) 50 MCG/ACT nasal spray Place 1-2 sprays into both nostrils daily as needed for allergies.    . hydrochlorothiazide (HYDRODIURIL) 12.5 MG tablet Take 12.5 mg by mouth daily.    latanoprost (XALATAN) 0.005 % ophthalmic solution Place 1 drop into both eyes at bedtime.    LORazepam (ATIVAN) 2 MG tablet Take 2 mg by mouth 2 (two) times daily.     . Melatonin 10 MG TABS Take 20-40 mg by mouth See admin instructions. 20mg  every night And 20mg  more if needed for sleep    . PSYLLIUM HUSK PO Take 1 tablet by mouth 3 (three) times daily.     Saw Palmetto, Serenoa repens, (SAW PALMETTO PO) Take 1 tablet by mouth daily.    . temazepam (RESTORIL) 30 MG capsule Take 30 mg by mouth at bedtime.    Marland Kitchen terazosin (HYTRIN) 1 MG capsule Take 1 mg by mouth at bedtime.    . timolol (TIMOPTIC) 0.5 % ophthalmic solution Place 1 drop into both eyes daily.       Physical Exam: Blood pressure (!) 145/59, pulse 80, temperature 98.2 F (36.8 C), temperature source Oral, resp. rate 16, height  (1.803 m), weight 75.3 kg, SpO2 100 %. General: pleasant, elderly, WD, WN white male who is laying in bed in NAD HEENT: head is normocephalic, atraumatic.  Sclera are noninjected.  PERRL.  Ears and nose without any masses or lesions.  Mouth is pink and moist Heart: regular, rate, and rhythm.  Normal s1,s2. No obvious murmurs,  gallops, or rubs noted.  Palpable radial and pedal pulses bilaterally Lungs: CTAB, no wheezes, rhonchi, or rales noted.  Respiratory effort nonlabored Abd: soft, NT, ND, +BS, no masses or organomegaly.  He does have a soft reducible left inguinal hernia noted. MS: all 4 extremities are symmetrical with no cyanosis, clubbing, or edema. Skin: warm and dry with no masses, lesions, or rashes Neuro: Cranial nerves 2-12 grossly intact, sensation is normal throughout Psych: A&Ox3 with an appropriate affect, but slowed speech.   Results for orders placed or performed during the hospital encounter of 07/25/20 (from the past 48 hour(s))  CBC with Differential     Status: Abnormal   Collection Time: 07/25/20  9:26 PM  Result Value Ref Range   WBC 10.6 (H) 4.0 - 10.5 K/uL   RBC 4.07 (L) 4.22 - 5.81 MIL/uL   Hemoglobin 13.0 13.0 - 17.0 g/dL   HCT 16.1 (L) 09.6 - 04.5 %   MCV 93.9 80.0 - 100.0 fL   MCH 31.9 26.0 - 34.0 pg   MCHC 34.0 30.0 - 36.0 g/dL   RDW 40.9 81.1 - 91.4 %   Platelets 277 150 - 400 K/uL   nRBC 0.0 0.0 - 0.2 %   Neutrophils Relative % 76 %   Neutro Abs 8.0 (H) 1.7 - 7.7 K/uL   Lymphocytes Relative 17 %   Lymphs Abs 1.8 0.7 - 4.0 K/uL   Monocytes Relative 5 %   Monocytes Absolute 0.5 0.1 - 1.0 K/uL   Eosinophils Relative 1 %   Eosinophils Absolute 0.1 0.0 - 0.5 K/uL   Basophils Relative 0 %   Basophils Absolute 0.0 0.0 - 0.1 K/uL   Immature Granulocytes 1 %   Abs Immature Granulocytes 0.15 (H) 0.00 - 0.07 K/uL    Comment: Performed at [facility], [address] [place], [place] [address]  Comprehensive metabolic panel     Status: Abnormal   Collection Time: 07/25/20  9:26 PM  Result Value Ref Range   Sodium 130 (L) 135 - 145 mmol/L   Potassium 3.7 3.5 - 5.1 mmol/L   Chloride 95 (L) 98 - 111 mmol/L   CO2 23 22 - 32 mmol/L   Glucose, Bld 164 (H) 70 - 99 mg/dL    Comment: Glucose reference range applies only to samples taken after fasting for at least  8 hours.   BUN 12 8 - 23 mg/dL   Creatinine, Ser 6.21 0.61 - 1.24 mg/dL   Calcium 9.3 8.9 - 30.8 mg/dL   Total Protein 7.3 6.5 - 8.1 g/dL   Albumin 4.1 3.5 - 5.0 g/dL  AST 23 15 - 41 U/L   ALT 19 0 - 44 U/L   Alkaline Phosphatase 68 38 - 126 U/L   Total Bilirubin 0.9 0.3 - 1.2 mg/dL   GFR, Estimated >94 >50 mL/min    Comment: (NOTE) Calculated using the CKD-EPI Creatinine Equation (2021)    Anion gap 12 5 - 15    Comment: Performed at [facility], [address] [place], [place] [address]  Lipase, blood     Status: None   Collection Time: 07/25/20  9:26 PM  Result Value Ref Range   Lipase 28 11 - 51 U/L    Comment: Performed at [facility], [address], [place], [place] [address]  Resp Panel by RT-PCR (Flu A&B, Covid) Nasopharyngeal Swab     Status: None   Collection Time: 07/26/20  2:07 AM   Specimen: Nasopharyngeal Swab; Nasopharyngeal(NP) swabs in vial transport medium  Result Value Ref Range   SARS Coronavirus 2 by RT PCR NEGATIVE NEGATIVE    Comment: (NOTE) SARS-CoV-2 target nucleic acids are NOT DETECTED.  The SARS-CoV-2 RNA is generally detectable in upper respiratory specimens during the acute phase of infection. The lowest concentration of SARS-CoV-2 viral copies this assay can detect is 138 copies/mL. A negative result does not preclude SARS-Cov-2 infection and should not be used as the sole basis for treatment or other patient management decisions. A negative result may occur with  improper specimen collection/handling, submission of specimen other than nasopharyngeal swab, presence of viral mutation(s) within the areas targeted by this assay, and inadequate number of viral copies(<138 copies/mL). A negative result must be combined with clinical observations, patient history, and epidemiological information. The expected result is Negative.  Fact Sheet for Patients:  BloggerCourse.com  Fact  Sheet for Healthcare Providers:  SeriousBroker.it  This test is no t yet approved or cleared by the [place] FDA and  has been authorized for detection and/or diagnosis of SARS-CoV-2 by FDA under an Emergency Use Authorization (EUA). This EUA will remain  in effect (meaning this test can be used) for the duration of the COVID-19 declaration under Section 564(b)(1) of the Act, 21 U.S.C.section 360bbb-3(b)(1), unless the authorization is terminated  or revoked sooner.       Influenza A by PCR NEGATIVE NEGATIVE   Influenza B by PCR NEGATIVE NEGATIVE    Comment: (NOTE) The Xpert Xpress SARS-CoV-2/FLU/RSV plus assay is intended as an aid in the diagnosis of influenza from Nasopharyngeal swab specimens and should not be used as a sole basis for treatment. Nasal washings and aspirates are unacceptable for Xpert Xpress SARS-CoV-2/FLU/RSV testing.  Fact Sheet for Patients: BloggerCourse.com  Fact Sheet for Healthcare Providers: SeriousBroker.it  This test is not yet approved or cleared by the [place] FDA and has been authorized for detection and/or diagnosis of SARS-CoV-2 by FDA under an Emergency Use Authorization (EUA). This EUA will remain in effect (meaning this test can be used) for the duration of the COVID-19 declaration under Section 564(b)(1) of the Act, 21 U.S.C. section 360bbb-3(b)(1), unless the authorization is terminated or revoked.  Performed at [facility], [address] [place], [place] [address]   Comprehensive metabolic panel     Status: Abnormal   Collection Time: 07/26/20  4:54 AM  Result Value Ref Range   Sodium 132 (L) 135 - 145 mmol/L   Potassium 3.4 (L) 3.5 - 5.1 mmol/L   Chloride 101 98 - 111 mmol/L   CO2 22 22 -  32 mmol/L   Glucose, Bld 141 (H) 70 - 99 mg/dL    Comment: Glucose reference range applies only to samples taken after fasting for at  least 8 hours.   BUN 13 8 - 23 mg/dL   Creatinine, Ser 9.70 0.61 - 1.24 mg/dL   Calcium 8.5 (L) 8.9 - 10.3 mg/dL   Total Protein 6.3 (L) 6.5 - 8.1 g/dL   Albumin 3.5 3.5 - 5.0 g/dL   AST 18 15 - 41 U/L   ALT 17 0 - 44 U/L   Alkaline Phosphatase 60 38 - 126 U/L   Total Bilirubin 0.8 0.3 - 1.2 mg/dL   GFR, Estimated >26 >37 mL/min    Comment: (NOTE) Calculated using the CKD-EPI Creatinine Equation (2021)    Anion gap 9 5 - 15    Comment: Performed at [facility], [address], [place], [place] [address]  CBC     Status: Abnormal   Collection Time: 07/26/20  4:54 AM  Result Value Ref Range   WBC 10.4 4.0 - 10.5 K/uL   RBC 3.77 (L) 4.22 - 5.81 MIL/uL   Hemoglobin 12.0 (L) 13.0 - 17.0 g/dL   HCT 02.7 (L) 74.1 - 28.7 %   MCV 95.0 80.0 - 100.0 fL   MCH 31.8 26.0 - 34.0 pg   MCHC 33.5 30.0 - 36.0 g/dL   RDW 86.7 67.2 - 09.4 %   Platelets 238 150 - 400 K/uL   nRBC 0.0 0.0 - 0.2 %    Comment: Performed at [facility], [address], [place], [place] [address]  Glucose, capillary     Status: Abnormal   Collection Time: 07/26/20  7:46 AM  Result Value Ref Range   Glucose-Capillary 112 (H) 70 - 99 mg/dL    Comment: Glucose reference range applies only to samples taken after fasting for at least 8 hours.  Urinalysis, Routine w reflex microscopic Urine, Clean Catch     Status: Abnormal   Collection Time: 07/26/20  8:05 AM  Result Value Ref Range   Color, Urine STRAW (A) YELLOW   APPearance CLEAR CLEAR   Specific Gravity, Urine 1.021 1.005 - 1.030   pH 7.0 5.0 - 8.0   Glucose, UA NEGATIVE NEGATIVE mg/dL   Hgb urine dipstick SMALL (A) NEGATIVE   Bilirubin Urine NEGATIVE NEGATIVE   Ketones, ur 5 (A) NEGATIVE mg/dL   Protein, ur NEGATIVE NEGATIVE mg/dL   Nitrite NEGATIVE NEGATIVE   Leukocytes,Ua NEGATIVE NEGATIVE   RBC / HPF 0-5 0 - 5 RBC/hpf   WBC, UA 0-5 0 - 5 WBC/hpf   Bacteria, UA NONE SEEN NONE SEEN   Squamous Epithelial / LPF 0-5 0 - 5     Comment: Performed at [facility], [address] [place], [place] [address]  Glucose, capillary     Status: None   Collection Time: 07/26/20 11:33 AM  Result Value Ref Range   Glucose-Capillary 99 70 - 99 mg/dL    Comment: Glucose reference range applies only to samples taken after fasting for at least 8 hours.   CT ABDOMEN PELVIS W CONTRAST  Result Date: 07/26/2020 CLINICAL DATA:  Abdominal pain for several hours EXAM: CT ABDOMEN AND PELVIS WITH CONTRAST TECHNIQUE: Multidetector CT imaging of the abdomen and pelvis was performed using the standard protocol following bolus administration of intravenous contrast. CONTRAST:  OMNIPAQUE IOHEXOL 300 MG/ML  SOLN COMPARISON:  None. FINDINGS: Lower chest: Lung bases are free of acute infiltrate  or sizable effusion. Large hiatal hernia is noted. Hepatobiliary: No focal liver abnormality is seen. No gallstones, gallbladder wall thickening, or biliary dilatation. Pancreas: Unremarkable. No pancreatic ductal dilatation or surrounding inflammatory changes. Spleen: Normal in size without focal abnormality. Adrenals/Urinary Tract: Adrenal glands are within normal limits. Kidneys demonstrate a normal enhancement pattern bilaterally normal excretion is noted on delayed images. No ureteral stones are seen. The bladder is well distended. Stomach/Bowel: Scattered diverticular change of the colon is noted without evidence of diverticulitis. A loop of sigmoid colon is noted within a left inguinal hernia without incarceration. The more proximal colon appears within normal limits. The appendix is within normal limits. Small bowel demonstrates multiple dilated loops of jejunum and ileum with a transition zone in the left lower quadrant although no definitive mass is seen. Some inflammatory changes in the distal ileum are seen which likely contribute to the more proximal obstructive process. The stomach is decompressed. Large hiatal hernia is again  noted. Vascular/Lymphatic: Aortic atherosclerosis. No enlarged abdominal or pelvic lymph nodes. Reproductive: Prostate is unremarkable. Other: Left inguinal hernia is noted with fluid and a loop of sigmoid colon within. No incarceration is noted. Musculoskeletal: Chronic T12 compression deformity is noted. No acute bony abnormality is seen. IMPRESSION: Multiple dilated loops of small bowel with a transition zone in the left lower quadrant likely secondary to inflammatory changes in the distal ileum. These changes are consistent with a partial small bowel obstruction. Diverticulosis without diverticulitis. Large hiatal hernia. Left inguinal hernia with a loop of sigmoid colon within although no obstructive changes are noted. Electronically Signed   By: Alcide CleverMark  Lukens M.D.   On: 07/26/2020 00:57      Assessment/Plan DM HTN Hiatal hernia Left inguinal hernia  PSBO The patient has never had abdominal surgery.  He does appear to have some dilated loops of small bowel on his CT scan that are concerning for as noted psbo; however, his stomach is not distended and he has no nausea or vomiting.  His abdominal pain has resolved since admission and he has had 5 BMs.  His hernia is soft and reducible.  This is not felt to be the etiology of his obstructive symptoms upon arrival.  Given, he has improving, we will let him have some clear liquids and follow him clinically.  If he worsens, then he may need repeat films +/- SBO protocol/NGT placement.  We will follow with you.   FEN - CLD/IVFs VTE - ok for lovenox from our standpoint ID - none currently needed  Letha CapeKelly E Mileigh Tilley, [facility] 07/26/2020, 12:21 PM Please see Amion for pager number during day hours 7:00am-4:30pm or 7:00am -11:30am on weekends

## 2020-07-26 NOTE — Progress Notes (Signed)
Chaplain engaged in an initial visit with Elmo. Nikia shared that he had not been able to sleep for the last 36 hours.  He expressed that he normally takes sleep medication and has done that for years.  He stated that his daughter Geanie Berlin was going to bring his medication from [facility] but that she had some prior engagements to fulfill first.  Chaplain asked Woodfin if he had spoken with his nurse or doctor about that and he stated, "No."  Chaplain told him that she would follow-up with nurse about his concerns.  Chaplain was able to speak with nurse, Dahlia Client, about Thermon's need for sleep.    Ric was grateful for chaplain's visit.  Chaplain offered ministries of presence and listening.  Chaplain is available to continue to follow-up.    07/26/20 1100  Clinical Encounter Type  Visited With Patient  Visit Type Initial

## 2020-07-26 NOTE — Evaluation (Signed)
Physical Therapy Evaluation Patient Details Name: Russell Ayers MRN: [id number] DOB: 1933-02-03 Today's Date: 07/26/2020   History of Present Illness  Patient is an 87 y.o. malewho  presented to [facility] for abdominal pain found to have SBO with PMH significant for DM, anxiety, HTN, inguinal hernia, and insomnia.  Clinical Impression  Pt is an 87y.o. male with above HPI. Pt reports that he is modified independent with use of cane for mobility at baseline. Pt required MIN -MOD assist for power up to stand with sit to stand transfers. Pt required MIN assist for safety and stability with stand pivot from EOB to recliner and experienced dizziness which resolved with seated rest, BP found to be 158/52, RN aware. Pt resides at an assisted living facility and has a daughter who visit him frequently. Pt will benefit from skilled PT to increase independence and safety with mobility. Acute therapy to follow up during stay to progress functional mobility as able.      Follow Up Recommendations Home health PT    Equipment Recommendations  Rolling walker with 5" wheels    Recommendations for Other Services       Precautions / Restrictions Precautions Precautions: Fall Restrictions Weight Bearing Restrictions: No      Mobility  Bed Mobility Overal bed mobility: Needs Assistance Bed Mobility: Supine to Sit     Supine to sit: Supervision;HOB elevated     General bed mobility comments: use of bed rails and B UEs to scoot to EOB with supervision for safety. Pt became dizzy while EOB which resolved after ~2-3 min of rest.    Transfers Overall transfer level: Needs assistance Equipment used: Rolling walker (2 wheeled) Transfers: Sit to/from [employer] Sit to Stand: Min assist;Mod assist;From elevated surface Stand pivot transfers: Min assist       General transfer comment: MIN-MOD assist for power up to stand with cues for safe hand placement. Pt reported dizziness in  standing which resolved with standing rest. MIN assist for stand pivot to recliner for safety and stability with cues for RW management. Pt became dizzy during stand pivot transfer and was assisted to recliner. BP found to be 158/52, symptoms resolved with seated rest.  Ambulation/Gait                Stairs            Wheelchair Mobility    Modified Rankin (Stroke Patients Only)       Balance Overall balance assessment: Needs assistance Sitting-balance support: Feet supported Sitting balance-Leahy Scale: Fair     Standing balance support: During functional activity;Bilateral upper extremity supported Standing balance-Leahy Scale: Poor Standing balance comment: use of RW to maintain standing balance                             Pertinent Vitals/Pain Pain Assessment: Faces Faces Pain Scale: Hurts even more Pain Location: abdomen Pain Descriptors / Indicators: Discomfort;Sore Pain Intervention(s): Limited activity within patient's tolerance;Monitored during session;Repositioned    Home Living Family/patient expects to be discharged to:: Assisted living Living Arrangements: Alone             Home Equipment: Gilmer Mor - single point Additional Comments: pt has a daughter who visits him frequently.    Prior Function Level of Independence: Independent with assistive device(s)         Comments: use of cane and typically does not walk long distances, pt reports he has wc  but rarely uses it.     Hand Dominance   Dominant Hand: Right    Extremity/Trunk Assessment   Upper Extremity Assessment Upper Extremity Assessment: Overall WFL for tasks assessed    Lower Extremity Assessment Lower Extremity Assessment: Generalized weakness    Cervical / Trunk Assessment Cervical / Trunk Assessment: Kyphotic  Communication   Communication: No difficulties  Cognition Arousal/Alertness: Awake/alert Behavior During Therapy: WFL for tasks  assessed/performed Overall Cognitive Status: Within Functional Limits for tasks assessed                                        General Comments      Exercises     Assessment/Plan    PT Assessment Patient needs continued PT services  PT Problem List Decreased strength;Decreased activity tolerance;Decreased balance;Decreased mobility;Decreased knowledge of use of DME;Pain       PT Treatment Interventions DME instruction;Gait training;Functional mobility training;Therapeutic activities;Therapeutic exercise;Balance training;Stair training;Patient/family education    PT Goals (Current goals can be found in the Care Plan section)  Acute Rehab PT Goals Patient Stated Goal: none stated PT Goal Formulation: With patient Time For Goal Achievement: 08/02/20 Potential to Achieve Goals: Good    Frequency Min 3X/week   Barriers to discharge        Co-evaluation               AM-PAC PT "6 Clicks" Mobility  Outcome Measure Help needed turning from your back to your side while in a flat bed without using bedrails?: None Help needed moving from lying on your back to sitting on the side of a flat bed without using bedrails?: A Little Help needed moving to and from a bed to a chair (including a wheelchair)?: A Lot Help needed standing up from a chair using your arms (e.g., wheelchair or bedside chair)?: A Lot Help needed to walk in hospital room?: A Little Help needed climbing 3-5 steps with a railing? : A Lot 6 Click Score: 16    End of Session Equipment Utilized During Treatment: Gait belt Activity Tolerance: Patient tolerated treatment well Patient left: in chair;with call bell/phone within reach;with chair alarm set Nurse Communication: Mobility status (pt's symptoms of dizziness) PT Visit Diagnosis: Unsteadiness on feet (R26.81);Muscle weakness (generalized) (M62.81);Pain Pain - Right/Left: Left Pain - part of body:  (abdomen)    Time: 5366-4403 PT Time  Calculation (min) (ACUTE ONLY): 23 min   Charges:   PT Evaluation $PT Eval Low Complexity: 1 Low PT Treatments $Therapeutic Activity: 8-22 mins       Marcha Licklider, SPT  Acute rehab    Azzure Garabedian 07/26/2020, 3:09 PM

## 2020-07-27 ENCOUNTER — Inpatient Hospital Stay (HOSPITAL_COMMUNITY): Payer: Medicare Other

## 2020-07-27 LAB — CBC
HCT: 35 % — ABNORMAL LOW (ref 39.0–52.0)
Hemoglobin: 11.6 g/dL — ABNORMAL LOW (ref 13.0–17.0)
MCH: 31.9 pg (ref 26.0–34.0)
MCHC: 33.1 g/dL (ref 30.0–36.0)
MCV: 96.2 fL (ref 80.0–100.0)
Platelets: 230 10*3/uL (ref 150–400)
RBC: 3.64 MIL/uL — ABNORMAL LOW (ref 4.22–5.81)
RDW: 12.8 % (ref 11.5–15.5)
WBC: 7.1 10*3/uL (ref 4.0–10.5)
nRBC: 0 % (ref 0.0–0.2)

## 2020-07-27 LAB — GLUCOSE, CAPILLARY
Glucose-Capillary: 122 mg/dL — ABNORMAL HIGH (ref 70–99)
Glucose-Capillary: 127 mg/dL — ABNORMAL HIGH (ref 70–99)
Glucose-Capillary: 130 mg/dL — ABNORMAL HIGH (ref 70–99)
Glucose-Capillary: 134 mg/dL — ABNORMAL HIGH (ref 70–99)
Glucose-Capillary: 170 mg/dL — ABNORMAL HIGH (ref 70–99)
Glucose-Capillary: 99 mg/dL (ref 70–99)

## 2020-07-27 LAB — BASIC METABOLIC PANEL
Anion gap: 9 (ref 5–15)
BUN: 7 mg/dL — ABNORMAL LOW (ref 8–23)
CO2: 21 mmol/L — ABNORMAL LOW (ref 22–32)
Calcium: 8.8 mg/dL — ABNORMAL LOW (ref 8.9–10.3)
Chloride: 104 mmol/L (ref 98–111)
Creatinine, Ser: 0.65 mg/dL (ref 0.61–1.24)
GFR, Estimated: >60 mL/min (ref >60)
Glucose, Bld: 136 mg/dL — ABNORMAL HIGH (ref 70–99)
Potassium: 3.7 mmol/L (ref 3.5–5.1)
Sodium: 134 mmol/L — ABNORMAL LOW (ref 135–145)

## 2020-07-27 LAB — MAGNESIUM: Magnesium: 1.9 mg/dL (ref 1.7–2.4)

## 2020-07-27 MED ORDER — LORAZEPAM 0.5 MG PO TABS
0.5000 mg | ORAL_TABLET | Freq: Two times a day (BID) | ORAL | Status: DC | PRN
  Administered 2020-07-27 – 2020-07-28 (×2): 0.5 mg via ORAL
  Filled 2020-07-27 (×2): qty 1

## 2020-07-27 MED ORDER — BENAZEPRIL HCL 20 MG PO TABS
10.0000 mg | ORAL_TABLET | Freq: Every day | ORAL | Status: DC
  Administered 2020-07-27 – 2020-08-03 (×7): 10 mg via ORAL
  Filled 2020-07-27: qty 2
  Filled 2020-07-27: qty 1
  Filled 2020-07-27: qty 2
  Filled 2020-07-27 (×4): qty 1

## 2020-07-27 MED ORDER — TERAZOSIN HCL 1 MG PO CAPS
1.0000 mg | ORAL_CAPSULE | Freq: Every day | ORAL | Status: DC
  Administered 2020-07-27 – 2020-08-02 (×6): 1 mg via ORAL
  Filled 2020-07-27 (×8): qty 1

## 2020-07-27 MED ORDER — POTASSIUM CHLORIDE 10 MEQ/100ML IV SOLN
10.0000 meq | INTRAVENOUS | Status: AC
  Administered 2020-07-27 (×2): 10 meq via INTRAVENOUS
  Filled 2020-07-27 (×2): qty 100

## 2020-07-27 MED ORDER — TEMAZEPAM 30 MG PO CAPS
30.0000 mg | ORAL_CAPSULE | Freq: Every day | ORAL | Status: DC
  Administered 2020-07-27: 30 mg via ORAL
  Filled 2020-07-27: qty 2

## 2020-07-27 MED ORDER — FINASTERIDE 5 MG PO TABS
5.0000 mg | ORAL_TABLET | Freq: Every day | ORAL | Status: DC
  Administered 2020-07-27 – 2020-08-03 (×7): 5 mg via ORAL
  Filled 2020-07-27 (×7): qty 1

## 2020-07-27 NOTE — TOC Initial Note (Signed)
Transition of Care [facility]) - Initial/Assessment Note    Patient Details  Name: Russell Ayers MRN: [id number] Date of Birth: 1932/12/20  Transition of Care [facility]) CM/SW Contact:    Clearance Coots, LCSW Phone Number: 07/27/2020, 10:28 AM  Clinical Narrative:    Patient admitted for a SBO.               CSW reached out to the patient daughter Geanie Berlin to follow up with PT recommendation. Daughter reports the patient lives in the Independent living at [facility]. Daughter has noticed the patient has declined in his mobility and cognitive abilities. She reports the patient is more forgetful than usual.. Daughter report she reached out to the Independent living care manager and notified them of her concerns. She reports the care manager reached out to the [facility] on[facility].  CSW reached out to the admission coordinator Meryle Ready. She confirm they will have a bed for the patient discharge.   TOC staff will continue to follow this patient.   Expected Discharge Plan: Skilled Nursing Facility Barriers to Discharge: Continued Medical Work up   Patient Goals and CMS Choice Patient states their goals for this hospitalization and ongoing recovery are:: Per daughter, "He is too weak to go back to his independent  living, he needs to go to rehab."      Expected Discharge Plan and Services Expected Discharge Plan: Skilled Nursing Facility In-house Referral: Clinical Social Work Discharge Planning Services: CM Consult Post Acute Care Choice: Durable Medical Equipment Living arrangements for the past 2 months: Independent Living Facility                                      Prior Living Arrangements/Services Living arrangements for the past 2 months: Independent Living Facility Lives with:: Facility Resident Patient language and need for interpreter reviewed:: No        Need for Family Participation in Patient Care: Yes (Comment) Care giver support system in  place?: Yes (comment) Current home services: DME Criminal Activity/Legal Involvement Pertinent to Current Situation/Hospitalization: No - Comment as needed  Activities of Daily Living Home Assistive Devices/Equipment: Research scientist (physical sciences) (specify quad or straight),Dentures (specify type) ADL Screening (condition at time of admission) Patient's cognitive ability adequate to safely complete daily activities?: Yes Is the patient deaf or have difficulty hearing?: No Does the patient have difficulty seeing, even when wearing glasses/contacts?: No Does the patient have difficulty concentrating, remembering, or making decisions?: No Patient able to express need for assistance with ADLs?: Yes Does the patient have difficulty dressing or bathing?: Yes Independently performs ADLs?: No Communication: Independent Dressing (OT): Needs assistance Is this a change from baseline?: Pre-admission baseline Grooming: Needs assistance Is this a change from baseline?: Pre-admission baseline Feeding: Independent Bathing: Needs assistance Is this a change from baseline?: Pre-admission baseline Toileting: Needs assistance Is this a change from baseline?: Pre-admission baseline In/Out Bed: Needs assistance Is this a change from baseline?: Pre-admission baseline Walks in Home: Needs assistance Is this a change from baseline?: Pre-admission baseline Does the patient have difficulty walking or climbing stairs?: No Weakness of Legs: None Weakness of Arms/Hands: None  Permission Sought/Granted   Permission granted to share information with : Yes, Release of Information Signed  Share Information with NAME: Kohn,Staci Daughter  Permission granted to share info w AGENCY: SNF [facility])  Permission granted to share info w Relationship: Daughter  Permission granted to share info w Contact Information: [phone]  Emotional Assessment Appearance:: Appears stated age     Orientation:  : Oriented to Self Alcohol / Substance Use: Not Applicable Psych Involvement: No (comment)  Admission diagnosis:  SBO (small bowel obstruction) (HCC) [K56.609] Left lower quadrant abdominal pain [R10.32] Patient Active Problem List   Diagnosis Date Noted  . SBO (small bowel obstruction) (HCC) 07/26/2020  . Essential hypertension 07/26/2020  . Diabetic neuropathy (HCC) 04/30/2020  . Pain due to onychomycosis of toenails of both feet 11/29/2018   PCP:  Soundra Pilon, FNP Pharmacy:   [facility] [phone] - [place], [place] - [address] [place] [address] Phone: [phone] Fax: [phone]     Social Determinants of Health (SDOH) Interventions    Readmission Risk Interventions No flowsheet data found.

## 2020-07-27 NOTE — Evaluation (Signed)
Occupational Therapy Evaluation Patient Details Name: Russell Ayers MRN: [id number] DOB: 1932/12/27 Today's Date: 07/27/2020    History of Present Illness Patient is an 87 y.o. malewho  presented to [facility] for abdominal pain found to have SBO with PMH significant for DM, anxiety, HTN, inguinal hernia, and insomnia.   Clinical Impression   Pt from ILF (per patient, assisted living per chart review) today Pt present with confusion, following one step directions with increased time, requiring verbal and physical cues for task completion. Mod A for LB ADL at this time, min to set up for UB ADL. Unable to perform ADL in standing. Symptomatic and positive for orthostatic pressure drop during OT session. Mod A for sit<>stand from elevated bed with RW. Due to physical assist needed and cognition Pt will benefit from skilled OT in the acute setting and afterwards at the SNF level. Next session to focus on OOB activity (maybe sink level activity should orthostatics allow).    Follow Up Recommendations  SNF    Equipment Recommendations  3 in 1 bedside commode;Other (comment) (defer to next venue of care)    Recommendations for Other Services       Precautions / Restrictions Precautions Precautions: Fall Precaution Comments: watch BP Restrictions Weight Bearing Restrictions: No      Mobility Bed Mobility Overal bed mobility: Needs Assistance Bed Mobility: Supine to Sit     Supine to sit: Min assist     General bed mobility comments: min A for trunk elevation. Pt dizzy with positional changes from supine to sitting, resolved with sitting 2-3 min    Transfers Overall transfer level: Needs assistance Equipment used: Rolling walker (2 wheeled) Transfers: Sit to/from [employer] Sit to Stand: Mod assist;From elevated surface Stand pivot transfers: Min assist       General transfer comment: mod A for power up, cues for safe hand placement    Balance Overall balance  assessment: Needs assistance Sitting-balance support: Feet supported Sitting balance-Leahy Scale: Fair     Standing balance support: During functional activity;Bilateral upper extremity supported Standing balance-Leahy Scale: Poor Standing balance comment: dependent on BUE support in standing                           ADL either performed or assessed with clinical judgement   ADL Overall ADL's : Needs assistance/impaired Eating/Feeding: Set up;Sitting   Grooming: Wash/dry hands;Wash/dry face;Minimal assistance;Sitting Grooming Details (indicate cue type and reason): unable to stand at sink at this time for grooming activity - completed sitting in recliner Upper Body Bathing: Moderate assistance   Lower Body Bathing: Minimal assistance;Sitting/lateral leans   Upper Body Dressing : Minimal assistance;Sitting   Lower Body Dressing: Moderate assistance;Sit to/from stand Lower Body Dressing Details (indicate cue type and reason): able to don socks from seated position - should be educated on figure 4 method Toilet Transfer: Moderate assistance;Stand-pivot;RW;Cueing for safety;Cueing for sequencing Toilet Transfer Details (indicate cue type and reason): mod A for boost, vc for safe hand placement Toileting- Clothing Manipulation and Hygiene: Sitting/lateral lean;Min guard       Functional mobility during ADLs: Moderate assistance;Cueing for safety;Cueing for sequencing;Rolling walker General ADL Comments: confusion, vc for safety, repeated instructions and increased assist     Vision Baseline Vision/History: Wears glasses Wears Glasses: At all times Patient Visual Report: No change from baseline       Perception     Praxis      Pertinent Vitals/Pain Pain  Assessment: Faces Faces Pain Scale: Hurts little more Pain Location: abdomen Pain Descriptors / Indicators: Discomfort;Sore Pain Intervention(s): Limited activity within patient's tolerance;Monitored during  session;Repositioned     Hand Dominance Right   Extremity/Trunk Assessment Upper Extremity Assessment Upper Extremity Assessment: Overall WFL for tasks assessed   Lower Extremity Assessment Lower Extremity Assessment: Generalized weakness   Cervical / Trunk Assessment Cervical / Trunk Assessment: Kyphotic   Communication Communication Communication: HOH   Cognition Arousal/Alertness: Awake/alert Behavior During Therapy: WFL for tasks assessed/performed Overall Cognitive Status: Impaired/Different from baseline Area of Impairment: Attention;Memory;Following commands;Safety/judgement;Awareness;Problem solving                   Current Attention Level: Sustained Memory: Decreased short-term memory Following Commands: Follows one step commands consistently;Follows one step commands with increased time Safety/Judgement: Decreased awareness of safety;Decreased awareness of deficits Awareness: Emergent Problem Solving: Decreased initiation;Difficulty sequencing;Requires verbal cues;Requires tactile cues General Comments: Pt unable to recall safety with RW, required cues to position better for sit<>stand (baseline neuropathy?) required assist with denture care   General Comments  Initial BP sitting EOB was 143/72, then was able to stand  (symptomatic for orthostatics) for BP which was 121/67. Pt left in chair for more upright positioning to improve BP    Exercises     Shoulder Instructions      Home Living Family/patient expects to be discharged to:: Other (Comment) (Independent Living) Living Arrangements: Alone                           Home Equipment: Cane - single point   Additional Comments: pt has a daughter who visits him frequently.      Prior Functioning/Environment Level of Independence: Independent with assistive device(s)        Comments: use of cane and typically does not walk long distances, pt reports he has wc but rarely uses it. he does  his own ADL but medicine management and meals provided        OT Problem List: Decreased activity tolerance;Impaired balance (sitting and/or standing);Decreased cognition;Decreased safety awareness;Decreased knowledge of use of DME or AE;Decreased knowledge of precautions      OT Treatment/Interventions: Self-care/ADL training;Therapeutic exercise;DME and/or AE instruction;Therapeutic activities;Cognitive remediation/compensation;Patient/family education;Balance training    OT Goals(Current goals can be found in the care plan section) Acute Rehab OT Goals Patient Stated Goal: to find the news channel OT Goal Formulation: With patient Time For Goal Achievement: 08/10/20 Potential to Achieve Goals: Good ADL Goals Pt Will Perform Grooming: with supervision;standing Pt Will Perform Upper Body Dressing: with modified independence;sitting Pt Will Perform Lower Body Dressing: with supervision;sit to/from stand Pt Will Transfer to Toilet: with supervision;ambulating Pt Will Perform Toileting - Clothing Manipulation and hygiene: with supervision;sit to/from stand  OT Frequency: Min 2X/week   Barriers to D/C:    Daughter would also like her Dad to go SNF initially       Co-evaluation              AM-PAC OT "6 Clicks" Daily Activity     Outcome Measure Help from another person eating meals?: None Help from another person taking care of personal grooming?: A Little Help from another person toileting, which includes using toliet, bedpan, or urinal?: A Lot Help from another person bathing (including washing, rinsing, drying)?: A Little Help from another person to put on and taking off regular upper body clothing?: A Little Help from another person to put on and  taking off regular lower body clothing?: A Lot 6 Click Score: 17   End of Session Equipment Utilized During Treatment: Engineer, water Communication: Mobility status;Precautions  Activity Tolerance: Treatment limited  secondary to medical complications (Comment) (orthostatic (and symptomatic)) Patient left: in chair;with call bell/phone within reach;with chair alarm set  OT Visit Diagnosis: Unsteadiness on feet (R26.81);Other abnormalities of gait and mobility (R26.89);Muscle weakness (generalized) (M62.81);Other symptoms and signs involving cognitive function;Dizziness and giddiness (R42)                Time: 7017-7939 OT Time Calculation (min): 31 min Charges:  OT General Charges $OT Visit: 1 Visit OT Evaluation $OT Eval Moderate Complexity: 1 Mod OT Treatments $Self Care/Home Management : 8-22 mins  Russell Ayers OTR/L Acute Rehabilitation Services Pager: [phone] Office: [phone]  Russell Ayers 07/27/2020, 1:01 PM

## 2020-07-27 NOTE — Progress Notes (Signed)
Physical Therapy Treatment Patient Details Name: Russell Ayers MRN: [id number] DOB: 1932/10/26 Today's Date: 07/27/2020    History of Present Illness Patient is an 87 y.o. malewho  presented to [facility] for abdominal pain found to have SBO with PMH significant for DM, anxiety, HTN, inguinal hernia, and insomnia.    PT Comments    Pt is progressing toward acute therapy goals. Pt required MIN assist and cues for safe hand placement and sequencing with supine to sit transfer. Pt required MOD assist for power up and stability with sit to stand from EOB. PT provided MIN assist for stability with ambulation 76ft as pt demonstrated staggering/drifting during gait and required tactile cues for obstacle negotiation. Recommend SNF placement to ensure pt safety as pt is requiring increased assist for all mobility at this time. Pt will benefit from skilled PT to increase independence and safety with mobility. Acute therapy to follow up during stay to progress functional mobility as able to ensure safe dischare home.      Follow Up Recommendations  SNF     Equipment Recommendations  Rolling walker with 5" wheels    Recommendations for Other Services       Precautions / Restrictions Precautions Precautions: Fall Precaution Comments: watch BP Restrictions Weight Bearing Restrictions: No    Mobility  Bed Mobility Overal bed mobility: Needs Assistance Bed Mobility: Supine to Sit     Supine to sit: Min assist     General bed mobility comments: MIN assist for initiation of LEs to EOB and trunk to upight with cues for sequenicng and use of UEs on bed rails to assist.    Transfers Overall transfer level: Needs assistance Equipment used: Rolling walker (2 wheeled) Transfers: Sit to/from [employer] Sit to Stand: Mod assist;From elevated surface         General transfer comment: mod assist for stability and  power up to stand with cues for safe hand  placement  Ambulation/Gait Ambulation/Gait assistance: Min assist Gait Distance (Feet): 95 Feet Assistive device: Rolling walker (2 wheeled) Gait Pattern/deviations: Step-through pattern;Drifts right/left;Staggering right;Staggering left Gait velocity: fair   General Gait Details: MIN assist for safety/stability as pt was unsteady with staggering/drifting during gait. Therapist provided cues for decreased velocity and to maintain safe proximity to RW during gait to improve stabilty. Cues needed to negotiate obstacles and pt bumped walker into doorframe and cart in hallway. Pt became dizzy and was assisted to recliner, dizziness improved in ~15-20s with seated rest.   Stairs             Wheelchair Mobility    Modified Rankin (Stroke Patients Only)       Balance Overall balance assessment: Needs assistance Sitting-balance support: Feet supported Sitting balance-Leahy Scale: Fair     Standing balance support: During functional activity;Bilateral upper extremity supported Standing balance-Leahy Scale: Poor Standing balance comment: use of RW, pt was able to stand with use of B UEs on RW with supervision and no LOB ~1-67min throughout session.                            Cognition Arousal/Alertness: Awake/alert Behavior During Therapy: WFL for tasks assessed/performed Overall Cognitive Status: Impaired/Different from baseline Area of Impairment: Attention;Memory;Following commands;Safety/judgement;Awareness;Problem solving                   Current Attention Level: Sustained Memory: Decreased short-term memory Following Commands: Follows one step commands consistently;Follows one step commands  with increased time Safety/Judgement: Decreased awareness of safety;Decreased awareness of deficits Awareness: Emergent Problem Solving: Decreased initiation;Difficulty sequencing;Requires verbal cues;Requires tactile cues General Comments: Pt requiring repeated  cues and increased time for processing commands throughout session.      Exercises      General Comments        Pertinent Vitals/Pain Pain Assessment: Faces Faces Pain Scale: Hurts a little bit Pain Location: abdomen Pain Descriptors / Indicators: Discomfort;Sore Pain Intervention(s): Monitored during session;Limited activity within patient's tolerance;Repositioned    Home Living                      Prior Function            PT Goals (current goals can now be found in the care plan section) Acute Rehab PT Goals Patient Stated Goal: none stated PT Goal Formulation: With patient/family Time For Goal Achievement: 08/02/20 Potential to Achieve Goals: Good Progress towards PT goals: Progressing toward goals    Frequency    Min 3X/week      PT Plan Discharge plan needs to be updated    Co-evaluation              AM-PAC PT "6 Clicks" Mobility   Outcome Measure  Help needed turning from your back to your side while in a flat bed without using bedrails?: None Help needed moving from lying on your back to sitting on the side of a flat bed without using bedrails?: A Little Help needed moving to and from a bed to a chair (including a wheelchair)?: A Lot Help needed standing up from a chair using your arms (e.g., wheelchair or bedside chair)?: A Lot Help needed to walk in hospital room?: A Little Help needed climbing 3-5 steps with a railing? : A Lot 6 Click Score: 16    End of Session Equipment Utilized During Treatment: Gait belt Activity Tolerance: Patient tolerated treatment well;Treatment limited secondary to medical complications (Comment) (dizziness during gait, resolved with rest) Patient left: in chair;with call bell/phone within reach;with chair alarm set;with family/visitor present Nurse Communication: Mobility status PT Visit Diagnosis: Unsteadiness on feet (R26.81);Muscle weakness (generalized) (M62.81);Pain Pain - Right/Left: Left Pain -  part of body:  (abdomen)     Time: 1657-9038 PT Time Calculation (min) (ACUTE ONLY): 23 min  Charges:  $Gait Training: 8-22 mins $Therapeutic Activity: 8-22 mins                     Russell Ayers, SPT  Acute rehab     Russell Ayers 07/27/2020, 7:00 PM

## 2020-07-27 NOTE — NC FL2 (Addendum)
  [place] MEDICAID FL2 LEVEL OF CARE SCREENING TOOL     IDENTIFICATION  Patient Name: Russell Ayers Birthdate: 1932/06/24 Sex: male Admission Date (Current Location): 07/25/2020  [facility] and [place] Number:  Producer, television/film/video and Address:  [facility],  [address] [place], [place] [address]      Provider Number: [id number]  Attending Physician Name and Address:  Osvaldo Shipper, MD  Relative Name and Phone Number:  Kohn,Staci Daughter   [phone]    Current Level of Care: Hospital Recommended Level of Care: Skilled Nursing Facility Prior Approval Number:    Date Approved/Denied:   PASRR Number:    [id number]  Discharge Plan: SNF    Current Diagnoses: Patient Active Problem List   Diagnosis Date Noted  . SBO (small bowel obstruction) (HCC) 07/26/2020  . Essential hypertension 07/26/2020  . Diabetic neuropathy (HCC) 04/30/2020  . Pain due to onychomycosis of toenails of both feet 11/29/2018    Orientation RESPIRATION BLADDER Height & Weight     Self,Place  Normal Incontinent Weight: 166 lb (75.3 kg) Height:  5\' 11"  (180.3 cm)  BEHAVIORAL SYMPTOMS/MOOD NEUROLOGICAL BOWEL NUTRITION STATUS      Continent Diet (See discharge summary)  AMBULATORY STATUS COMMUNICATION OF NEEDS Skin   Extensive Assist Verbally Normal                       Personal Care Assistance Level of Assistance  Bathing,Dressing,Feeding Bathing Assistance: Maximum assistance Feeding assistance: Independent Dressing Assistance: Maximum assistance     Functional Limitations Info  Sight,Hearing,Speech Sight Info: Impaired Hearing Info: Adequate Speech Info: Adequate    SPECIAL CARE FACTORS FREQUENCY  PT (By licensed PT),OT (By licensed OT)     PT Frequency: 5x/week OT Frequency: 5x/week            Contractures Contractures Info: Not present    Additional Factors Info  Code Status,Allergies,Psychotropic Code Status Info: Fullcode Allergies Info: Allergies:  No Known Allergies           Current Medications (07/27/2020):  This is the current hospital active medication list Current Facility-Administered Medications  Medication Dose Route Frequency Provider Last Rate Last Admin  . acetaminophen (TYLENOL) tablet 650 mg  650 mg Oral Q6H PRN 09/26/2020, MD       Or  . acetaminophen (TYLENOL) suppository 650 mg  650 mg Rectal Q6H PRN Eduard Clos, MD      . hydrALAZINE (APRESOLINE) injection 10 mg  10 mg Intravenous Q4H PRN Eduard Clos, MD      . latanoprost (XALATAN) 0.005 % ophthalmic solution 1 drop  1 drop Both Eyes QHS Eduard Clos, MD   1 drop at 07/26/20 2056  . morphine 2 MG/ML injection 1 mg  1 mg Intravenous Q4H PRN 2057, MD      . timolol (TIMOPTIC) 0.5 % ophthalmic solution 1 drop  1 drop Both Eyes BID Eduard Clos, MD   1 drop at 07/27/20 1049  . traZODone (DESYREL) tablet 50 mg  50 mg Oral QHS PRN 09/26/20, MD   50 mg at 07/26/20 2102     Discharge Medications: Please see discharge summary for a list of discharge medications.  Relevant Imaging Results:  Relevant Lab Results:   Additional Information ssn: [id number]  [id number], LCSW

## 2020-07-27 NOTE — Progress Notes (Addendum)
[facility] PROGRESS NOTE   Riely R Angola POE:[id number] DOB: [birth year] DOA: 07/25/2020  PCP: Soundra Pilon, FNP  Brief History/Interval Summary: 87 y.o. male with history of hypertension and prediabetes presents to the ER with complaints of abdominal pain. CT scan revealed small bowel obstruction. Patient was hospitalized for further management.  Consultants: General surgery  Procedures: None yet  Antibiotics: Anti-infectives (From admission, onward)   None      Subjective/Interval History: Patient is a very poor historian.  Patient does not appear to be in any discomfort.  However he was unable to tell me if he had any episodes of vomiting or if he has had any bowel movements today.  Nursing staff does report bowel movements yesterday but none overnight.     Assessment/Plan:  Small bowel obstruction CT scan raised concern for transition point in the terminal ileum. Concern raised for inflammatory changes in the distal ileum. However patient is not really tender in the right lower quadrant. Patient reports never having had any colonoscopy previously.  Abdomen seems to be more softer today compared to yesterday.  He remains on clear liquid diet.  General surgery is following.    Left-sided inguinal hernia Apparently has seen general surgery in the outpatient setting for same. No evidence for bowel incarceration at this time.  Altered Mental Status No obvious focal deficits. Patient is a bit confused per daughter. Likely due to being in the hospital. Will do CT head to rule out intracranial etiology.  Essential hypertension Occasional high readings noted.  Patient currently on as needed hydralazine.  At home patient is noted to be on benazepril and hydrochlorothiazide.  Should be able to resume this medication gradually.    Hyponatremia and hypokalemia Likely due to hypovolemia.  Sodium has improved.  Potassium is normal today.  Magnesium 1.9.    Normocytic  anemia No evidence of overt bleeding. Continue to monitor.  History of glaucoma Continue with eyedrops.  DVT Prophylaxis: SCDs Code Status: Full code Family Communication: No family at bedside Disposition Plan: May need rehab.  PT and OT has been consulted.  Status is: Inpatient  Remains inpatient appropriate because:Ongoing diagnostic testing needed not appropriate for outpatient work up, IV treatments appropriate due to intensity of illness or inability to take PO and Inpatient level of care appropriate due to severity of illness   Dispo: The patient is from: Home              Anticipated d/c is to: Home              Patient currently is not medically stable to d/c.   Difficult to place patient No      Medications:  Scheduled: . latanoprost  1 drop Both Eyes QHS  . timolol  1 drop Both Eyes BID   Continuous:  RXV:[id number] **OR** acetaminophen, hydrALAZINE, morphine injection, traZODone   Objective:  Vital Signs  Vitals:   07/26/20 1727 07/26/20 2153 07/27/20 0159 07/27/20 0532  BP: 135/72 136/63 139/71 (!) 169/77  Pulse: 75 81 85 89  Resp: 16 18 16 16   Temp: (!) 97.5 F (36.4 C) (!) 97.5 F (36.4 C) (!) 97.4 F (36.3 C) 97.7 F (36.5 C)  TempSrc: Oral Oral Oral Oral  SpO2: 98% 94% 98% 96%  Weight:      Height:        Intake/Output Summary (Last 24 hours) at 07/27/2020 1221 Last data filed at 07/27/2020 0940 Gross per 24 hour  Intake 2116.46  ml  Output 2100 ml  Net 16.46 ml   Filed Weights   07/25/20 2113  Weight: 75.3 kg    General appearance: Awake alert.  In no distress.  Noted to be distracted Resp: Clear to auscultation bilaterally.  Normal effort Cardio: S1-S2 is normal regular.  No S3-S4.  No rubs murmurs or bruit GI: Abdomen is soft.  Abdomen noted to be less distended today.  More soft.  Bowel sounds sluggish.  Nontender.   No masses organomegaly Extremities: No edema.  Moving all of his extremities Neurologic: Noted to be distracted.   Repeating things.  No obvious focal neurological deficits.   Lab Results:  Data Reviewed: I have personally reviewed following labs and imaging studies  CBC: Recent Labs  Lab 07/25/20 2126 07/26/20 0454 07/27/20 0510  WBC 10.6* 10.4 7.1  NEUTROABS 8.0*  --   --   HGB 13.0 12.0* 11.6*  HCT 38.2* 35.8* 35.0*  MCV 93.9 95.0 96.2  PLT 277 238 230    Basic Metabolic Panel: Recent Labs  Lab 07/25/20 2126 07/26/20 0454 07/27/20 0510  NA 130* 132* 134*  K 3.7 3.4* 3.7  CL 95* 101 104  CO2 23 22 21*  GLUCOSE 164* 141* 136*  BUN 12 13 7*  CREATININE 0.88 0.76 0.65  CALCIUM 9.3 8.5* 8.8*  MG  --   --  1.9    GFR: Estimated Creatinine Clearance: 70.6 mL/min (by C-G formula based on SCr of 0.65 mg/dL).  Liver Function Tests: Recent Labs  Lab 07/25/20 2126 07/26/20 0454  AST 23 18  ALT 19 17  ALKPHOS 68 60  BILITOT 0.9 0.8  PROT 7.3 6.3*  ALBUMIN 4.1 3.5    Recent Labs  Lab 07/25/20 2126  LIPASE 28    CBG: Recent Labs  Lab 07/26/20 1942 07/26/20 2338 07/27/20 0346 07/27/20 0728 07/27/20 1141  GLUCAP 166* 113* 122* 127* 134*     Recent Results (from the past 240 hour(s))  Resp Panel by RT-PCR (Flu A&B, Covid) Nasopharyngeal Swab     Status: None   Collection Time: 07/26/20  2:07 AM   Specimen: Nasopharyngeal Swab; Nasopharyngeal(NP) swabs in vial transport medium  Result Value Ref Range Status   SARS Coronavirus 2 by RT PCR NEGATIVE NEGATIVE Final    Comment: (NOTE) SARS-CoV-2 target nucleic acids are NOT DETECTED.  The SARS-CoV-2 RNA is generally detectable in upper respiratory specimens during the acute phase of infection. The lowest concentration of SARS-CoV-2 viral copies this assay can detect is 138 copies/mL. A negative result does not preclude SARS-Cov-2 infection and should not be used as the sole basis for treatment or other patient management decisions. A negative result may occur with  improper specimen collection/handling,  submission of specimen other than nasopharyngeal swab, presence of viral mutation(s) within the areas targeted by this assay, and inadequate number of viral copies(<138 copies/mL). A negative result must be combined with clinical observations, patient history, and epidemiological information. The expected result is Negative.  Fact Sheet for Patients:  BloggerCourse.com  Fact Sheet for Healthcare Providers:  SeriousBroker.it  This test is no t yet approved or cleared by the [place] FDA and  has been authorized for detection and/or diagnosis of SARS-CoV-2 by FDA under an Emergency Use Authorization (EUA). This EUA will remain  in effect (meaning this test can be used) for the duration of the COVID-19 declaration under Section 564(b)(1) of the Act, 21 U.S.C.section 360bbb-3(b)(1), unless the authorization is terminated  or revoked sooner.  Influenza A by PCR NEGATIVE NEGATIVE Final   Influenza B by PCR NEGATIVE NEGATIVE Final    Comment: (NOTE) The Xpert Xpress SARS-CoV-2/FLU/RSV plus assay is intended as an aid in the diagnosis of influenza from Nasopharyngeal swab specimens and should not be used as a sole basis for treatment. Nasal washings and aspirates are unacceptable for Xpert Xpress SARS-CoV-2/FLU/RSV testing.  Fact Sheet for Patients: BloggerCourse.com  Fact Sheet for Healthcare Providers: SeriousBroker.it  This test is not yet approved or cleared by the [place] FDA and has been authorized for detection and/or diagnosis of SARS-CoV-2 by FDA under an Emergency Use Authorization (EUA). This EUA will remain in effect (meaning this test can be used) for the duration of the COVID-19 declaration under Section 564(b)(1) of the Act, 21 U.S.C. section 360bbb-3(b)(1), unless the authorization is terminated or revoked.  Performed at [facility], [address], [place], [place] [address]       Radiology Studies: CT ABDOMEN PELVIS W CONTRAST  Result Date: 07/26/2020 CLINICAL DATA:  Abdominal pain for several hours EXAM: CT ABDOMEN AND PELVIS WITH CONTRAST TECHNIQUE: Multidetector CT imaging of the abdomen and pelvis was performed using the standard protocol following bolus administration of intravenous contrast. CONTRAST:  OMNIPAQUE IOHEXOL 300 MG/ML  SOLN COMPARISON:  None. FINDINGS: Lower chest: Lung bases are free of acute infiltrate or sizable effusion. Large hiatal hernia is noted. Hepatobiliary: No focal liver abnormality is seen. No gallstones, gallbladder wall thickening, or biliary dilatation. Pancreas: Unremarkable. No pancreatic ductal dilatation or surrounding inflammatory changes. Spleen: Normal in size without focal abnormality. Adrenals/Urinary Tract: Adrenal glands are within normal limits. Kidneys demonstrate a normal enhancement pattern bilaterally normal excretion is noted on delayed images. No ureteral stones are seen. The bladder is well distended. Stomach/Bowel: Scattered diverticular change of the colon is noted without evidence of diverticulitis. A loop of sigmoid colon is noted within a left inguinal hernia without incarceration. The more proximal colon appears within normal limits. The appendix is within normal limits. Small bowel demonstrates multiple dilated loops of jejunum and ileum with a transition zone in the left lower quadrant although no definitive mass is seen. Some inflammatory changes in the distal ileum are seen which likely contribute to the more proximal obstructive process. The stomach is decompressed. Large hiatal hernia is again noted. Vascular/Lymphatic: Aortic atherosclerosis. No enlarged abdominal or pelvic lymph nodes. Reproductive: Prostate is unremarkable. Other: Left inguinal hernia is noted with fluid and a loop of sigmoid colon within. No incarceration is noted. Musculoskeletal:  Chronic T12 compression deformity is noted. No acute bony abnormality is seen. IMPRESSION: Multiple dilated loops of small bowel with a transition zone in the left lower quadrant likely secondary to inflammatory changes in the distal ileum. These changes are consistent with a partial small bowel obstruction. Diverticulosis without diverticulitis. Large hiatal hernia. Left inguinal hernia with a loop of sigmoid colon within although no obstructive changes are noted. Electronically Signed   By: Alcide Clever M.D.   On: 07/26/2020 00:57       LOS: 1 day   Lucendia Leard  [facility] Pager on [email or web address]  07/27/2020, 12:21 PM

## 2020-07-27 NOTE — Progress Notes (Cosign Needed Addendum)
    CC: SBO  Subjective: He is very confused this morning.  He cannot tell me whether he had a bowel movement or not.  Nursing staff reports 5 BMs yesterday.  He is tolerating clear liquids.  He did not know whether his catheter was condom catheter or regular Foley.  I asked about where he lives and who he lives with and he started rambling about the exit his daughter lived on, and then proceeded to discuss other family issues.  He still could not tell me whether he is passing gas or having a bowel movement.  Objective: Vital signs in last 24 hours: Temp:  [97.4 F (36.3 C)-99.1 F (37.3 C)] 97.7 F (36.5 C) (03/08 0532) Pulse Rate:  [64-89] 89 (03/08 0532) Resp:  [15-18] 16 (03/08 0532) BP: (135-169)/(63-77) 169/77 (03/08 0532) SpO2:  [94 %-99 %] 96 % (03/08 0532) Last BM Date: 07/26/20 600 p.o. recorded 1100 IV 2100 urine BM x1 Afebrile vital signs are stable. Potassium 3.7 No films Intake/Output from previous day: 03/07 0701 - 03/08 0700 In: 1736.3 [P.O.:600; I.V.:847.1; IV Piggyback:289.2] Out: 2100 [Urine:2100] Intake/Output this shift: Total I/O In: 720 [P.O.:720] Out: 200 [Urine:200]  General appearance: alert, cooperative and no distress Resp: clear to auscultation bilaterally GI: He sitting up in the chair but he does not feel overly distended.  Does have bowel sounds.  Reported BM.  Lab Results:  Recent Labs    07/26/20 0454 07/27/20 0510  WBC 10.4 7.1  HGB 12.0* 11.6*  HCT 35.8* 35.0*  PLT 238 230    BMET Recent Labs    07/26/20 0454 07/27/20 0510  NA 132* 134*  K 3.4* 3.7  CL 101 104  CO2 22 21*  GLUCOSE 141* 136*  BUN 13 7*  CREATININE 0.76 0.65  CALCIUM 8.5* 8.8*   PT/INR No results for input(s): LABPROT, INR in the last 72 hours.  Recent Labs  Lab 07/25/20 2126 07/26/20 0454  AST 23 18  ALT 19 17  ALKPHOS 68 60  BILITOT 0.9 0.8  PROT 7.3 6.3*  ALBUMIN 4.1 3.5     Lipase     Component Value Date/Time   LIPASE 28  07/25/2020 2126     Medications: . latanoprost  1 drop Both Eyes QHS  . timolol  1 drop Both Eyes BID    Assessment/Plan DM HTN Hiatal hernia Left inguinal hernia Confusion  PSBO  FEN - CLD/IVFs VTE - ok for lovenox from our standpoint ID - none currently needed  Plan: I ordered plain film on him just to make sure he is okay, I cannot really tell from his history.  The film shows SBO is better I would go ahead and advance his diet to full liquids.  Defer to Dr. Rito Ehrlich on evaluating his confusion.  I would recommend additional potassium to get his potassium up around 4.0.  Film 3/8:  Previously identified prominent small bowel distention has significantly improved. Findings consistent with improving small bowel obstruction. 2. Left inguinal hernia again noted. Colon is not distended.     LOS: 1 day    JENNINGS,WILLARD 07/27/2020 Please see Amion

## 2020-07-28 ENCOUNTER — Inpatient Hospital Stay (HOSPITAL_COMMUNITY): Payer: Medicare Other

## 2020-07-28 DIAGNOSIS — G9341 Metabolic encephalopathy: Secondary | ICD-10-CM

## 2020-07-28 DIAGNOSIS — R569 Unspecified convulsions: Secondary | ICD-10-CM

## 2020-07-28 DIAGNOSIS — K56609 Unspecified intestinal obstruction, unspecified as to partial versus complete obstruction: Secondary | ICD-10-CM

## 2020-07-28 DIAGNOSIS — R1032 Left lower quadrant pain: Secondary | ICD-10-CM

## 2020-07-28 LAB — CBC WITH DIFFERENTIAL/PLATELET
Abs Immature Granulocytes: 0.02 10*3/uL (ref 0.00–0.07)
Basophils Absolute: 0 10*3/uL (ref 0.0–0.1)
Basophils Relative: 0 %
Eosinophils Absolute: 0 10*3/uL (ref 0.0–0.5)
Eosinophils Relative: 0 %
HCT: 33 % — ABNORMAL LOW (ref 39.0–52.0)
Hemoglobin: 11.6 g/dL — ABNORMAL LOW (ref 13.0–17.0)
Immature Granulocytes: 0 %
Lymphocytes Relative: 19 %
Lymphs Abs: 1.5 10*3/uL (ref 0.7–4.0)
MCH: 32.8 pg (ref 26.0–34.0)
MCHC: 35.2 g/dL (ref 30.0–36.0)
MCV: 93.2 fL (ref 80.0–100.0)
Monocytes Absolute: 0.5 10*3/uL (ref 0.1–1.0)
Monocytes Relative: 7 %
Neutro Abs: 5.6 10*3/uL (ref 1.7–7.7)
Neutrophils Relative %: 74 %
Platelets: 241 10*3/uL (ref 150–400)
RBC: 3.54 MIL/uL — ABNORMAL LOW (ref 4.22–5.81)
RDW: 12.6 % (ref 11.5–15.5)
WBC: 7.6 10*3/uL (ref 4.0–10.5)
nRBC: 0 % (ref 0.0–0.2)

## 2020-07-28 LAB — CSF CELL COUNT WITH DIFFERENTIAL
RBC Count, CSF: 2 /mm3 — ABNORMAL HIGH
RBC Count, CSF: 39 /mm3 — ABNORMAL HIGH
Tube #: 1
Tube #: 4
WBC, CSF: 0 /mm3 (ref 0–5)
WBC, CSF: 0 /mm3 (ref 0–5)

## 2020-07-28 LAB — COMPREHENSIVE METABOLIC PANEL
ALT: 16 U/L (ref 0–44)
AST: 26 U/L (ref 15–41)
Albumin: 3.3 g/dL — ABNORMAL LOW (ref 3.5–5.0)
Alkaline Phosphatase: 62 U/L (ref 38–126)
Anion gap: 9 (ref 5–15)
BUN: 6 mg/dL — ABNORMAL LOW (ref 8–23)
CO2: 24 mmol/L (ref 22–32)
Calcium: 9 mg/dL (ref 8.9–10.3)
Chloride: 101 mmol/L (ref 98–111)
Creatinine, Ser: 0.82 mg/dL (ref 0.61–1.24)
GFR, Estimated: >60 mL/min (ref >60)
Glucose, Bld: 127 mg/dL — ABNORMAL HIGH (ref 70–99)
Potassium: 3.6 mmol/L (ref 3.5–5.1)
Sodium: 134 mmol/L — ABNORMAL LOW (ref 135–145)
Total Bilirubin: 0.6 mg/dL (ref 0.3–1.2)
Total Protein: 6.2 g/dL — ABNORMAL LOW (ref 6.5–8.1)

## 2020-07-28 LAB — TYPE AND SCREEN
ABO/RH(D): O POS
Antibody Screen: NEGATIVE

## 2020-07-28 LAB — CK: Total CK: 324 U/L (ref 49–397)

## 2020-07-28 LAB — CBC
HCT: 33.1 % — ABNORMAL LOW (ref 39.0–52.0)
Hemoglobin: 10.9 g/dL — ABNORMAL LOW (ref 13.0–17.0)
MCH: 32 pg (ref 26.0–34.0)
MCHC: 32.9 g/dL (ref 30.0–36.0)
MCV: 97.1 fL (ref 80.0–100.0)
Platelets: 221 10*3/uL (ref 150–400)
RBC: 3.41 MIL/uL — ABNORMAL LOW (ref 4.22–5.81)
RDW: 12.8 % (ref 11.5–15.5)
WBC: 6.9 10*3/uL (ref 4.0–10.5)
nRBC: 0 % (ref 0.0–0.2)

## 2020-07-28 LAB — GLUCOSE, CAPILLARY
Glucose-Capillary: 130 mg/dL — ABNORMAL HIGH (ref 70–99)
Glucose-Capillary: 152 mg/dL — ABNORMAL HIGH (ref 70–99)
Glucose-Capillary: 105 mg/dL — ABNORMAL HIGH (ref 70–99)
Glucose-Capillary: 126 mg/dL — ABNORMAL HIGH (ref 70–99)
Glucose-Capillary: 147 mg/dL — ABNORMAL HIGH (ref 70–99)

## 2020-07-28 LAB — PHOSPHORUS
Phosphorus: 1.9 mg/dL — ABNORMAL LOW (ref 2.5–4.6)
Phosphorus: 2.4 mg/dL — ABNORMAL LOW (ref 2.5–4.6)

## 2020-07-28 LAB — LACTIC ACID, PLASMA
Lactic Acid, Venous: 2.1 mmol/L (ref 0.5–1.9)
Lactic Acid, Venous: 2.1 mmol/L (ref 0.5–1.9)

## 2020-07-28 LAB — APTT: aPTT: 31 seconds (ref 24–36)

## 2020-07-28 LAB — PROTIME-INR
INR: 1.1 (ref 0.8–1.2)
Prothrombin Time: 14.1 seconds (ref 11.4–15.2)

## 2020-07-28 LAB — MAGNESIUM
Magnesium: 1.8 mg/dL (ref 1.7–2.4)
Magnesium: 2 mg/dL (ref 1.7–2.4)

## 2020-07-28 LAB — PROTEIN AND GLUCOSE, CSF
Glucose, CSF: 85 mg/dL — ABNORMAL HIGH (ref 40–70)
Total  Protein, CSF: 30 mg/dL (ref 15–45)

## 2020-07-28 LAB — BASIC METABOLIC PANEL
Anion gap: 8 (ref 5–15)
BUN: 7 mg/dL — ABNORMAL LOW (ref 8–23)
CO2: 21 mmol/L — ABNORMAL LOW (ref 22–32)
Calcium: 8.7 mg/dL — ABNORMAL LOW (ref 8.9–10.3)
Chloride: 104 mmol/L (ref 98–111)
Creatinine, Ser: 0.69 mg/dL (ref 0.61–1.24)
GFR, Estimated: >60 mL/min (ref >60)
Glucose, Bld: 131 mg/dL — ABNORMAL HIGH (ref 70–99)
Potassium: 3.6 mmol/L (ref 3.5–5.1)
Sodium: 133 mmol/L — ABNORMAL LOW (ref 135–145)

## 2020-07-28 LAB — ABO/RH: ABO/RH(D): O POS

## 2020-07-28 LAB — MRSA PCR SCREENING: MRSA by PCR: NEGATIVE

## 2020-07-28 MED ORDER — LORAZEPAM 2 MG/ML IJ SOLN
2.0000 mg | Freq: Once | INTRAMUSCULAR | Status: AC
  Administered 2020-07-28: 2 mg via INTRAVENOUS

## 2020-07-28 MED ORDER — LEVETIRACETAM IN NACL 1500 MG/100ML IV SOLN: 1500.0000 mg | Freq: Two times a day (BID) | INTRAVENOUS | Status: DC

## 2020-07-28 MED ORDER — SODIUM CHLORIDE 0.9 % IV SOLN
4000.0000 mg | INTRAVENOUS | Status: AC
  Administered 2020-07-28: 4000 mg via INTRAVENOUS
  Filled 2020-07-28: qty 40

## 2020-07-28 MED ORDER — CHLORHEXIDINE GLUCONATE CLOTH 2 % EX PADS
6.0000 | MEDICATED_PAD | Freq: Every day | CUTANEOUS | Status: DC
  Administered 2020-07-28 – 2020-08-02 (×6): 6 via TOPICAL

## 2020-07-28 MED ORDER — POTASSIUM PHOSPHATES 15 MMOLE/5ML IV SOLN: 30.0000 mmol | Freq: Once | INTRAVENOUS | Status: DC

## 2020-07-28 MED ORDER — SODIUM CHLORIDE 0.9 % IV SOLN
400.0000 mg | INTRAVENOUS | Status: AC
  Administered 2020-07-28: 400 mg via INTRAVENOUS
  Filled 2020-07-28: qty 40

## 2020-07-28 MED ORDER — LORAZEPAM 2 MG/ML IJ SOLN
INTRAMUSCULAR | Status: AC
  Administered 2020-07-28: 1 mg
  Filled 2020-07-28: qty 1

## 2020-07-28 MED ORDER — LORAZEPAM 1 MG PO TABS
2.0000 mg | ORAL_TABLET | Freq: Two times a day (BID) | ORAL | Status: DC
  Administered 2020-07-29 – 2020-08-03 (×9): 2 mg via ORAL
  Filled 2020-07-28 (×9): qty 2

## 2020-07-28 MED ORDER — LORAZEPAM 2 MG/ML IJ SOLN
2.0000 mg | Freq: Four times a day (QID) | INTRAMUSCULAR | Status: DC | PRN
  Administered 2020-07-28: 2 mg via INTRAVENOUS
  Filled 2020-07-28: qty 1

## 2020-07-28 MED ORDER — LORAZEPAM 2 MG/ML IJ SOLN: 2.0000 mg | Freq: Once | INTRAMUSCULAR | Status: DC

## 2020-07-28 MED ORDER — LORAZEPAM 2 MG/ML IJ SOLN
0.5000 mg | INTRAMUSCULAR | Status: DC
  Filled 2020-07-28: qty 1

## 2020-07-28 MED ORDER — LACTATED RINGERS IV BOLUS
1000.0000 mL | Freq: Once | INTRAVENOUS | Status: AC
  Administered 2020-07-28: 1000 mL via INTRAVENOUS

## 2020-07-28 MED ORDER — TEMAZEPAM 30 MG PO CAPS: 30.0000 mg | ORAL_CAPSULE | Freq: Every day | ORAL | Status: DC

## 2020-07-28 MED ORDER — POTASSIUM PHOSPHATES 15 MMOLE/5ML IV SOLN
20.0000 mmol | Freq: Once | INTRAVENOUS | Status: AC
  Administered 2020-07-28: 20 mmol via INTRAVENOUS
  Filled 2020-07-28 (×2): qty 6.67

## 2020-07-28 MED ORDER — LORAZEPAM 2 MG/ML IJ SOLN
2.0000 mg | Freq: Two times a day (BID) | INTRAMUSCULAR | Status: DC
  Administered 2020-07-28: 2 mg via INTRAVENOUS
  Filled 2020-07-28 (×2): qty 1

## 2020-07-28 MED ORDER — MAGNESIUM SULFATE 2 GM/50ML IV SOLN
2.0000 g | Freq: Once | INTRAVENOUS | Status: DC
  Filled 2020-07-28: qty 50

## 2020-07-28 MED ORDER — MAGNESIUM SULFATE 2 GM/50ML IV SOLN
2.0000 g | Freq: Once | INTRAVENOUS | Status: AC
  Administered 2020-07-28: 2 g via INTRAVENOUS
  Filled 2020-07-28: qty 50

## 2020-07-28 MED ORDER — MAGNESIUM SULFATE 2 GM/50ML IV SOLN
INTRAVENOUS | Status: AC
  Administered 2020-07-28: 2 g
  Filled 2020-07-28: qty 50

## 2020-07-28 MED ORDER — TEMAZEPAM 15 MG PO CAPS
30.0000 mg | ORAL_CAPSULE | Freq: Every day | ORAL | Status: DC
  Administered 2020-07-29 – 2020-08-02 (×5): 30 mg via ORAL
  Filled 2020-07-28 (×5): qty 2

## 2020-07-28 MED ORDER — SODIUM CHLORIDE 0.9 % IV SOLN
150.0000 mg | Freq: Two times a day (BID) | INTRAVENOUS | Status: DC
  Filled 2020-07-28: qty 15

## 2020-07-28 MED ORDER — GADOBUTROL 1 MMOL/ML IV SOLN
7.0000 mL | Freq: Once | INTRAVENOUS | Status: AC | PRN
  Administered 2020-07-28: 7 mL via INTRAVENOUS

## 2020-07-28 MED ORDER — LORAZEPAM 2 MG/ML IJ SOLN: 2.0000 mg | INTRAMUSCULAR | Status: DC | PRN

## 2020-07-28 NOTE — Significant Event (Signed)
Rapid Response Event Note   Reason for Call :  Seizure activity, witnessed by staff. Lasted approximately one minute. Nurse reports patient is postictal.  Initial Focused Assessment:  Patient is awake and alert to self and  following commands, remains disoriented, attempting to get out of bed. Patient is redirectable.  See neuro assessment. See  Vital sign sheet. Patient reports that he needs to void, patient voided small amount bladder scan completed.    Interventions:  Notify MD Oral ativan ? Recruitment consultant.  Bladder scan   Plan of Care:  PRN ativan In and out Cath MRI EEG Neuro Consult.   Event Summary:  Patient had a witnessed  seizure for approximately  one minute and was postictal for 1-2 minutes.   Dr. Marland Mcalpine notified new orders noted.  In and out cath for urinary retention.   MD Notified:  Marland Mcalpine Call Time: 0730 Arrival Time: 0735 End Time: 0830 Sharyn Lull Tkai Large, RN

## 2020-07-28 NOTE — Progress Notes (Signed)
NT stated she thought patient was having seizures, upon initial examination patient was unresponsive. RR was called, vitals obtained, bp elevated. MD was notified and new orders were placed. Pt was bladder scanned it showed 300, we IO cath him and got out 500cc's of urine. Pt is agitated and restless but alert and oriented x4 at this time.  Neuro team consulted.

## 2020-07-28 NOTE — Procedures (Signed)
Indication: Status epilepticus  Risks of the procedure were dicussed with the patient including post-LP headache, bleeding, infection, weakness/numbness of legs(radiculopathy), death.  The patient/patient's proxy agreed and written consent was obtained.   The patient was prepped and draped, and using sterile technique a 20 gauge quinke spinal needle was inserted in the L3/L4 space. The opening pressure was 18 cc, closing pressure was not obtained given that opening pressure was normal. Approximately 10 cc of clear CSF were obtained and sent for analysis.  The patient tolerated the procedure well and there were no immediate periprocedural complications.  Brooke Dare MD-PhD [facility] [phone]

## 2020-07-28 NOTE — Consult Note (Signed)
NAME:  Russell Ayers, MRN:  [id number], DOB:  11/16/1932, LOS: 2 ADMISSION DATE:  07/25/2020, CONSULTATION DATE: 07/28/20 REFERRING MD:  Dr. Marland Mcalpine, CHIEF COMPLAINT:  AMS, Seizure    Brief History:  87 y/o M who presented to [facility] on 3/6 with nausea and lower abdominal pain. Work up consistent with SBO.  Which improved, he was ready to be discharged to SNF then he started having seizures  History of Present Illness:  87 y/o M who presented to [facility] on 3/6 via EMS with reports of 8 hour hx of lower abdominal pain and nausea but no vomiting.   Work up included a CT of the abd/pelvis with contrast which was notable for multiple dilated loops of small bowel with a transition zone in the LLQ likely secondary to inflammatory changes in the distal ileum, these changes are consistent with a partial small bowel obstruction.  The patient was admitted for further work up. General Surgery was consulted for evaluation. He was initially managed conservatively for SBO with clear liquids and observation. His abdomen was soft, inguinal hernia was reducible, he continued to have BM's and no further vomiting.  Follow up KUB 3/8 showed improving small bowel obstruction. The patient was noted to have AMS / confusion 3/8 without focal deficits and a CT of the head was evaluated which showed no acute intracranial abnormality. Labs were notable for hyponatremia (132-134 Na), hypophosphatemia (1.9), hypokalemia (3.4-3.6) and anemia (Hgb 12 > 10.9).  He was improved and pending discharge to SNF.   Early am 3/9, he developed witnessed seizure activity.  After the first seizure he was disoriented but alert / able to follow commands per RRT RN note. Seizure abated spontaneously.  He subsequently had 3 total witnessed seizures requiring ativan for cessation.  He was post-ictal / somnolent after seizure / ativan.  STAT MRI was done which showed no acute process.  Neurology consulted and the patient was loaded with IV keppra and was started  on Vimpat then he was transferred to [facility] neuro ICU.   Past Medical History:  DM II HTN Constipation  Inguinal Hernia Glaucoma Anxiety  Insomnia   Significant Hospital Events:  3/06 Admit with abdominal pain in the setting of SBO  Consults:  Neurology General surgery PCCM  Procedures:    Significant Diagnostic Tests:   CT ABD/Pelvis w Contrast 3/7 >> multiple dilated loops of small bowel with a transition zone in the LLQ likely secondary to inflammatory changes in the distal ileum, these changes are consistent with a partial small bowel obstruction, diverticulosis without diverticulitis, large hiatal hernia, left inguinal  Hernia with a loop of sigmoid colon within although no obstructive changes noted   CT Head 3/8 >> no acute abnormality, chronic microvascular ischemic changes   EEG 3/9 >>  MRI brain 3/9  >> 1. No acute intracranial abnormality.Moderate atrophy and white matter disease likely reflects the sequela of chronic microvascular ischemia.  Micro Data:  COVID 3/7 >> negative  Influenza A/B 3/7 >> negative   Antimicrobials:    Interim History / Subjective:  Patient was transferred to [facility] for LTM  Objective   Blood pressure (!) 118/55, pulse 99, temperature 97.9 F (36.6 C), temperature source Oral, resp. rate 20, height 5\' 11"  (1.803 m), weight 75.3 kg, SpO2 97 %.        Intake/Output Summary (Last 24 hours) at 07/28/2020 1125 Last data filed at 07/28/2020 1000 Gross per 24 hour  Intake 1520 ml  Output 2400  ml  Net -880 ml   Filed Weights   07/25/20 2113  Weight: 75.3 kg    Examination:   Physical exam: General: Chronically ill-appearing male, lying on the bed, agitated HEENT: [place]/AT, eyes anicteric.    Daily dry mucous membrane, no JVD Neuro: Lethargic but opens eyes with vocal stimuli, intermittently following commands, moving all 4 extremities spontaneously Chest: Coarse breath sounds, no wheezes or rhonchi Heart: Regular  rate and rhythm, no murmurs or gallops Abdomen: Soft, nontender, nondistended, bowel sounds present Skin: No rash, angular cheilosis noted  Resolved Hospital Problem list   SBO  Assessment & Plan:   Status Epilepticus  New onset seizure 3/9, note patient takes 2mg  BID of ativan at home, was on PRN dosing 0.5 and received it 1x since admission.  ? Benzo withdrawal.   -appreciate Neurology evaluation  -PRN ativan for seizure MRI brain is negative for acute findings He will be started on LTM -seizure precautions Will obtain lumbar puncture to rule out meningitis/encephalitis  Acute Metabolic Encephalopathy  S/p seizure, postictal state with ativan administration. CT head negative  Monitor electrolytes Continue neurochecks every 1 hour  SBO  CT ABD with transition point in the terminal ileum.  -per [facility]  -resolving per imaging as of 3/8 -NPO now that he is post ictal  -follow abdominal exam  General surgery follow-up is appreciated  Left Inguinal Hernia  Reproducible on prior exam notes by [facility] -defer follow up to [facility]   Hx HTN  -hold home benazepril, HCTZ -follow BP trend   Hyponatremia  Hypokalemia  Hypophosphatemia  -monitor, replace as indicated  -NS at 57ml/hr   Normocytic Anemia  No hx colonoscopy per prior notes.  -trend CBC -follow up anemia panel (ordered per [facility]) -monitor for bleeding   Hx BPH  -finasteride, terazosin when able to take PO's  Glaucoma  -timolol, latanoprost QHS   Best practice (evaluated daily)  Diet: NPO  Pain/Anxiety/Delirium protocol (if indicated): n/a  VAP protocol (if indicated): n/a  DVT prophylaxis: SCD GI prophylaxis: N/A Glucose control: N/A Mobility: Bedrest Disposition: ICU  Goals of Care:  Last date of multidisciplinary goals of care discussion: Pending Family and staff present:  Summary of discussion:  Follow up goals of care discussion due:  Code Status: Full code  Labs   CBC: Recent Labs  Lab  07/25/20 2126 07/26/20 0454 07/27/20 0510 07/28/20 0437  WBC 10.6* 10.4 7.1 6.9  NEUTROABS 8.0*  --   --   --   HGB 13.0 12.0* 11.6* 10.9*  HCT 38.2* 35.8* 35.0* 33.1*  MCV 93.9 95.0 96.2 97.1  PLT 277 238 230 221    Basic Metabolic Panel: Recent Labs  Lab 07/25/20 2126 07/26/20 0454 07/27/20 0510 07/28/20 0437  NA 130* 132* 134* 133*  K 3.7 3.4* 3.7 3.6  CL 95* 101 104 104  CO2 23 22 21* 21*  GLUCOSE 164* 141* 136* 131*  BUN 12 13 7* 7*  CREATININE 0.88 0.76 0.65 0.69  CALCIUM 9.3 8.5* 8.8* 8.7*  MG  --   --  1.9 1.8  PHOS  --   --   --  1.9*   GFR: Estimated Creatinine Clearance: 70.6 mL/min (by C-G formula based on SCr of 0.69 mg/dL). Recent Labs  Lab 07/25/20 2126 07/26/20 0454 07/27/20 0510 07/28/20 0437  WBC 10.6* 10.4 7.1 6.9    Liver Function Tests: Recent Labs  Lab 07/25/20 2126 07/26/20 0454  AST 23 18  ALT 19 17  ALKPHOS 68 60  BILITOT 0.9 0.8  PROT 7.3 6.3*  ALBUMIN 4.1 3.5   Recent Labs  Lab 07/25/20 2126  LIPASE 28   No results for input(s): AMMONIA in the last 168 hours.  ABG No results found for: PHART, PCO2ART, PO2ART, HCO3, TCO2, ACIDBASEDEF, O2SAT   Coagulation Profile: No results for input(s): INR, PROTIME in the last 168 hours.  Cardiac Enzymes: No results for input(s): CKTOTAL, CKMB, CKMBINDEX, TROPONINI in the last 168 hours.  HbA1C: No results found for: HGBA1C  CBG: Recent Labs  Lab 07/27/20 1735 07/27/20 1943 07/27/20 2344 07/28/20 0435 07/28/20 0737  GLUCAP 99 170* 130* 130* 152*    Review of Systems:   Unable to complete as patient is post-ictal.   Past Medical History:  He,  has a past medical history of Diabetes mellitus without complication (HCC), Generalized anxiety disorder, Glaucoma, Hypertension, Inguinal hernia, Insomnia, and Prostatism.   Surgical History:   Past Surgical History:  Procedure Laterality Date  . CATARACT EXTRACTION, BILATERAL    . glaucoma       Social History:    reports that he quit smoking about 31 years ago. He has never used smokeless tobacco. He reports that he does not drink alcohol and does not use drugs.   Family History:  His family history includes Cancer in his brother; Heart attack in his brother.   Allergies No Known Allergies   Home Medications  Prior to Admission medications   Medication Sig Start Date End Date Taking? Authorizing Provider  aspirin EC 81 MG tablet Take 162 mg by mouth every 4 (four) hours as needed for mild pain. Swallow whole.   Yes [provider]  benazepril (LOTENSIN) 10 MG tablet Take 10 mg by mouth daily. 10/20/19  Yes [provider]  cetirizine (ZYRTEC) 10 MG chewable tablet Chew 10-20 mg by mouth See admin instructions. 20mg  in the morning  10mg  at noon 10mg  in the afternoon   Yes [provider]  diphenhydrAMINE (BENADRYL) 25 MG tablet Take 25 mg by mouth at bedtime.   Yes [provider]  finasteride (PROSCAR) 5 MG tablet Take 5 mg by mouth daily. 09/30/18  Yes [provider]  fluticasone (FLONASE) 50 MCG/ACT nasal spray Place 1-2 sprays into both nostrils daily as needed for allergies.   Yes [provider]  hydrochlorothiazide (HYDRODIURIL) 12.5 MG tablet Take 12.5 mg by mouth daily. 09/21/18  Yes [provider]  latanoprost (XALATAN) 0.005 % ophthalmic solution Place 1 drop into both eyes at bedtime. 11/20/18  Yes [provider]  LORazepam (ATIVAN) 2 MG tablet Take 2 mg by mouth 2 (two) times daily.  11/19/18  Yes [provider]  Melatonin 10 MG TABS Take 20-40 mg by mouth See admin instructions. 20mg  every night And 20mg  more if needed for sleep   Yes [provider]  PSYLLIUM HUSK PO Take 1 tablet by mouth 3 (three) times daily.   Yes [provider]  Saw Palmetto, Serenoa repens, (SAW PALMETTO PO) Take 1 tablet by mouth daily.   Yes [provider]  temazepam (RESTORIL) 30 MG capsule Take 30 mg by  mouth at bedtime. 11/19/18  Yes [provider]  terazosin (HYTRIN) 1 MG capsule Take 1 mg by mouth at bedtime. 09/30/18  Yes [provider]  timolol (TIMOPTIC) 0.5 % ophthalmic solution Place 1 drop into both eyes daily. 09/20/18  Yes [provider]     Total critical care time: 48 minutes  Performed by:  Laterria Lasota   Critical care time was exclusive of separately billable procedures and treating other patients.   Critical care was necessary to treat or prevent imminent or life-threatening deterioration.   Critical care was time spent personally by me on the following activities: development of treatment plan with patient and/or surrogate as well as nursing, discussions with consultants, evaluation of patient's response to treatment, examination of patient, obtaining history from patient or surrogate, ordering and performing treatments and interventions, ordering and review of laboratory studies, ordering and review of radiographic studies, pulse oximetry and re-evaluation of patient's condition.   Cheri FowlerSudham Angelgabriel Willmore MD [facility] See Amion for pager If no response to pager, please call [phone] until 7pm After 7pm, Please call E-link [phone]

## 2020-07-28 NOTE — Progress Notes (Incomplete)
    CC: SBO  Subjective: Patient reportedly had a seizure this morning, neurology consult/MRI is pending  Objective: Vital signs in last 24 hours: Temp:  [97.6 F (36.4 C)-98.3 F (36.8 C)] 97.9 F (36.6 C) (03/09 0847) Pulse Rate:  [41-129] 95 (03/09 0847) Resp:  [17-23] 18 (03/09 0847) BP: (113-189)/(55-124) 113/61 (03/09 0847) SpO2:  [95 %-100 %] 95 % (03/09 0847) Last BM Date: 07/26/20 2040 p.o. recorded 200 IV 1800 urine No BM recorded Afebrile, vital signs are stable NA 133, glucose 131, potassium 3.6 WBC 6.9 H/H 10.9/33.1 Platelets 221,000 Plain film yesterday showed significant improvement small bowel distention, left inguinal hernia: Was not distended. Intake/Output from previous day: 03/08 0701 - 03/09 0700 In: 2240 [P.O.:2040; IV Piggyback:200] Out: 1800 [Urine:1800] Intake/Output this shift: Total I/O In: -  Out: 800 [Other:800]  {physical exam:3041130}  Lab Results:  Recent Labs    07/27/20 0510 07/28/20 0437  WBC 7.1 6.9  HGB 11.6* 10.9*  HCT 35.0* 33.1*  PLT 230 221    BMET Recent Labs    07/27/20 0510 07/28/20 0437  NA 134* 133*  K 3.7 3.6  CL 104 104  CO2 21* 21*  GLUCOSE 136* 131*  BUN 7* 7*  CREATININE 0.65 0.69  CALCIUM 8.8* 8.7*   PT/INR No results for input(s): LABPROT, INR in the last 72 hours.  Recent Labs  Lab 07/25/20 2126 07/26/20 0454  AST 23 18  ALT 19 17  ALKPHOS 68 60  BILITOT 0.9 0.8  PROT 7.3 6.3*  ALBUMIN 4.1 3.5     Lipase     Component Value Date/Time   LIPASE 28 07/25/2020 2126     Medications: . benazepril  10 mg Oral Daily  . finasteride  5 mg Oral Daily  . latanoprost  1 drop Both Eyes QHS  . LORazepam  0.5 mg Intravenous On Call  . temazepam  30 mg Oral QHS  . terazosin  1 mg Oral QHS  . timolol  1 drop Both Eyes BID     Assessment/Plan DM HTN Hiatal hernia Left inguinal hernia Confusion  PSBO  FEN -full liquids/ IVFs VTE -ok for lovenox from our standpoint ID  -none currently needed    ***   LOS: 2 days    JENNINGS,WILLARD 07/28/2020 Please see Amion

## 2020-07-28 NOTE — Procedures (Addendum)
Patient Name: Russell Ayers  MRN: [id number]  Epilepsy Attending: Charlsie Quest  Referring Physician/Provider: Dr Haig Prophet Date: 07/28/2020 Duration: 23.17 mins  Patient history: 87 year old male with seizure-like episodes.  EEG to evaluate for seizure  Level of alertness: Awake  AEDs during EEG study: Ativan  Technical aspects: This EEG study was done with scalp electrodes positioned according to the 10-20 International system of electrode placement. Electrical activity was acquired at a sampling rate of 500Hz  and reviewed with a high frequency filter of 70Hz  and a low frequency filter of 1Hz . EEG data were recorded continuously and digitally stored.   Description:  EEG showed continuous generalized 3 to 6 Hz theta-delta slowing with overriding 15 to 18 Hz beta activity distributed symmetrically and diffusely. Hyperventilation and photic stimulation were not performed.     ABNORMALITY -Excessive beta, generalized -Continuous slow, generalized  IMPRESSION: This study is suggestive of moderate diffuse encephalopathy, nonspecific etiology. No seizures or epileptiform discharges were seen throughout the recording. The excessive beta activity seen in the background is most likely due to the effect of benzodiazepine and is a benign EEG pattern.   Zuleyma Scharf 

## 2020-07-28 NOTE — Progress Notes (Signed)
Brief Note  While walking by room, RN called out that patient was having seizure.  Dr. Chesley Mires and I entered room.  Patient with noted seizure activity that stopped for a period, then restarted without him waking or being responsive.  IV ativan was given and rapid response called.  Patient primary physician came to the bedside and evaluated patient.  Neuro consulted for recommendations.  Romie Minus, MD [facility] Palliative Medicine Team [phone]  NO CHARGE NOTE

## 2020-07-28 NOTE — Progress Notes (Signed)
Subjective: CC: Patient transferred from [facility] after having multiple episodes of seizure like activity. MRI brain negative for acute intracranial abnormality. Neurology was consulted, patient was started on IV Keppra and was [facility].   Currently in ICU. He is A&O x 3. He reports no abdominal pain, n/v. Unclear if any flatus. Last BM documented 3/7.   Objective: Vital signs in last 24 hours: Temp:  [97.6 F (36.4 C)-98.3 F (36.8 C)] 97.9 F (36.6 C) (03/09 0847) Pulse Rate:  [41-129] 99 (03/09 1001) Resp:  [17-23] 20 (03/09 1001) BP: (113-189)/(55-124) 118/55 (03/09 1001) SpO2:  [95 %-100 %] 97 % (03/09 1001) Last BM Date: 07/26/20  Intake/Output from previous day: 03/08 0701 - 03/09 0700 In: 2240 [P.O.:2040; IV Piggyback:200] Out: 1800 [Urine:1800] Intake/Output this shift: Total I/O In: 0  Out: 800 [Other:800]  PE: Gen:  Alert, NAD, pleasant Pulm:  Normal rate and effort  Abd: Soft, ND, completely NT on light and deep palpation, normoactive BS, left inguinal hernia reducible and without skin changes.   Psych: A&Ox3  Skin: no rashes noted, warm and dry   Lab Results:  Recent Labs    07/27/20 0510 07/28/20 0437  WBC 7.1 6.9  HGB 11.6* 10.9*  HCT 35.0* 33.1*  PLT 230 221   BMET Recent Labs    07/27/20 0510 07/28/20 0437  NA 134* 133*  K 3.7 3.6  CL 104 104  CO2 21* 21*  GLUCOSE 136* 131*  BUN 7* 7*  CREATININE 0.65 0.69  CALCIUM 8.8* 8.7*   PT/INR No results for input(s): LABPROT, INR in the last 72 hours. CMP     Component Value Date/Time   NA 133 (L) 07/28/2020 0437   NA 135 02/19/2019 1113   K 3.6 07/28/2020 0437   CL 104 07/28/2020 0437   CO2 21 (L) 07/28/2020 0437   GLUCOSE 131 (H) 07/28/2020 0437   BUN 7 (L) 07/28/2020 0437   BUN 9 02/19/2019 1113   CREATININE 0.69 07/28/2020 0437   CALCIUM 8.7 (L) 07/28/2020 0437   PROT 6.3 (L) 07/26/2020 0454   PROT 6.7 02/19/2019 1113   ALBUMIN 3.5 07/26/2020 0454   ALBUMIN 4.1  02/19/2019 1113   AST 18 07/26/2020 0454   ALT 17 07/26/2020 0454   ALKPHOS 60 07/26/2020 0454   BILITOT 0.8 07/26/2020 0454   BILITOT 0.3 02/19/2019 1113   GFRNONAA >60 07/28/2020 0437   GFRAA 83 02/19/2019 1113   Lipase     Component Value Date/Time   LIPASE 28 07/25/2020 2126       Studies/Results: CT HEAD WO CONTRAST  Result Date: 07/27/2020 CLINICAL DATA:  Mental status change EXAM: CT HEAD WITHOUT CONTRAST TECHNIQUE: Contiguous axial images were obtained from the base of the skull through the vertex without intravenous contrast. COMPARISON:  None. FINDINGS: Brain: There is no acute intracranial hemorrhage, mass effect, or edema. Gray-white differentiation is preserved. Prominence of the ventricles and sulci reflects generalized parenchymal volume loss. Patchy hypoattenuation in the supratentorial white matter is nonspecific but probably reflects mild to moderate chronic microvascular ischemic changes. No definite extra-axial collection. Vascular: There is atherosclerotic calcification at the skull base. Skull: Calvarium is unremarkable. Sinuses/Orbits: No acute finding. Other: None. IMPRESSION: No acute intracranial abnormality. Chronic microvascular ischemic changes. Electronically Signed   By: Guadlupe Spanish M.D.   On: 07/27/2020 17:22   MR BRAIN W WO CONTRAST  Result Date: 07/28/2020 CLINICAL DATA:  Seizure.  Mental status change. EXAM: MRI HEAD  WITHOUT AND WITH CONTRAST TECHNIQUE: Multiplanar, multiecho pulse sequences of the brain and surrounding structures were obtained without and with intravenous contrast. CONTRAST:  64mL GADAVIST GADOBUTROL 1 MMOL/ML IV SOLN COMPARISON:  CT head without contrast 07/27/2020 FINDINGS: Brain: Metallic artifact from the oral cavity distorts some imaging of the inferior frontal lobes. Portions of the diffusion and susceptibility images are obscured. No acute infarct, hemorrhage, or mass lesion is present. Moderate atrophy and white matter changes are  present bilaterally. The ventricles are proportionate to the degree of atrophy. The basal ganglia are intact. No significant extraaxial fluid collection is present. Insert normal IAC The brainstem and cerebellum are within normal limits. Postcontrast images demonstrate no pathologic enhancement. Vascular: Flow is present in the major intracranial arteries. Skull and upper cervical spine: Exaggerated cervical lordosis is present. Craniocervical junction is normal. Sinuses/Orbits: The paranasal sinuses and mastoid air cells are clear. Bilateral lens replacements are noted. Globes and orbits are otherwise unremarkable. IMPRESSION: 1. No acute intracranial abnormality. 2. Moderate atrophy and white matter disease likely reflects the sequela of chronic microvascular ischemia. Electronically Signed   By: Marin Roberts M.D.   On: 07/28/2020 11:29   DG Abd Portable 1V  Result Date: 07/27/2020 CLINICAL DATA:  Small-bowel obstruction. EXAM: PORTABLE ABDOMEN - 1 VIEW COMPARISON:  CT 07/26/2020 FINDINGS: Previously identified prominent small bowel distention has significantly improved. Left inguinal hernia again noted. Colon is not distended. Stool in the colon. No free air. Degenerative changes scoliosis lumbar spine. Degenerative changes both hips. Pelvic calcifications consistent phleboliths. Surgical clips noted the left groin. IMPRESSION: 1. Previously identified prominent small bowel distention has significantly improved. Findings consistent with improving small bowel obstruction. 2. Left inguinal hernia again noted. Colon is not distended. Electronically Signed   By: Maisie Fus  Register   On: 07/27/2020 15:08    Anti-infectives: Anti-infectives (From admission, onward)   None       Assessment/Plan DM HTN Hiatal hernia Seizure like activity - Noted AM 3/9. MRI brain negative for intra-cranial abnormalities. Neurology following. Given IV Keppra. Transferred to [facility] at [facility] and currently in ICU  Left  inguinal hernia - This is reducible on exam. Left inguinal hernia with a loop of sigmoid colon within although no obstructive changes are noted on CT. No indication for any surgery at this time and this does not appear to be the site of patients psbo. Can follow up outpatient unless new symptoms develop.   pSBO - CT 3/7 Multiple dilated loops of small bowel with a transition zone in the left lower quadrant likely secondary to inflammatory changes in the distal ileum.  - Per chart review patient was having BM's and tolerating PO's (was on full liquids and took in 2040cc/24 hours documented) prior to seizure like activity. His xray yesterday showed improving small bowel distension. He currently is soft and NT on exam with normoactive BS. No current indication for emergency surgery. His diet could be advanced from our standpoint but in the setting of recent seizure like activity, will likely need speech eval prior to being restarted on a diet. Defer this to medicine.    LOS: 2 days    Jacinto Halim , [facility] Surgery 07/28/2020, 1:30 PM Please see Amion for pager number during day hours 7:00am-4:30pm

## 2020-07-28 NOTE — Progress Notes (Signed)
Dr. Otelia Limes updated that patient failed Yale swallow screen. No new orders at this time. Will continue to monitor.

## 2020-07-28 NOTE — Progress Notes (Signed)
STAT EEG completed; results pending. Dr Yadav notified. 

## 2020-07-28 NOTE — Progress Notes (Signed)
Appropriate Use Committee Chart Review  Chart reviewed by the physician advisor with input from [facility] and the attending MD as needed for review of the appropriateness for SNF referral.  TOC notes, PT/OT/ST notes, nursing notes and physician notes reviewed for medical necessity to determine if the patient's needs are appropriate for short-term rehab to return to a prior level of function versus the likely need for custodial care.  At this time, the patient meets Medicare criteria for SNF placement.   Recommendations: The patient is SNF appropriate for short-term rehab/skilled nursing interventions.  A consult to the Transitions of Care Team should be made to facilitate placement.    Hillery Aldo, MD Chief Physician Advisor  07/28/2020 4:09 PM

## 2020-07-28 NOTE — Progress Notes (Signed)
LTM started; no initial skin breakdown, same leads, event button tested, [facility] notified.

## 2020-07-28 NOTE — Progress Notes (Signed)
Pt actively seizing MD notified pt is transferring to neuro unit at [facility]. IV ativan was ordered and given for seizure, pt sleeping now, new orders placed by MD. Will continue to monitor patient.

## 2020-07-28 NOTE — Progress Notes (Signed)
0945 Witnessed seizure  Ativan 2 mg  Loading dose of Keppra given @ 1000 to MRI on monitor patient stable for transfer, Sat's 97 remains responsive to pain. Maintaining airway VSS   1010 Transport to MRI   1030 Ativan 2 mg given for increased restlessness during MRI  1115 Patient to transfer to [place], 4 [place], [facility] notified of transfer.

## 2020-07-28 NOTE — Consult Note (Addendum)
Neurology Consultation Reason for Consult: Status epilepticus  Requesting Physician: Merlene Laughter  CC: Seizures  History is obtained from: Patient, family, and chart review  HPI: Russell Ayers is a 87 y.o. male with a past medical history significant for diabetes, hypertension, generalized anxiety, glaucoma and insomnia.  Per his family he was in his normal state of health, with no complaints other than some left-sided stomach pain on Sunday which was severe enough that he requested to go to the emergency department.  CT chest abdomen pelvis on 3/7  00:44 revealed a small bowel obstruction, which was improving with conservative management.  On 3/8 at about 5 PM the patient had a head CT done for acute confusion.  This was unrevealing.  This morning (3/9) the patient had a witnessed generalized tonic-clonic seizure that lasted about 1 minute and was associated with postictal confusion.  He was slowly returning to baseline, but before he fully returned to baseline he had a second seizure.  He was loaded with Keppra, 4 g, but then had a third seizure.  At this time I recommended Vimpat as a second agent after EKG to confirm no existing conduction abnormalities.  With Keppra and Vimpat on board the patient's seizures were controlled.  He was transferred to [facility] for closer neurological monitoring, including long-term EEG monitoring.  On further history provided by the family, the patient lives in an independent living facility manages all of his medications, and drives.  He has longstanding insomnia and anxiety for which he takes Ativan 2 mg twice a day as well as temazepam 30 mg nightly as well as melatonin 20 mg nightly +20 mg as needed for inability to sleep, in addition to Benadryl 25 mg nightly  ROS:  Unable to obtain due to altered mental status.   Past Medical History:  Diagnosis Date  . Diabetes mellitus without complication (HCC)   . Generalized anxiety disorder   .  Glaucoma   . Hypertension   . Inguinal hernia   . Insomnia   . Prostatism    Past Surgical History:  Procedure Laterality Date  . CATARACT EXTRACTION, BILATERAL    . glaucoma     Family History  Problem Relation Age of Onset  . Cancer Brother   . Heart attack Brother    Current Outpatient Medications  Medication Instructions  . aspirin EC 162 mg, Oral, Every 4 hours PRN, Swallow whole.  . benazepril (LOTENSIN) 10 mg, Oral, Daily  . cetirizine (ZYRTEC) 10-20 mg, Oral, See admin instructions, 20mg  in the morning 10mg  at noon10mg  in the afternoon  . diphenhydrAMINE (BENADRYL) 25 mg, Oral, Daily at bedtime  . finasteride (PROSCAR) 5 mg, Oral, Daily  . fluticasone (FLONASE) 50 MCG/ACT nasal spray 1-2 sprays, Each Nare, Daily PRN  . hydrochlorothiazide (HYDRODIURIL) 12.5 mg, Oral, Daily  . latanoprost (XALATAN) 0.005 % ophthalmic solution 1 drop, Both Eyes, Daily at bedtime  . LORazepam (ATIVAN) 2 mg, Oral, 2 times daily  . Melatonin 20-40 mg, Oral, See admin instructions, 20mg  every night<BR>And 20mg  more if needed for sleep  . PSYLLIUM HUSK PO 1 tablet, Oral, 3 times daily  . Saw Palmetto, Serenoa repens, (SAW PALMETTO PO) 1 tablet, Oral, Daily  . temazepam (RESTORIL) 30 mg, Oral, Daily at bedtime  . terazosin (HYTRIN) 1 mg, Oral, Daily at bedtime  . timolol (TIMOPTIC) 0.5 % ophthalmic solution 1 drop, Both Eyes, Daily    Social History:  reports that he quit smoking about 31 years ago.  He has never used smokeless tobacco. He reports that he does not drink alcohol and does not use drugs.  Exam: Current vital signs: BP (!) 118/55 (BP Location: Right Arm)   Pulse 99   Temp 97.9 F (36.6 C) (Oral)   Resp 20   Ht 5\' 11"  (1.803 m)   Wt 75.3 kg   SpO2 97%   BMI 23.15 kg/m  Vital signs in last 24 hours: Temp:  [97.6 F (36.4 C)-98.3 F (36.8 C)] 97.9 F (36.6 C) (03/09 0847) Pulse Rate:  [41-129] 99 (03/09 1001) Resp:  [17-23] 20 (03/09 1001) BP:  (113-189)/(55-124) 118/55 (03/09 1001) SpO2:  [95 %-100 %] 97 % (03/09 1001)   Physical Exam  Constitutional: Appears well-developed and well-nourished.  Psych: Affect appropriate to situation, distractible and very confused Eyes: No scleral injection HENT: No oropharyngeal obstruction.  No neck stiffness MSK: no joint deformities.  Cardiovascular: Normal rate and regular rhythm.  Respiratory: Effort normal, non-labored breathing GI: Soft.  No distension. There is no tenderness.  Skin: Warm dry and intact visible skin, circular bruise on the right lateral thigh with central clearing and central induration  Neuro: Mental Status: Patient is extremely drowsy, awakens briefly and follows simple commands but falls asleep without constant stimulation.  Trying to eat his blankets, reports he is 87 years old.  Unable to give any usable history.  Within limits of his mental status there is no clear aphasia or neglect. Cranial Nerves: II: Pupils are 4 mm to 3 mm on the left and 57mm to 2 mm on the right with a coloboma on the right III,IV, VI: EOMI bilaterally V: Facial sensation is symmetric to eyelash brush VII: Facial movement is symmetric to grimace VIII: hearing is intact to voice X/XI: Unable to assess secondary to mental status XII: tongue is midline without atrophy or fasciculations.  Motor: Tone is normal. Bulk is normal.  Difficult to assess strength secondary to somnolence, but equally moves all 4 extremities at least antigravity Sensory: Equally responsive to stimulation in all 4 extremities Deep Tendon Reflexes: 3+ and symmetric throughout Plantars: Toes are mute bilaterally Cerebellar: Unable to assess secondary to patient's mental status    I have reviewed labs in epic and the results pertinent to this consultation are: CMP notable for mild hyponatremia at 134, normal creatinine 0.82, mild hypophosphatemia at 2.4, mild hypoalbuminemia at 3.3, mildly elevated glucose at  127  I have reviewed the images obtained: MRI brain without acute intracranial abnormality  Impression: This is an 87 year old gentleman presenting with new onset seizures.  On further history he takes extensive benzodiazepines at home which he has not been receiving in the setting of his small bowel obstruction since 3/6 evening.  Generalized tonic-clonic seizures 2 to 3 days after cessation of benzodiazepines can be seen secondary to benzodiazepine withdrawal.  However will rule out infection/inflammation with lumbar puncture as this is more sensitive than MRI.  MRI brain was also obtained for seizure work-up as below.  Given his frequent seizures will continue the patient on long-term monitoring  Recommendations: - MRI brain w/ and w/o contrast - EEG LTM - Keppra 4000 mg loading dose completed, ordered for 1500 mg twice daily - Vimpat 400 mg loading dose completed, ordered for 150 mg twice daily - Lumbar puncture with opening pressure, protein, glucose, cell counts and tubes 1 and 4, HSV, VZV, bacterial culture, completed with results pending - If CSF studies are reassuring, will resume home benzodiazepines tonight and discontinue antiseizure medications (  Keppra and Vimpat)   Brooke Dare MD-PhD [facility] [phone] Available 7 AM to 7 PM, outside these hours please contact Neurologist on call listed on AMION   Total critical care time: 76 minutes   Critical care time was exclusive of separately billable procedures and treating other patients.   Critical care was necessary to treat or prevent imminent or life-threatening deterioration.   Critical care was time spent personally by me on the following activities: development of treatment plan with patient and/or surrogate as well as nursing, discussions with consultants/primary team, evaluation of patient's response to treatment, examination of patient, obtaining history from patient or surrogate, ordering and performing  treatments and interventions, ordering and review of laboratory studies, ordering and review of radiographic studies, and re-evaluation of patient's condition as needed, as documented above.

## 2020-07-28 NOTE — Progress Notes (Signed)
eLink Physician-Brief Progress Note Patient Name: Russell Ayers DOB: 23-May-1932 MRN: [id number]   Date of Service  07/28/2020  HPI/Events of Note  Agitation - Nursing request for soft waist belt restraint.   eICU Interventions  Plan: 1. Soft waist belt restraint X 12 hours.      Intervention Category Major Interventions: Delirium, psychosis, severe agitation - evaluation and management  Sommer,Steven Eugene 07/28/2020, 8:53 PM

## 2020-07-28 NOTE — Progress Notes (Signed)
Report was called to Asher Muir, RN on [address] bed [address]. Waiting on [facility] for transport.

## 2020-07-28 NOTE — Progress Notes (Signed)
PROGRESS NOTE    Russell Ayers  ZOX:[id number] DOB: January 31, 1933 DOA: 07/25/2020 PCP: Soundra PilonBrake, Andrew R, FNP   Brief Narrative:  Patient is an 87 year old Caucasian male with a past medical history significant for but not limited to hypertension and prediabetes as well as other comorbidities who presented to the ED with complaints of abdominal pain.  A CT scan of the abdomen pelvis revealed small bowel obstruction and general surgery was consulted.  Patient was hospitalized for further management and his small bowel obstruction appears to have improved.  Yesterday the patient started becoming confused and was unable to tell the physician if he had any episodes of vomiting.  Nursing staff did report bowel movement a day before yesterday.  Today the patient was found to be actively seizing.  Seizure episode lasted less than a minute and then he was postictal.  He was not given any antiepileptics at that time given that this was his first seizure however then he started seizing again.  He was given 2 mg of IV lorazepam and became somnolent.  A stat MRI was obtained as well as an EEG ordered and neurology was consulted.  While in the MRI scanner he ended up having another seizure and neurology recommended starting him and loading him with IV Keppra.  He will be transferred to [facility] as his clinical condition is continued to deteriorate throughout the day transfer to the ICU for likely LTM.  Critical care was consulted for further evaluation recommendations  Assessment & Plan:   Principal Problem:   SBO (small bowel obstruction) (HCC) Active Problems:   Essential hypertension  New onset seizures with concern for status epilepticus -Found to have seizure episodes this morning and sees at least 3 times -Neurology was consulted and recommended transfer to [facility] to the ICU for further evaluation and LTM -Stat MRI as well as EEG to be obtained -Loaded with IV Keppra 60 mg/kg and also had Vimpat  ordered -Repeat blood work now -Seizure precautions and will add IV lorazepam 2 mg every 2 as needed for seizure activity -High risk for clinical decompensation and worsening so will admit to the ICU status -[facility], ICU aware and will do initial ICU consult and then transferred to ICU at [facility]  Small bowel obstruction -CT scan raised concern for transition point in the terminal ileum. Concern raised for inflammatory changes in the distal ileum.  -Patient is encephalopathic and does not appear to be really tender in the right lower quadrant. Patient reports never having had any colonoscopy previously.  -Abdomen seems to be more softer today compared to yesterday.  He remains on clear liquid diet but is now somnolent and unable to take anything p.o. so will need a SLP evaluation once he gets to come General surgery is following.    Left-sided inguinal hernia -Apparently has seen general surgery in the outpatient setting for same. No evidence for bowel incarceration at this time.  Acute Encephalopathy, worsening initially thought to be hospital delirium however now could be secondary and attributable to the new onset seizure -No obvious focal deficits. Patient is a bit confused per daughter. Likely due to being in the hospital. -Stat head CT done yesterday and showed intracranial abnormality and chronic microvascular ischemic changes -Obtaining stat head MRI with and without contrast as well as an EEG -Neurology was consulted for further evaluation and will repeat lab work now -Follow closely and will need delirium precautions as well as seizure precautions  Essential Hypertension -Occasional high readings noted.  Patient currently on as needed hydralazine.   -At home patient is noted to be on benazepril 10 mg p.o. daily and hydrochlorothiazide.  -Should be able to resume this medication gradually once more awake.   -Pressures per protocol; last blood pressure  reading was 118/55  Hyponatremia and hypokalemia Likely due to hypovolemia.  Sodium has improved.  Potassium is normal today.  Magnesium 1.9.    Normocytic Anemia -Hemoglobin/hematocrit went from 12.0/35.8 and has trended down slowly down to 11.6/35.0 yesterday and today is 10.9/33.1 -Check anemia panel in the a.m. -Continue to monitor for signs and symptoms of bleeding; currently no overt bleeding noted -Repeat CBC  BPH -Continue with finasteride 5 mg p.o. daily as well Terazosin 1 mg p.o. nightly  History of Glaucoma Continue with eyedrops with Timolol 1 drop and Latanoprost 0.005% 1 drop Both Eyes qHS  Hypophosphatemia -Patient's Phos level is 1.9 -Replete with IV K-Phos 20 mmol X-continue to monitor and replete as necessary -Repeat Phos level in the a.m.  Hyponatremia -Mild -Sodium is 133 -We will start IV fluid hydration with normal saline at 75 MLS per hour -Continue to monitor and trend and repeat CMP in a.m.   DVT prophylaxis: SCDs Code Status: FULL CODE Family Communication: I spoke to his daughter Kennyth Arnold over the telephone extensively Disposition Plan: Transfer to [facility], ICU for LTM EEG monitoring  Status is: Inpatient  Remains inpatient appropriate because:Ongoing diagnostic testing needed not appropriate for outpatient work up, Unsafe d/c plan, IV treatments appropriate due to intensity of illness or inability to take PO and Inpatient level of care appropriate due to severity of illness   Dispo: The patient is from: Home              Anticipated d/c is to: SNF              Patient currently is not medically stable to d/c.   Difficult to place patient No  Consultants:   Neurology  PCCM  General Surgery    Procedures:  Small bowel protocol  EEG  MRI  Antimicrobials:  Anti-infectives (From admission, onward)   None        Subjective: Seen and examined at bedside and patient was extremely encephalopathic unable to provide a subjective  history given his current condition.  He is very somnolent and drowsy after he received his Ativan.  Was sleeping and did not arouse to verbal stimuli or physical stimuli.  Case was discussed with neurology and he progressively further declining so we will transfer him to the ICU and [facility].  Critical care is involved and will accept the patient in transfer once bed is available.  Objective: Vitals:   07/28/20 0745 07/28/20 0755 07/28/20 0847 07/28/20 1001  BP: (!) 178/110 (!) 189/112 113/61 (!) 118/55  Pulse:  (!) 129 95 99  Resp: (!) 23 (!) Temp:   97.9 F (36.6 C)   TempSrc:   Oral   SpO2:  100% 95% 97%  Weight:      Height:        Intake/Output Summary (Last 24 hours) at 07/28/2020 1101 Last data filed at 07/28/2020 1000 Gross per 24 hour  Intake 1520 ml  Output 2400 ml  Net -880 ml   Filed Weights   07/25/20 2113  Weight: 75.3 kg   Examination: Physical Exam:  Constitutional: Elderly Caucasian male currently in NAD and appears calm and extremely somnolent Eyes: PERRL  with with left pupil slightly being larger than his right but both being reactive, lids and conjunctivae normal, sclerae anicteric  ENMT: External Ears, Nose appear normal.  Neck: Appears normal, supple, no cervical masses, normal ROM, no appreciable thyromegaly; no JVD Respiratory: Diminished to auscultation bilaterally, no wheezing, rales, rhonchi or crackles. Normal respiratory effort and patient is not tachypenic. No accessory muscle use.  Unlabored breathing Cardiovascular: Mildly tachycardic, no murmurs / rubs / gallops. S1 and S2 auscultated.  No appreciable extremity edema Abdomen: Soft, non-tender, non-distended. Bowel sounds positive x4.  GU: Deferred. Musculoskeletal: No clubbing / cyanosis of digits/nails. No joint deformity upper and lower extremities. Skin: No rashes, lesions, ulcers on limited skin evaluation. No induration; Warm and dry.  Neurologic: He is post ictal and does not  follow commands and is extremely somnolent Psychiatric: Impaired judgment and insight.  He is somnolent and drowsy and not alert and oriented x 3. Normal mood and appropriate affect.   Data Reviewed: I have personally reviewed following labs and imaging studies  CBC: Recent Labs  Lab 07/25/20 2126 07/26/20 0454 07/27/20 0510 07/28/20 0437  WBC 10.6* 10.4 7.1 6.9  NEUTROABS 8.0*  --   --   --   HGB 13.0 12.0* 11.6* 10.9*  HCT 38.2* 35.8* 35.0* 33.1*  MCV 93.9 95.0 96.2 97.1  PLT 277 238 230 221   Basic Metabolic Panel: Recent Labs  Lab 07/25/20 2126 07/26/20 0454 07/27/20 0510 07/28/20 0437  NA 130* 132* 134* 133*  K 3.7 3.4* 3.7 3.6  CL 95* 101 104 104  CO2 23 22 21* 21*  GLUCOSE 164* 141* 136* 131*  BUN 12 13 7* 7*  CREATININE 0.88 0.76 0.65 0.69  CALCIUM 9.3 8.5* 8.8* 8.7*  MG  --   --  1.9 1.8  PHOS  --   --   --  1.9*   GFR: Estimated Creatinine Clearance: 70.6 mL/min (by C-G formula based on SCr of 0.69 mg/dL). Liver Function Tests: Recent Labs  Lab 07/25/20 2126 07/26/20 0454  AST 23 18  ALT 19 17  ALKPHOS 68 60  BILITOT 0.9 0.8  PROT 7.3 6.3*  ALBUMIN 4.1 3.5   Recent Labs  Lab 07/25/20 2126  LIPASE 28   No results for input(s): AMMONIA in the last 168 hours. Coagulation Profile: No results for input(s): INR, PROTIME in the last 168 hours. Cardiac Enzymes: No results for input(s): CKTOTAL, CKMB, CKMBINDEX, TROPONINI in the last 168 hours. BNP (last 3 results) No results for input(s): PROBNP in the last 8760 hours. HbA1C: No results for input(s): HGBA1C in the last 72 hours. CBG: Recent Labs  Lab 07/27/20 1735 07/27/20 1943 07/27/20 2344 07/28/20 0435 07/28/20 0737  GLUCAP 99 170* 130* 130* 152*   Lipid Profile: No results for input(s): CHOL, HDL, LDLCALC, TRIG, CHOLHDL, LDLDIRECT in the last 72 hours. Thyroid Function Tests: No results for input(s): TSH, T4TOTAL, FREET4, T3FREE, THYROIDAB in the last 72 hours. Anemia Panel: No  results for input(s): VITAMINB12, FOLATE, FERRITIN, TIBC, IRON, RETICCTPCT in the last 72 hours. Sepsis Labs: No results for input(s): PROCALCITON, LATICACIDVEN in the last 168 hours.  Recent Results (from the past 240 hour(s))  Resp Panel by RT-PCR (Flu A&B, Covid) Nasopharyngeal Swab     Status: None   Collection Time: 07/26/20  2:07 AM   Specimen: Nasopharyngeal Swab; Nasopharyngeal(NP) swabs in vial transport medium  Result Value Ref Range Status   SARS Coronavirus 2 by RT PCR NEGATIVE NEGATIVE Final  Comment: (NOTE) SARS-CoV-2 target nucleic acids are NOT DETECTED.  The SARS-CoV-2 RNA is generally detectable in upper respiratory specimens during the acute phase of infection. The lowest concentration of SARS-CoV-2 viral copies this assay can detect is 138 copies/mL. A negative result does not preclude SARS-Cov-2 infection and should not be used as the sole basis for treatment or other patient management decisions. A negative result may occur with  improper specimen collection/handling, submission of specimen other than nasopharyngeal swab, presence of viral mutation(s) within the areas targeted by this assay, and inadequate number of viral copies(<138 copies/mL). A negative result must be combined with clinical observations, patient history, and epidemiological information. The expected result is Negative.  Fact Sheet for Patients:  BloggerCourse.com  Fact Sheet for Healthcare Providers:  SeriousBroker.it  This test is no t yet approved or cleared by the [place] FDA and  has been authorized for detection and/or diagnosis of SARS-CoV-2 by FDA under an Emergency Use Authorization (EUA). This EUA will remain  in effect (meaning this test can be used) for the duration of the COVID-19 declaration under Section 564(b)(1) of the Act, 21 U.S.C.section 360bbb-3(b)(1), unless the authorization is terminated  or revoked sooner.        Influenza A by PCR NEGATIVE NEGATIVE Final   Influenza B by PCR NEGATIVE NEGATIVE Final    Comment: (NOTE) The Xpert Xpress SARS-CoV-2/FLU/RSV plus assay is intended as an aid in the diagnosis of influenza from Nasopharyngeal swab specimens and should not be used as a sole basis for treatment. Nasal washings and aspirates are unacceptable for Xpert Xpress SARS-CoV-2/FLU/RSV testing.  Fact Sheet for Patients: BloggerCourse.com  Fact Sheet for Healthcare Providers: SeriousBroker.it  This test is not yet approved or cleared by the [place] FDA and has been authorized for detection and/or diagnosis of SARS-CoV-2 by FDA under an Emergency Use Authorization (EUA). This EUA will remain in effect (meaning this test can be used) for the duration of the COVID-19 declaration under Section 564(b)(1) of the Act, 21 U.S.C. section 360bbb-3(b)(1), unless the authorization is terminated or revoked.  Performed at [facility], [address] [place], [place] [address]      RN Pressure Injury Documentation:     Estimated body mass index is 23.15 kg/m as calculated from the following:   Height as of this encounter: 5\' 11"  (1.803 m).   Weight as of this encounter: 75.3 kg.  Malnutrition Type:      Malnutrition Characteristics:      Nutrition Interventions:        Radiology Studies: CT HEAD WO CONTRAST  Result Date: 07/27/2020 CLINICAL DATA:  Mental status change EXAM: CT HEAD WITHOUT CONTRAST TECHNIQUE: Contiguous axial images were obtained from the base of the skull through the vertex without intravenous contrast. COMPARISON:  None. FINDINGS: Brain: There is no acute intracranial hemorrhage, mass effect, or edema. Gray-white differentiation is preserved. Prominence of the ventricles and sulci reflects generalized parenchymal volume loss. Patchy hypoattenuation in the supratentorial white matter is  nonspecific but probably reflects mild to moderate chronic microvascular ischemic changes. No definite extra-axial collection. Vascular: There is atherosclerotic calcification at the skull base. Skull: Calvarium is unremarkable. Sinuses/Orbits: No acute finding. Other: None. IMPRESSION: No acute intracranial abnormality. Chronic microvascular ischemic changes. Electronically Signed   By: 09/26/2020 M.D.   On: 07/27/2020 17:22   DG Abd Portable 1V  Result Date: 07/27/2020 CLINICAL DATA:  Small-bowel obstruction. EXAM: PORTABLE ABDOMEN - 1 VIEW COMPARISON:  CT 07/26/2020 FINDINGS:  Previously identified prominent small bowel distention has significantly improved. Left inguinal hernia again noted. Colon is not distended. Stool in the colon. No free air. Degenerative changes scoliosis lumbar spine. Degenerative changes both hips. Pelvic calcifications consistent phleboliths. Surgical clips noted the left groin. IMPRESSION: 1. Previously identified prominent small bowel distention has significantly improved. Findings consistent with improving small bowel obstruction. 2. Left inguinal hernia again noted. Colon is not distended. Electronically Signed   By: Maisie Fus  Register   On: 07/27/2020 15:08   Scheduled Meds: . benazepril  10 mg Oral Daily  . finasteride  5 mg Oral Daily  . latanoprost  1 drop Both Eyes QHS  . LORazepam  0.5 mg Intravenous On Call  . LORazepam  2 mg Intravenous Once  . temazepam  30 mg Oral QHS  . terazosin  1 mg Oral QHS  . timolol  1 drop Both Eyes BID   Continuous Infusions: . lacosamide (VIMPAT) IV    . magnesium sulfate bolus IVPB    . potassium PHOSPHATE IVPB (in mmol)      LOS: 2 days    The patient is critically ill with multiple organ system failure and requires high complexity decision making for assessment and support, frequent evaluation and titration of therapies and application of advanced monitoring technologies and extensive interpretation of multiple  databases.  CRITICAL CARE TIME: 37 nonconsecutive minutes devoted to patient care services described in this note including but not limited to reviewing chart, seeing patient, coordinating care, updating family and discussing the case with consultants  Merlene Laughter, DO [facility] PAGER is on AMION  If 7PM-7AM, please contact night-coverage [email or web address]

## 2020-07-29 DIAGNOSIS — F13239 Sedative, hypnotic or anxiolytic dependence with withdrawal, unspecified: Secondary | ICD-10-CM | POA: Diagnosis not present

## 2020-07-29 DIAGNOSIS — R569 Unspecified convulsions: Secondary | ICD-10-CM | POA: Diagnosis not present

## 2020-07-29 LAB — CK
Total CK: 560 U/L — ABNORMAL HIGH (ref 49–397)
Total CK: 745 U/L — ABNORMAL HIGH (ref 49–397)
Total CK: 623 U/L — ABNORMAL HIGH (ref 49–397)

## 2020-07-29 LAB — CBC WITH DIFFERENTIAL/PLATELET
Abs Immature Granulocytes: 0.02 10*3/uL (ref 0.00–0.07)
Basophils Absolute: 0 10*3/uL (ref 0.0–0.1)
Basophils Relative: 1 %
Eosinophils Absolute: 0.1 10*3/uL (ref 0.0–0.5)
Eosinophils Relative: 1 %
HCT: 31.6 % — ABNORMAL LOW (ref 39.0–52.0)
Hemoglobin: 11.1 g/dL — ABNORMAL LOW (ref 13.0–17.0)
Immature Granulocytes: 0 %
Lymphocytes Relative: 22 %
Lymphs Abs: 1.9 10*3/uL (ref 0.7–4.0)
MCH: 32.6 pg (ref 26.0–34.0)
MCHC: 35.1 g/dL (ref 30.0–36.0)
MCV: 92.9 fL (ref 80.0–100.0)
Monocytes Absolute: 0.7 10*3/uL (ref 0.1–1.0)
Monocytes Relative: 8 %
Neutro Abs: 5.7 10*3/uL (ref 1.7–7.7)
Neutrophils Relative %: 68 %
Platelets: 231 10*3/uL (ref 150–400)
RBC: 3.4 MIL/uL — ABNORMAL LOW (ref 4.22–5.81)
RDW: 12.7 % (ref 11.5–15.5)
WBC: 8.4 10*3/uL (ref 4.0–10.5)
nRBC: 0 % (ref 0.0–0.2)

## 2020-07-29 LAB — MISC LABCORP TEST (SEND OUT): Labcorp test code: 54444

## 2020-07-29 LAB — COMPREHENSIVE METABOLIC PANEL
ALT: 17 U/L (ref 0–44)
AST: 30 U/L (ref 15–41)
Albumin: 3.1 g/dL — ABNORMAL LOW (ref 3.5–5.0)
Alkaline Phosphatase: 51 U/L (ref 38–126)
Anion gap: 8 (ref 5–15)
BUN: 5 mg/dL — ABNORMAL LOW (ref 8–23)
CO2: 25 mmol/L (ref 22–32)
Calcium: 8.8 mg/dL — ABNORMAL LOW (ref 8.9–10.3)
Chloride: 102 mmol/L (ref 98–111)
Creatinine, Ser: 0.79 mg/dL (ref 0.61–1.24)
GFR, Estimated: >60 mL/min (ref >60)
Glucose, Bld: 127 mg/dL — ABNORMAL HIGH (ref 70–99)
Potassium: 3.6 mmol/L (ref 3.5–5.1)
Sodium: 135 mmol/L (ref 135–145)
Total Bilirubin: 0.6 mg/dL (ref 0.3–1.2)
Total Protein: 6 g/dL — ABNORMAL LOW (ref 6.5–8.1)

## 2020-07-29 LAB — VITAMIN B12: Vitamin B-12: 245 pg/mL (ref 180–914)

## 2020-07-29 LAB — GLUCOSE, CAPILLARY
Glucose-Capillary: 124 mg/dL — ABNORMAL HIGH (ref 70–99)
Glucose-Capillary: 107 mg/dL — ABNORMAL HIGH (ref 70–99)
Glucose-Capillary: 105 mg/dL — ABNORMAL HIGH (ref 70–99)
Glucose-Capillary: 130 mg/dL — ABNORMAL HIGH (ref 70–99)
Glucose-Capillary: 204 mg/dL — ABNORMAL HIGH (ref 70–99)

## 2020-07-29 LAB — AMMONIA: Ammonia: 15 umol/L (ref 9–35)

## 2020-07-29 MED ORDER — VITAMIN B-12 1000 MCG PO TABS
1000.0000 ug | ORAL_TABLET | Freq: Every day | ORAL | Status: DC
  Administered 2020-07-30 – 2020-08-03 (×5): 1000 ug via ORAL
  Filled 2020-07-29 (×5): qty 1

## 2020-07-29 NOTE — Progress Notes (Signed)
Physical Therapy Treatment Patient Details Name: Russell Ayers MRN: [id number] DOB: [birth year] Today's Date: 07/29/2020    History of Present Illness Patient is an 87 y.o. male who presented to [facility] for abdominal pain found to have SBO. 07/28/20 pt had several seizures with EEG suggestive of moderate diffuse encephalopathy  and stated that the excessive beta activity seen in the background is most likely due to the effect of benzodiazepine and is a benign EEG pattern. PMH significant for DM, anxiety, HTN, inguinal hernia, and insomnia.    PT Comments    Pt lethargic, falling asleep as soon as he would return to supine, likely due to him having seizures 3/9. Due to pt being hooked up to EEG he was limited in advancing his mobility, thus performed gait training at EOB taking steps laterally and anterior <> posterior with UE support on therapist. Pt displays impaired coordination, balance, and strength that places him at risk for falls. He displays poor bil feet clearance and an anterior-posterior trunk sway, resulting in him needing min-modA to ambulate this date. Pt demonstrated improved technique with sit to stand transfers with subsequent reps, progressing from needing modA to power up to stand to only needing minA by the end of the session. Will continue to follow acutely. Current recommendations remain appropriate.    Follow Up Recommendations  SNF     Equipment Recommendations  Rolling walker with 5" wheels    Recommendations for Other Services       Precautions / Restrictions Precautions Precautions: Fall Precaution Comments: monitor vitals; EEG; seizures; soft waist belt restraint; mittens Restrictions Weight Bearing Restrictions: No    Mobility  Bed Mobility Overal bed mobility: Needs Assistance Bed Mobility: Supine to Sit;Sit to Supine     Supine to sit: Min assist;HOB elevated Sit to supine: Min assist   General bed mobility comments: Extra time and effort with pt  initiating movement but needing minA to manage trunk safely to ascend to sit EOB with HOB elevated. MinA to manage legs back to supine.    Transfers Overall transfer level: Needs assistance Equipment used: Rolling walker (2 wheeled) Transfers: Sit to/from Stand Sit to Stand: Mod assist;Min assist         General transfer comment: ModA to power up to come to stand from EOB first time but pt progressed to minA with subsequent reps (x8 reps total) as he displayed improved anterior momentum.  Ambulation/Gait Ambulation/Gait assistance: Min assist Gait Distance (Feet): 30 Feet (x4 bouts of ~30 ft > ~15 ft > ~12 ft > ~3 ft) Assistive device: 1 person hand held assist Gait Pattern/deviations: Step-through pattern;Shuffle;Trunk flexed Gait velocity: reduced Gait velocity interpretation: <1.31 ft/sec, indicative of household ambulator General Gait Details: Pt limited in mobility distance progression by being hooked up to EEG, thus performed x4 gait bouts in which pt started by taking steps laterally L and R at EOB then progressed to anterior <> posterior steps with UE support on therapist. Pt displaying poor bil foot clearance, shuffling feet despite cues to correct. Increased anterior-posterior trunk sway and difficulty primarily with anterior <> posterior steps, needing min-modA for stability.   Stairs             Wheelchair Mobility    Modified Rankin (Stroke Patients Only)       Balance Overall balance assessment: Needs assistance Sitting-balance support: Feet supported;Bilateral upper extremity supported Sitting balance-Leahy Scale: Poor Sitting balance - Comments: UE support static sitting EOB with min guard for safety.  Standing balance support: During functional activity;Bilateral upper extremity supported Standing balance-Leahy Scale: Poor Standing balance comment: Reliant on UE support and external assist for stability.                             Cognition Arousal/Alertness: Awake/alert Behavior During Therapy: Flat affect Overall Cognitive Status: Impaired/Different from baseline Area of Impairment: Attention;Memory;Following commands;Safety/judgement;Awareness;Problem solving                   Current Attention Level: Sustained Memory: Decreased short-term memory Following Commands: Follows one step commands consistently;Follows one step commands with increased time Safety/Judgement: Decreased awareness of safety;Decreased awareness of deficits Awareness: Emergent Problem Solving: Decreased initiation;Difficulty sequencing;Requires verbal cues;Requires tactile cues;Slow processing General Comments: Pt requiring repeated cues and increased time for processing commands throughout session. Pt lethargic with flat affect during, falling asleep almost immediately upon returning to supine.      Exercises Other Exercises Other Exercises: Sit to stand 5x in a row from EOB with use of UEs    General Comments        Pertinent Vitals/Pain Pain Assessment: Faces Faces Pain Scale: Hurts a little bit Pain Location: generalized with mobility Pain Descriptors / Indicators: Discomfort;Sore;Grimacing Pain Intervention(s): Limited activity within patient's tolerance;Monitored during session;Repositioned    Home Living                      Prior Function            PT Goals (current goals can now be found in the care plan section) Acute Rehab PT Goals Patient Stated Goal: agreeable to session, none stated PT Goal Formulation: With patient Time For Goal Achievement: 08/02/20 Potential to Achieve Goals: Good Progress towards PT goals: Progressing toward goals (limited by recent seizure)    Frequency    Min 3X/week      PT Plan Current plan remains appropriate    Co-evaluation              AM-PAC PT "6 Clicks" Mobility   Outcome Measure  Help needed turning from your back to your side while in a  flat bed without using bedrails?: A Little Help needed moving from lying on your back to sitting on the side of a flat bed without using bedrails?: A Little Help needed moving to and from a bed to a chair (including a wheelchair)?: A Lot Help needed standing up from a chair using your arms (e.g., wheelchair or bedside chair)?: A Little Help needed to walk in hospital room?: A Lot Help needed climbing 3-5 steps with a railing? : A Lot 6 Click Score: 15    End of Session Equipment Utilized During Treatment: Gait belt Activity Tolerance: Patient tolerated treatment well Patient left: with call bell/phone within reach;in bed;with bed alarm set Nurse Communication: Mobility status PT Visit Diagnosis: Unsteadiness on feet (R26.81);Muscle weakness (generalized) (M62.81);Pain;Other abnormalities of gait and mobility (R26.89);Difficulty in walking, not elsewhere classified (R26.2) Pain - Right/Left: Left Pain - part of body:  (abdomen)     Time: 2703-5009 PT Time Calculation (min) (ACUTE ONLY): 24 min  Charges:  $Gait Training: 8-22 mins $Therapeutic Activity: 8-22 mins                     Raymond Gurney, PT, DPT Acute Rehabilitation Services  Pager: [phone] Office: [phone]    Jewel Baize 07/29/2020, 9:25 AM

## 2020-07-29 NOTE — Progress Notes (Addendum)
NEUROLOGY PROGRESS NOTE  Subjective: He did not have any further seizures overnight per nursing. Physical examination has improved in comparison to yesterday; still drowsy but more alert and oriented.   Physical Exam  Constitutional: Appears well-developed and well-nourished.  Cardiovascular: Normal rate and regular rhythm.  Respiratory: Effort normal, non-labored breathing  Neuro:  Mental Status: Drowsy but rouses to voice. Oriented to person, place, year and situation. Aware that he originally presented with abdominal pain and recalls being told that he had seizures. Follows command to show two fingers, gives thumbs up and make fist/open up hand.  Speech: Fluent with repetition and naming intact.  Cranial Nerves: L pupil 23mm, R pupil 24mm ( right with coloboma) Pupils are reactive to light. EOMI, UTA VF due to patient cooperation, face w/decreased right nasolabial fold, tongue midline w/no atrophy or fasciculations  Motor: Tone/bulk are normal. Antigravity throughout with coaching.   Sensory: Intact to light touch throughout  Deep Tendon Reflexes: 3+ and symmetric throughout Cerebellar: Deferred  Gait: Deferred   Pertinent Imaging/Labs:  07/27/20 CT Head WO Contrast No acute intracranial abnormality. Chronic microvascular ischemic changes.  07/27/20 MR Brain W WO Contrast  1. No acute intracranial abnormality. 2. Moderate atrophy and white matter disease likely reflects the sequela of chronic microvascular ischemia.  Labwork: 07/29/20 Vitamin B12 low normal at 245 07/29/20 Ammonia 15  CK elevated at 560, from 324 over a 12 hour period  Creatinine stable  CSF studies: RBC 39/2, WBC 0/0, glucose 85, 30 Gram stain and culture negative to date VZV IgG and HSV PCR pending   Assessment/Plan:  Russell Ayers is an 87 year old male w/pmh of diabetes, hypertension, generalized anxiety, glaucoma and insomnia who originally presented to OSH with abdominal pain found to have a small bowel  obstruction. While being worked up there he developed seizures for which neurology was consulted. His generalized tonic clonic seizures are thought to be in the setting of benzodiazepine withdrawal; he has longstanding insomnia and anxiety for which he takes Ativan 2 mg BID + Temazepam 30 mg HS.   He was initially loaded with Vimpat and Keppra and after his home benzodiazepines were resumed. To rule out infection/inflammation that also could have played a role in his seizures an LP was done on 07/28/20. MRI Brain was also obtained to rule out structural abnormalities that could have predisposed him to having a seizure.   MRI Brain was negative for structural lesions. LP CSF Cell Count w/WBC 0, RBC 2, Glucose 85, Protein 30. CSF gram stain w/WBC present predominantly mononuclear, no organisms seen, culture with no growth to date. So far LP results are bland, are awaiting CSF HSV 1/2 Ab IgG/IgM.   He was placed on long term EEG monitoring that was discontinued today- pertinent for excessive beta activity likely due to benzodiazepine use, negative for seizures.   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 Per discussion with primary team they are hopeful he will pass his swallow evaluation today. If he does not pass and requires an NGT then he will be able to receive the Temazepam VT. - Discontinue  long-term monitoring - Trend CK to peak given risk of rhabdomyolysis post status epilepticus - No indication for long-term AEDs at this time - Neurology will continue to follow until further  normalization of patient's exam  # Low normal B12 - Methylmalonic acid to assess for functional vitamin B12 deficiency - Empiric 1000 mcg vitamin B12 daily while awaiting methylmalonic acid  Patient seen and discussed with attending physician Dr. Joretta Bachelor Heard,NP  [facility] 07/29/2020, 8:06 AM    Brooke Dare MD-PhD [facility] [phone]  Total critical care time: 30 minutes, independent of nurse practitioner   Critical care time was exclusive of separately billable procedures and treating other patients.   Critical care was necessary to treat or prevent imminent or life-threatening deterioration.   Critical care was time spent personally by me on the following activities: development of treatment plan with patient and/or surrogate as well as nursing, discussions with consultants, evaluation of patient's response to treatment, examination of patient, obtaining history from patient or surrogate, ordering and performing treatments and interventions, ordering and review of laboratory studies, ordering and review of radiographic studies, pulse oximetry and re-evaluation of patient's condition.

## 2020-07-29 NOTE — Progress Notes (Signed)
Progress Note     Subjective: Patient groggy but alert and following commands. He denies abdominal pain or nausea. He denies groin pain or swelling. He is unsure whether he is passing flatus. No BM recorded since 3/7 but has not been able to take in any PO either.   Objective: Vital signs in last 24 hours: Temp:  [97.5 F (36.4 C)-99 F (37.2 C)] 97.7 F (36.5 C) (03/10 0800) Pulse Rate:  [73-107] 75 (03/10 1000) Resp:  [15-28] 19 (03/10 1000) BP: (118-183)/(55-122) 151/70 (03/10 0800) SpO2:  [94 %-100 %] 98 % (03/10 1000) Last BM Date: 07/26/20  Intake/Output from previous day: 03/09 0701 - 03/10 0700 In: 1766.3 [IV Piggyback:1766.3] Out: 1560 [Urine:760] Intake/Output this shift: No intake/output data recorded.  PE: Gen:  Alert, NAD, pleasant Pulm:  Normal rate and effort  Abd: Soft, ND, completely NT on light and deep palpation, normoactive BS, left inguinal hernia reducible and without skin changes.   Psych: A&Ox3  Skin: no rashes noted, warm and dry   Lab Results:  Recent Labs    07/28/20 1318 07/29/20 0234  WBC 7.6 8.4  HGB 11.6* 11.1*  HCT 33.0* 31.6*  PLT 241 231   BMET Recent Labs    07/28/20 1318 07/29/20 0234  NA 134* 135  K 3.6 3.6  CL 101 102  CO2 24 25  GLUCOSE 127* 127*  BUN 6* 5*  CREATININE 0.82 0.79  CALCIUM 9.0 8.8*   PT/INR Recent Labs    07/28/20 1318  LABPROT 14.1  INR 1.1   CMP     Component Value Date/Time   NA 135 07/29/2020 0234   NA 135 02/19/2019 1113   K 3.6 07/29/2020 0234   CL 102 07/29/2020 0234   CO2 25 07/29/2020 0234   GLUCOSE 127 (H) 07/29/2020 0234   BUN 5 (L) 07/29/2020 0234   BUN 9 02/19/2019 1113   CREATININE 0.79 07/29/2020 0234   CALCIUM 8.8 (L) 07/29/2020 0234   PROT 6.0 (L) 07/29/2020 0234   PROT 6.7 02/19/2019 1113   ALBUMIN 3.1 (L) 07/29/2020 0234   ALBUMIN 4.1 02/19/2019 1113   AST 30 07/29/2020 0234   ALT 17 07/29/2020 0234   ALKPHOS 51 07/29/2020 0234   BILITOT 0.6 07/29/2020 0234    BILITOT 0.3 02/19/2019 1113   GFRNONAA >60 07/29/2020 0234   GFRAA 83 02/19/2019 1113   Lipase     Component Value Date/Time   LIPASE 28 07/25/2020 2126       Studies/Results: CT HEAD WO CONTRAST  Result Date: 07/27/2020 CLINICAL DATA:  Mental status change EXAM: CT HEAD WITHOUT CONTRAST TECHNIQUE: Contiguous axial images were obtained from the base of the skull through the vertex without intravenous contrast. COMPARISON:  None. FINDINGS: Brain: There is no acute intracranial hemorrhage, mass effect, or edema. Gray-white differentiation is preserved. Prominence of the ventricles and sulci reflects generalized parenchymal volume loss. Patchy hypoattenuation in the supratentorial white matter is nonspecific but probably reflects mild to moderate chronic microvascular ischemic changes. No definite extra-axial collection. Vascular: There is atherosclerotic calcification at the skull base. Skull: Calvarium is unremarkable. Sinuses/Orbits: No acute finding. Other: None. IMPRESSION: No acute intracranial abnormality. Chronic microvascular ischemic changes. Electronically Signed   By: Guadlupe Spanish M.D.   On: 07/27/2020 17:22   MR BRAIN W WO CONTRAST  Result Date: 07/28/2020 CLINICAL DATA:  Seizure.  Mental status change. EXAM: MRI HEAD WITHOUT AND WITH CONTRAST TECHNIQUE: Multiplanar, multiecho pulse sequences of the brain and surrounding structures  were obtained without and with intravenous contrast. CONTRAST:  39mL GADAVIST GADOBUTROL 1 MMOL/ML IV SOLN COMPARISON:  CT head without contrast 07/27/2020 FINDINGS: Brain: Metallic artifact from the oral cavity distorts some imaging of the inferior frontal lobes. Portions of the diffusion and susceptibility images are obscured. No acute infarct, hemorrhage, or mass lesion is present. Moderate atrophy and white matter changes are present bilaterally. The ventricles are proportionate to the degree of atrophy. The basal ganglia are intact. No significant  extraaxial fluid collection is present. Insert normal IAC The brainstem and cerebellum are within normal limits. Postcontrast images demonstrate no pathologic enhancement. Vascular: Flow is present in the major intracranial arteries. Skull and upper cervical spine: Exaggerated cervical lordosis is present. Craniocervical junction is normal. Sinuses/Orbits: The paranasal sinuses and mastoid air cells are clear. Bilateral lens replacements are noted. Globes and orbits are otherwise unremarkable. IMPRESSION: 1. No acute intracranial abnormality. 2. Moderate atrophy and white matter disease likely reflects the sequela of chronic microvascular ischemia. Electronically Signed   By: Marin Roberts M.D.   On: 07/28/2020 11:29   DG Abd Portable 1V  Result Date: 07/27/2020 CLINICAL DATA:  Small-bowel obstruction. EXAM: PORTABLE ABDOMEN - 1 VIEW COMPARISON:  CT 07/26/2020 FINDINGS: Previously identified prominent small bowel distention has significantly improved. Left inguinal hernia again noted. Colon is not distended. Stool in the colon. No free air. Degenerative changes scoliosis lumbar spine. Degenerative changes both hips. Pelvic calcifications consistent phleboliths. Surgical clips noted the left groin. IMPRESSION: 1. Previously identified prominent small bowel distention has significantly improved. Findings consistent with improving small bowel obstruction. 2. Left inguinal hernia again noted. Colon is not distended. Electronically Signed   By: Maisie Fus  Register   On: 07/27/2020 15:08   EEG adult  Result Date: 07/28/2020 Charlsie Quest, MD     07/29/2020  9:59 AM Patient Name: Jaxsyn R Angola MRN: [id number] Epilepsy Attending: Charlsie Quest Referring Physician/Provider: Dr Haig Prophet Date: 07/28/2020 Duration: 23.17 mins Patient history: 87 year old male with seizure-like episodes.  EEG to evaluate for seizure Level of alertness: Awake AEDs during EEG study: Ativan Technical aspects: This EEG study  was done with scalp electrodes positioned according to the 10-20 International system of electrode placement. Electrical activity was acquired at a sampling rate of 500Hz  and reviewed with a high frequency filter of 70Hz  and a low frequency filter of 1Hz . EEG data were recorded continuously and digitally stored. Description:  EEG showed continuous generalized 3 to 6 Hz theta-delta slowing with overriding 15 to 18 Hz beta activity distributed symmetrically and diffusely. Hyperventilation and photic stimulation were not performed.   ABNORMALITY -Excessive beta, generalized -Continuous slow, generalized IMPRESSION: This study is suggestive of moderate diffuse encephalopathy, nonspecific etiology. No seizures or epileptiform discharges were seen throughout the recording. The excessive beta activity seen in the background is most likely due to the effect of benzodiazepine and is a benign EEG pattern. Priyanka    Anti-infectives: Anti-infectives (From admission, onward)   None       Assessment/Plan DM HTN Hiatal hernia Seizure like activity - Noted AM 3/9. MRI brain negative for intra-cranial abnormalities. Neurology following. Given IV Keppra. Transferred to [facility] at [facility] and currently in ICU  Left inguinal hernia - This is reducible on exam. Left inguinal hernia with a loop of sigmoid colon within although no obstructive changes are noted on CT. No indication for any surgery at this time and this does not appear to be the site of patients psbo. Can  follow up outpatient unless new symptoms develop.   pSBO - CT 3/7 Multiple dilated loops of small bowel with a transition zone in the left lower quadrant likely secondary to inflammatory changes in the distal ileum.  - Per chart review patient was having BM's and tolerating PO's (was on full liquids and took in 2040cc/24 hours documented) prior to seizure like activity. His xray yesterday showed improving small bowel distension. He currently is  soft and NT on exam with normoactive BS. No current indication for emergency surgery. His diet could be advanced from our standpoint but in the setting of recent seizure like activity, will likely need speech eval prior to being restarted on a diet. Defer this to medicine.   No other recommendations from a surgical standpoint, we will sign off at this time. Please call if any concerns or we can be of further assistance.   LOS: 3 days    Juliet Rude , [facility] 07/29/2020, 10:11 AM Please see Amion for pager number during day hours 7:00am-4:30pm

## 2020-07-29 NOTE — Progress Notes (Signed)
NAME:  Russell Ayers, MRN:  [id number], DOB:  01-08-1933, LOS: 3 ADMISSION DATE:  07/25/2020, CONSULTATION DATE: 07/28/20 REFERRING MD:  Dr. Marland Mcalpine, CHIEF COMPLAINT:  AMS, Seizure    Brief History:  87 y/o M who presented to [facility] on 3/6 with nausea and lower abdominal pain. Work up consistent with SBO.  Which improved, he was ready to be discharged to SNF then he started having seizures likely from benzodiazepine withdrawal.  EEG remain negative for status or any more seizures  Past Medical History:  DM II HTN Constipation  Inguinal Hernia Glaucoma Anxiety  Insomnia   Significant Hospital Events:  3/06 Admit with abdominal pain in the setting of SBO  Consults:  Neurology General surgery PCCM  Procedures:    Significant Diagnostic Tests:   CT ABD/Pelvis w Contrast 3/7 >> multiple dilated loops of small bowel with a transition zone in the LLQ likely secondary to inflammatory changes in the distal ileum, these changes are consistent with a partial small bowel obstruction, diverticulosis without diverticulitis, large hiatal hernia, left inguinal  Hernia with a loop of sigmoid colon within although no obstructive changes noted   CT Head 3/8 >> no acute abnormality, chronic microvascular ischemic changes   EEG 3/9 >> negative for acute seizures MRI brain 3/9  >> 1. No acute intracranial abnormality.Moderate atrophy and white matter disease likely reflects the sequela of chronic microvascular ischemia.  Micro Data:  COVID 3/7 >> negative  Influenza A/B 3/7 >> negative   Antimicrobials:    Interim History / Subjective:  No more seizures patient remains lethargic but oriented.  EEG showed no more seizures  Objective   Blood pressure (!) 151/70, pulse 75, temperature 97.7 F (36.5 C), temperature source Axillary, resp. rate 19, height 5\' 11"  (1.803 m), weight 75.3 kg, SpO2 98 %.        Intake/Output Summary (Last 24 hours) at 07/29/2020 1039 Last data filed at 07/29/2020  0600 Gross per 24 hour  Intake 1766.34 ml  Output 760 ml  Net 1006.34 ml   Filed Weights   07/25/20 2113  Weight: 75.3 kg    Examination:   Physical exam: General: Chronically ill-appearing male, lying on the bed, lethargic HEENT: [place]/AT, eyes anicteric.  Severely daily dry mucous membrane, no JVD Neuro: Lethargic but opens eyes with vocal stimuli, following commands, moving all 4 extremities spontaneously Chest: Coarse breath sounds, no wheezes or rhonchi Heart: Regular rate and rhythm, no murmurs or gallops Abdomen: Soft, nontender, nondistended, bowel sounds present Skin: No rash, angular cheilosis noted  Resolved Hospital Problem list   SBO  Assessment & Plan:   Seizures likely due to benzodiazepine withdrawal New onset seizure 3/9, note patient takes 2mg  BID of ativan at home, was on PRN dosing 0.5 and received it 1x since admission. EEG remain negative for more seizures Appreciate neurology input LP was negative for acute meningitis or encephalitis Restarted back on home dose benzodiazepine MRI brain is negative for acute findings  Acute Metabolic Encephalopathy  s/p seizure, postictal state with ativan administration. CT head negative  Mental status slightly better today Monitor electrolytes Continue neurochecks every 1 hour  Left Inguinal Hernia  Reproducible on prior exam notes by [facility] -defer follow up to [facility]   Hx HTN  -hold home benazepril, HCTZ -follow BP trend   Hyponatremia  Hypokalemia  Hypophosphatemia  Monitor and replace electrolytes  Normocytic Anemia  No hx colonoscopy per prior notes.  -trend CBC -follow up anemia panel (ordered per [facility]) -monitor  for bleeding   Hx BPH  -finasteride, terazosin when able to take PO's  Glaucoma  -timolol, latanoprost QHS   Best practice (evaluated daily)  Diet: NPO, speech and swallow evaluation Pain/Anxiety/Delirium protocol (if indicated): n/a  VAP protocol (if indicated): n/a  DVT prophylaxis:  SCD GI prophylaxis: N/A Glucose control: N/A Mobility: PT/OT evaluation Disposition: ICU  Goals of Care:  Last date of multidisciplinary goals of care discussion: Pending Family and staff present:  Summary of discussion:  Follow up goals of care discussion due:  Code Status: Full code  Labs   CBC: Recent Labs  Lab 07/25/20 2126 07/26/20 0454 07/27/20 0510 07/28/20 0437 07/28/20 1318 07/29/20 0234  WBC 10.6* 10.4 7.1 6.9 7.6 8.4  NEUTROABS 8.0*  --   --   --  5.6 5.7  HGB 13.0 12.0* 11.6* 10.9* 11.6* 11.1*  HCT 38.2* 35.8* 35.0* 33.1* 33.0* 31.6*  MCV 93.9 95.0 96.2 97.1 93.2 92.9  PLT 277 238 230 221 241 231    Basic Metabolic Panel: Recent Labs  Lab 07/26/20 0454 07/27/20 0510 07/28/20 0437 07/28/20 1318 07/29/20 0234  NA 132* 134* 133* 134* 135  K 3.4* 3.7 3.6 3.6 3.6  CL 101 104 104 101 102  CO2 22 21* 21* 24 25  GLUCOSE 141* 136* 131* 127* 127*  BUN 13 7* 7* 6* 5*  CREATININE 0.76 0.65 0.69 0.82 0.79  CALCIUM 8.5* 8.8* 8.7* 9.0 8.8*  MG  --  1.9 1.8 2.0  --   PHOS  --   --  1.9* 2.4*  --    GFR: Estimated Creatinine Clearance: 70.6 mL/min (by C-G formula based on SCr of 0.79 mg/dL). Recent Labs  Lab 07/27/20 0510 07/28/20 0437 07/28/20 1318 07/28/20 1621 07/29/20 0234  WBC 7.1 6.9 7.6  --  8.4  LATICACIDVEN  --   --  2.1* 2.1*  --     Liver Function Tests: Recent Labs  Lab 07/25/20 2126 07/26/20 0454 07/28/20 1318 07/29/20 0234  AST 23 18 26 30   ALT 19 17 16 17   ALKPHOS 68 60 62 51  BILITOT 0.9 0.8 0.6 0.6  PROT 7.3 6.3* 6.2* 6.0*  ALBUMIN 4.1 3.5 3.3* 3.1*   Recent Labs  Lab 07/25/20 2126  LIPASE 28   Recent Labs  Lab 07/29/20 0234  AMMONIA 15    ABG No results found for: PHART, PCO2ART, PO2ART, HCO3, TCO2, ACIDBASEDEF, O2SAT   Coagulation Profile: Recent Labs  Lab 07/28/20 1318  INR 1.1    Cardiac Enzymes: Recent Labs  Lab 07/28/20 1318 07/29/20 0234  CKTOTAL 324 560*    HbA1C: No results found for:  HGBA1C  CBG: Recent Labs  Lab 07/28/20 1620 07/28/20 1940 07/28/20 2321 07/29/20 0353 07/29/20 0800  GLUCAP 105* 126* 147* 124* 107*    Review of Systems:   Unable to complete as patient is post-ictal.   Past Medical History:  He,  has a past medical history of Diabetes mellitus without complication (HCC), Generalized anxiety disorder, Glaucoma, Hypertension, Inguinal hernia, Insomnia, and Prostatism.   Surgical History:   Past Surgical History:  Procedure Laterality Date  . CATARACT EXTRACTION, BILATERAL    . glaucoma       Social History:   reports that he quit smoking about 31 years ago. He has never used smokeless tobacco. He reports that he does not drink alcohol and does not use drugs.   Family History:  His family history includes Cancer in his brother; Heart attack in his brother.  Allergies No Known Allergies   Home Medications  Prior to Admission medications   Medication Sig Start Date End Date Taking? Authorizing Provider  aspirin EC 81 MG tablet Take 162 mg by mouth every 4 (four) hours as needed for mild pain. Swallow whole.   Yes [provider]  benazepril (LOTENSIN) 10 MG tablet Take 10 mg by mouth daily. 10/20/19  Yes [provider]  cetirizine (ZYRTEC) 10 MG chewable tablet Chew 10-20 mg by mouth See admin instructions. 20mg  in the morning  10mg  at noon 10mg  in the afternoon   Yes [provider]  diphenhydrAMINE (BENADRYL) 25 MG tablet Take 25 mg by mouth at bedtime.   Yes [provider]  finasteride (PROSCAR) 5 MG tablet Take 5 mg by mouth daily. 09/30/18  Yes [provider]  fluticasone (FLONASE) 50 MCG/ACT nasal spray Place 1-2 sprays into both nostrils daily as needed for allergies.   Yes [provider]  hydrochlorothiazide (HYDRODIURIL) 12.5 MG tablet Take 12.5 mg by mouth daily. 09/21/18  Yes [provider]  latanoprost (XALATAN) 0.005 % ophthalmic solution Place 1 drop into both  eyes at bedtime. 11/20/18  Yes [provider]  LORazepam (ATIVAN) 2 MG tablet Take 2 mg by mouth 2 (two) times daily.  11/19/18  Yes [provider]  Melatonin 10 MG TABS Take 20-40 mg by mouth See admin instructions. 20mg  every night And 20mg  more if needed for sleep   Yes [provider]  PSYLLIUM HUSK PO Take 1 tablet by mouth 3 (three) times daily.   Yes [provider]  Saw Palmetto, Serenoa repens, (SAW PALMETTO PO) Take 1 tablet by mouth daily.   Yes [provider]  temazepam (RESTORIL) 30 MG capsule Take 30 mg by mouth at bedtime. 11/19/18  Yes [provider]  terazosin (HYTRIN) 1 MG capsule Take 1 mg by mouth at bedtime. 09/30/18  Yes [provider]  timolol (TIMOPTIC) 0.5 % ophthalmic solution Place 1 drop into both eyes daily. 09/20/18  Yes [provider]     Total critical care time: 42 minutes  Performed by:   Critical care time was exclusive of separately billable procedures and treating other patients.   Critical care was necessary to treat or prevent imminent or life-threatening deterioration.   Critical care was time spent personally by me on the following activities: development of treatment plan with patient and/or surrogate as well as nursing, discussions with consultants, evaluation of patient's response to treatment, examination of patient, obtaining history from patient or surrogate, ordering and performing treatments and interventions, ordering and review of laboratory studies, ordering and review of radiographic studies, pulse oximetry and re-evaluation of patient's condition.   MD [facility] See Amion for pager If no response to pager, please call [phone] until 7pm After 7pm, Please call E-link [phone]

## 2020-07-29 NOTE — Procedures (Addendum)
Patient Name: Russell Ayers  MRN: [id number]  Epilepsy Attending: Charlsie Quest  Referring Physician/Provider: Dr  Brooke Dare Duration: 07/28/2020 1442 to 07/29/2020 1134  Patient history: 87 year old male with seizure-like episodes.  EEG to evaluate for seizure  Level of alertness: Awake, asleep  AEDs during EEG study: Ativan  Technical aspects: This EEG study was done with scalp electrodes positioned according to the 10-20 International system of electrode placement. Electrical activity was acquired at a sampling rate of 500Hz  and reviewed with a high frequency filter of 70Hz  and a low frequency filter of 1Hz . EEG data were recorded continuously and digitally stored.   Description: No clear posterior dominant rhythm was seen.  Sleep was characterized by sleep spindles (12 to 14 Hz), maximal frontocentral region.  EEG also showed 15 to 18 Hz beta activity distributed symmetrically and diffusely. Hyperventilation and photic stimulation were not performed.     ABNORMALITY -Excessive beta, generalized  IMPRESSION: This study is within normal limits.  No seizures or epileptiform discharges were seen throughout the recording. The excessive beta activity seen in the background is most likely due to the effect of benzodiazepine and is a benign EEG pattern.   Russell Ayers 

## 2020-07-29 NOTE — Evaluation (Signed)
Clinical/Bedside Swallow Evaluation Patient Details  Name: Russell Ayers MRN: [id number] Date of Birth: [birth year]  Today's Date: 07/29/2020 Time: SLP Start Time (ACUTE ONLY): 1003 SLP Stop Time (ACUTE ONLY): 1020 SLP Time Calculation (min) (ACUTE ONLY): 17 min  Past Medical History:  Past Medical History:  Diagnosis Date  . Diabetes mellitus without complication (HCC)   . Generalized anxiety disorder   . Glaucoma   . Hypertension   . Inguinal hernia   . Insomnia   . Prostatism    Past Surgical History:  Past Surgical History:  Procedure Laterality Date  . CATARACT EXTRACTION, BILATERAL    . glaucoma     HPI:  87 y.o. male who presented to [facility] for abdominal pain found to have SBO. 07/28/20 pt had several seizures with EEG suggestive of moderate diffuse encephalopathy  and stated that the excessive beta activity seen in the background is most likely due to the effect of benzodiazepine and is a benign EEG pattern. PMH significant for DM, anxiety, HTN, inguinal hernia, and insomnia.   Assessment / Plan / Recommendation Clinical Impression  Pt presents with a mild oral dysphagia. Pt drowsy upon SLP arrival, able to arouse however required consistent cueing to maintain adequate alertness. Performed diligent oral care as dried palatal secretions noted. Pt with upper and lower partials at baseline. Pt with delayed AP transit across consistencies. Pt with inadequate mastication of small piece of solid PO. Following minimal chewing and oral holding, unmasticated piece of cracker extracted from oral cavity. No overt s/sx of aspiration with any PO. Recommend dysphagia 1 (puree) and thin liquids with meds whole in puree. Hold POs if mentation is poor. SLP to follow up. SLP Visit Diagnosis: Dysphagia, oral phase (R13.11);Dysphagia, unspecified (R13.10)    Aspiration Risk  Mild aspiration risk    Diet Recommendation   Dysphagia 1 (puree) thin liquids  Medication Administration: Whole meds with  puree    Other  Recommendations Oral Care Recommendations: Oral care BID   Follow up Recommendations 24 hour supervision/assistance      Frequency and Duration min 1 x/week  1 week       Prognosis Prognosis for Safe Diet Advancement: Good Barriers to Reach Goals: Cognitive deficits      Swallow Study   General Date of Onset: 07/25/20 HPI: 87 y.o. male who presented to [facility] for abdominal pain found to have SBO. 07/28/20 pt had several seizures with EEG suggestive of moderate diffuse encephalopathy  and stated that the excessive beta activity seen in the background is most likely due to the effect of benzodiazepine and is a benign EEG pattern. PMH significant for DM, anxiety, HTN, inguinal hernia, and insomnia. Type of Study: Bedside Swallow Evaluation Previous Swallow Assessment: none on file Diet Prior to this Study: NPO Temperature Spikes Noted: No Respiratory Status: Room air History of Recent Intubation: No Behavior/Cognition: Requires cueing;Lethargic/Drowsy;Cooperative Oral Cavity Assessment: Dry;Dried secretions Oral Care Completed by SLP: Yes Oral Cavity - Dentition: Missing dentition;Other (Comment) (upper and lower partials) Vision: Impaired for self-feeding Patient Positioning: Upright in bed Baseline Vocal Quality: Low vocal intensity Volitional Cough: Cognitively unable to elicit Volitional Swallow: Unable to elicit    Oral/Motor/Sensory Function Overall Oral Motor/Sensory Function: Generalized oral weakness   Ice Chips Ice chips: Impaired Presentation: Spoon Oral Phase Impairments: Reduced lingual movement/coordination;Impaired mastication Oral Phase Functional Implications: Prolonged oral transit Pharyngeal Phase Impairments: Suspected delayed Swallow;Multiple swallows   Thin Liquid Thin Liquid: Within functional limits Presentation: Cup;Straw    Nectar Thick Nectar  Thick Liquid: Not tested   Honey Thick Honey Thick Liquid: Not tested   Puree Puree:  Impaired Presentation: Spoon Oral Phase Impairments: Reduced lingual movement/coordination Oral Phase Functional Implications: Prolonged oral transit Pharyngeal Phase Impairments: Suspected delayed Swallow;Multiple swallows   Solid     Solid: Impaired Presentation: Spoon Oral Phase Impairments: Impaired mastication;Poor awareness of bolus;Reduced lingual movement/coordination Oral Phase Functional Implications: Oral holding;Other (comment) (extracted from oral cavity, unchewed)      Russell Ayers. MA, CCC-SLP Acute Rehabilitation Services   07/29/2020,10:33 AM

## 2020-07-29 NOTE — Progress Notes (Signed)
LTM EEG unhook: no skin breakdown

## 2020-07-30 DIAGNOSIS — K56609 Unspecified intestinal obstruction, unspecified as to partial versus complete obstruction: Secondary | ICD-10-CM | POA: Diagnosis not present

## 2020-07-30 DIAGNOSIS — R569 Unspecified convulsions: Secondary | ICD-10-CM | POA: Diagnosis not present

## 2020-07-30 LAB — COMPREHENSIVE METABOLIC PANEL
ALT: 14 U/L (ref 0–44)
AST: 28 U/L (ref 15–41)
Albumin: 2.9 g/dL — ABNORMAL LOW (ref 3.5–5.0)
Alkaline Phosphatase: 62 U/L (ref 38–126)
Anion gap: 7 (ref 5–15)
BUN: 14 mg/dL (ref 8–23)
CO2: 24 mmol/L (ref 22–32)
Calcium: 8.6 mg/dL — ABNORMAL LOW (ref 8.9–10.3)
Chloride: 104 mmol/L (ref 98–111)
Creatinine, Ser: 0.8 mg/dL (ref 0.61–1.24)
GFR, Estimated: >60 mL/min (ref >60)
Glucose, Bld: 142 mg/dL — ABNORMAL HIGH (ref 70–99)
Potassium: 3.4 mmol/L — ABNORMAL LOW (ref 3.5–5.1)
Sodium: 135 mmol/L (ref 135–145)
Total Bilirubin: 0.5 mg/dL (ref 0.3–1.2)
Total Protein: 5.8 g/dL — ABNORMAL LOW (ref 6.5–8.1)

## 2020-07-30 LAB — URINALYSIS, ROUTINE W REFLEX MICROSCOPIC

## 2020-07-30 LAB — URINALYSIS, MICROSCOPIC (REFLEX)
RBC / HPF: >50 RBC/hpf (ref 0–5)
Squamous Epithelial / HPF: NONE SEEN (ref 0–5)

## 2020-07-30 LAB — CBC WITH DIFFERENTIAL/PLATELET
Abs Immature Granulocytes: 0.04 10*3/uL (ref 0.00–0.07)
Basophils Absolute: 0 10*3/uL (ref 0.0–0.1)
Basophils Relative: 0 %
Eosinophils Absolute: 0.2 10*3/uL (ref 0.0–0.5)
Eosinophils Relative: 2 %
HCT: 32 % — ABNORMAL LOW (ref 39.0–52.0)
Hemoglobin: 11.3 g/dL — ABNORMAL LOW (ref 13.0–17.0)
Immature Granulocytes: 0 %
Lymphocytes Relative: 17 %
Lymphs Abs: 1.6 10*3/uL (ref 0.7–4.0)
MCH: 33 pg (ref 26.0–34.0)
MCHC: 35.3 g/dL (ref 30.0–36.0)
MCV: 93.6 fL (ref 80.0–100.0)
Monocytes Absolute: 0.7 10*3/uL (ref 0.1–1.0)
Monocytes Relative: 7 %
Neutro Abs: 6.9 10*3/uL (ref 1.7–7.7)
Neutrophils Relative %: 74 %
Platelets: 286 10*3/uL (ref 150–400)
RBC: 3.42 MIL/uL — ABNORMAL LOW (ref 4.22–5.81)
RDW: 12.8 % (ref 11.5–15.5)
WBC: 9.4 10*3/uL (ref 4.0–10.5)
nRBC: 0 % (ref 0.0–0.2)

## 2020-07-30 LAB — GLUCOSE, CAPILLARY
Glucose-Capillary: 140 mg/dL — ABNORMAL HIGH (ref 70–99)
Glucose-Capillary: 141 mg/dL — ABNORMAL HIGH (ref 70–99)
Glucose-Capillary: 153 mg/dL — ABNORMAL HIGH (ref 70–99)
Glucose-Capillary: 155 mg/dL — ABNORMAL HIGH (ref 70–99)
Glucose-Capillary: 166 mg/dL — ABNORMAL HIGH (ref 70–99)
Glucose-Capillary: 195 mg/dL — ABNORMAL HIGH (ref 70–99)

## 2020-07-30 MED ORDER — LACTATED RINGERS IV BOLUS
1000.0000 mL | Freq: Once | INTRAVENOUS | Status: AC
  Administered 2020-07-30: 1000 mL via INTRAVENOUS

## 2020-07-30 MED ORDER — POTASSIUM CHLORIDE 10 MEQ/100ML IV SOLN
10.0000 meq | INTRAVENOUS | Status: AC
  Administered 2020-07-30 (×4): 10 meq via INTRAVENOUS
  Filled 2020-07-30 (×4): qty 100

## 2020-07-30 MED ORDER — SODIUM CHLORIDE 0.9 % IV SOLN
1.0000 g | INTRAVENOUS | Status: DC
  Administered 2020-07-30 – 2020-08-02 (×4): 1 g via INTRAVENOUS
  Filled 2020-07-30: qty 10
  Filled 2020-07-30: qty 1
  Filled 2020-07-30 (×2): qty 10

## 2020-07-30 NOTE — Progress Notes (Signed)
  Speech Language Pathology Treatment: Dysphagia  Patient Details Name: Russell Ayers MRN: [id number] DOB: 11-11-1932 Today's Date: 07/30/2020 Time: 1191-4782 SLP Time Calculation (min) (ACUTE ONLY): 14 min  Assessment / Plan / Recommendation Clinical Impression  Mr Ayers was awake but drowsy during diagnostic intervention with puree texture and thin with goal of possible advancement. He states he eats "soft" foods at home (not much meat). He masticated graham cracker softened in applesauce without difficulty. First trial thin with straw and immediate cough not present with subsequent cup and straw sips when cued for smaller sips. His periods of lethargy are concern for higher textures and recommend he continue puree over weekend- pt in agreement.    HPI HPI: 87 y.o. male who presented to [facility] for abdominal pain found to have SBO. 07/28/20 pt had several seizures with EEG suggestive of moderate diffuse encephalopathy  and stated that the excessive beta activity seen in the background is most likely due to the effect of benzodiazepine and is a benign EEG pattern. PMH significant for DM, anxiety, HTN, inguinal hernia, and insomnia.      SLP Plan  Continue with current plan of care       Recommendations  Diet recommendations: Thin liquid;Dysphagia 1 (puree) Liquids provided via: Straw;Cup Medication Administration: Whole meds with puree Supervision: Patient able to self feed;Intermittent supervision to cue for compensatory strategies Compensations: Slow rate;Small sips/bites Postural Changes and/or Swallow Maneuvers: Seated upright 90 degrees                Oral Care Recommendations: Oral care BID Follow up Recommendations: 24 hour supervision/assistance SLP Visit Diagnosis: Dysphagia, oral phase (R13.11);Dysphagia, unspecified (R13.10) Plan: Continue with current plan of care       GO                Royce Macadamia 07/30/2020, 10:27 AM

## 2020-07-30 NOTE — Progress Notes (Signed)
NEUROLOGY PROGRESS NOTE  Subjective: No further seizures.  Mental status continues to improve. He denies any headache or other acute complaints but is generally tired.  Physical Exam  Constitutional: Appears well-developed and well-nourished.  Cardiovascular: Normal rate and regular rhythm.  Respiratory: Effort normal, non-labored breathing  Neuro:  Mental Status: Awake and watching TV. Oriented to person, place, year and situation. Aware that he originally presented with abdominal pain and recalls being told that he had seizures.  Able to follow commands easily Speech: Fluent with repetition and naming intact.  Cranial Nerves: L pupil 23mm, R pupil 68mm ( right with coloboma) Pupils are reactive to light. EOMI, face w/decreased right nasolabial fold, tongue midline w/no atrophy or fasciculations  Motor: Tone/bulk are normal.  Mild bilateral pronator drift of the upper extremities Sensory: Intact to light touch throughout  Deep Tendon Reflexes: 3+ and symmetric throughout, but improving compared to 3/10 Cerebellar: Intact finger-to-nose bilaterally Gait: Deferred   Pertinent Imaging/Labs:  07/27/20 CT Head WO Contrast No acute intracranial abnormality. Chronic microvascular ischemic changes.  07/27/20 MR Brain W WO Contrast  1. No acute intracranial abnormality. 2. Moderate atrophy and white matter disease likely reflects the sequela of chronic microvascular ischemia.  Pertinent data: 07/29/20 Vitamin B12 low normal at 245 07/29/20 Ammonia 15  CK peaked at 745 and now downtrending to 623 Creatinine stable at 0.80 B12 low normal at 245  Creatinine stable  CSF studies: RBC 39/2, WBC 0/0, glucose 85, 30 Gram stain and culture negative to date VZV IgG and HSV PCR pending  MRI Brain was negative for structural lesions.   He was placed on long term EEG monitoring that was discontinued today- pertinent for excessive beta activity likely due to benzodiazepine use, negative for seizures.    Assessment/Plan:  Russell Ayers is an 87 year old male w/pmh of diabetes, hypertension, generalized anxiety, glaucoma and insomnia who originally presented to OSH with abdominal pain found to have a small bowel obstruction. While being worked up there he developed seizures for which neurology was consulted. His generalized tonic clonic seizures are thought to be in the setting of benzodiazepine withdrawal; he has longstanding insomnia and anxiety for which he takes Ativan 2 mg BID + Temazepam 30 mg HS.   He was initially loaded with Vimpat and Keppra which were discontinued after his home benzodiazepines were resumed.  Infection/new concerning structural brain lesion have been ruled out with lumbar puncture and MRI as above.  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  Please page with any questions or concerns.  # Low normal B12 - Methylmalonic acid to assess for functional vitamin B12 deficiency, can be followed up by primary team or primary care physician - Empiric 1000 mcg vitamin B12 daily while awaiting methylmalonic acid  Brooke Dare MD-PhD [facility] [phone]  31 minutes were spent in the care of this patient today including discussion with ICU team, reviewing data as above and performing the exam.  Greater than 50% of the time was spent at the patient's bedside.

## 2020-07-30 NOTE — Progress Notes (Signed)
NAME:  Russell Ayers, MRN:  [id number], DOB:  Oct 17, 1932, LOS: 4 ADMISSION DATE:  07/25/2020, CONSULTATION DATE: 07/28/20 REFERRING MD:  Dr. Marland Mcalpine, CHIEF COMPLAINT:  AMS, Seizure    Brief History:  87 y/o M who presented to [facility] on 3/6 with nausea and lower abdominal pain. Work up consistent with SBO.  Which improved, he was ready to be discharged to SNF then he started having seizures likely from benzodiazepine withdrawal.  EEG remain negative for status or any more seizures  Past Medical History:  DM II HTN Constipation  Inguinal Hernia Glaucoma Anxiety  Insomnia   Significant Hospital Events:  3/06 Admit with abdominal pain in the setting of SBO  Consults:  Neurology General surgery PCCM  Procedures:    Significant Diagnostic Tests:   CT ABD/Pelvis w Contrast 3/7 >> multiple dilated loops of small bowel with a transition zone in the LLQ likely secondary to inflammatory changes in the distal ileum, these changes are consistent with a partial small bowel obstruction, diverticulosis without diverticulitis, large hiatal hernia, left inguinal  Hernia with a loop of sigmoid colon within although no obstructive changes noted   CT Head 3/8 >> no acute abnormality, chronic microvascular ischemic changes   EEG 3/9 >> negative for acute seizures MRI brain 3/9  >> 1. No acute intracranial abnormality.Moderate atrophy and white matter disease likely reflects the sequela of chronic microvascular ischemia.  Micro Data:  COVID 3/7 >> negative  Influenza A/B 3/7 >> negative   Antimicrobials:    Interim History / Subjective:  No more seizures, patient remained calm and comfortable  Objective   Blood pressure 100/66, pulse 100, temperature 98 F (36.7 C), temperature source Oral, resp. rate (!) 24, height 5\' 11"  (1.803 m), weight 75.3 kg, SpO2 100 %.        Intake/Output Summary (Last 24 hours) at 07/30/2020 1053 Last data filed at 07/30/2020 0700 Gross per 24 hour  Intake 399.83  ml  Output 325 ml  Net 74.83 ml   Filed Weights   07/25/20 2113  Weight: 75.3 kg    Examination:   Physical exam: General: Chronically ill-appearing male, lying on the bed HEENT: /AT, eyes anicteric.  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 EEG remain negative for more seizures Appreciate neurology input LP was negative for acute meningitis or encephalitis Continue home dose benzodiazepine MRI brain is negative for acute findings  Acute Metabolic Encephalopathy  s/p seizure, postictal state with ativan administration. CT head negative  Mental status has improved, now he is at baseline Monitor electrolytes Continue neurochecks every 1 hour  Left Inguinal Hernia  Reproducible on prior exam notes by [facility]   Hx HTN  -hold home benazepril, HCTZ -follow BP trend   Hyponatremia  Hypokalemia  Hypophosphatemia  Monitor and replace electrolytes  Normocytic Anemia  No hx colonoscopy per prior notes.  -trend CBC -follow up anemia panel (ordered per [facility]) -monitor for bleeding   Hx BPH  -finasteride, terazosin when able to take PO's  Glaucoma  -timolol,  latanoprost QHS   Best practice (evaluated daily)  Diet: Pured diet Pain/Anxiety/Delirium protocol (if indicated): n/a  VAP protocol (if indicated): n/a  DVT prophylaxis: SCD GI prophylaxis: N/A Glucose control: N/A Mobility: PT/OT evaluation Disposition: MedSurg floor  Goals of Care:  Last date of  multidisciplinary goals of care discussion: Pending Family and staff present:  Summary of discussion:  Follow up goals of care discussion due:  Code Status: Full code  Labs   CBC: Recent Labs  Lab 07/25/20 2126 07/26/20 0454 07/27/20 0510 07/28/20 0437 07/28/20 1318 07/29/20 0234 07/30/20 0046  WBC 10.6*   < > 7.1 6.9 7.6 8.4 9.4  NEUTROABS 8.0*  --   --   --  5.6 5.7 6.9  HGB 13.0   < > 11.6* 10.9* 11.6* 11.1* 11.3*  HCT 38.2*   < > 35.0* 33.1* 33.0* 31.6* 32.0*  MCV 93.9   < > 96.2 97.1 93.2 92.9 93.6  PLT 277   < > 230 221 241 231 286   < > = values in this interval not displayed.    Basic Metabolic Panel: Recent Labs  Lab 07/27/20 0510 07/28/20 0437 07/28/20 1318 07/29/20 0234 07/30/20 0046  NA 134* 133* 134* 135 135  K 3.7 3.6 3.6 3.6 3.4*  CL 104 104 101 102 104  CO2 21* 21* 24 25 24   GLUCOSE 136* 131* 127* 127* 142*  BUN 7* 7* 6* 5* 14  CREATININE 0.65 0.69 0.82 0.79 0.80  CALCIUM 8.8* 8.7* 9.0 8.8* 8.6*  MG 1.9 1.8 2.0  --   --   PHOS  --  1.9* 2.4*  --   --    GFR: Estimated Creatinine Clearance: 70.6 mL/min (by C-G formula based on SCr of 0.8 mg/dL). Recent Labs  Lab 07/28/20 0437 07/28/20 1318 07/28/20 1621 07/29/20 0234 07/30/20 0046  WBC 6.9 7.6  --  8.4 9.4  LATICACIDVEN  --  2.1* 2.1*  --   --     Liver Function Tests: Recent Labs  Lab 07/25/20 2126 07/26/20 0454 07/28/20 1318 07/29/20 0234 07/30/20 0046  AST 23 18 26 30 28   ALT 19 17 16 17 14   ALKPHOS 68 60 62 51 62  BILITOT 0.9 0.8 0.6 0.6 0.5  PROT 7.3 6.3* 6.2* 6.0* 5.8*  ALBUMIN 4.1 3.5 3.3* 3.1* 2.9*   Recent Labs  Lab 07/25/20 2126  LIPASE 28   Recent Labs  Lab 07/29/20 0234  AMMONIA 15    ABG No results found for: PHART, PCO2ART, PO2ART, HCO3, TCO2, ACIDBASEDEF, O2SAT   Coagulation Profile: Recent Labs  Lab 07/28/20 1318  INR 1.1    Cardiac Enzymes: Recent Labs  Lab 07/28/20 1318 07/29/20 0234 07/29/20 1257 07/29/20 1652  CKTOTAL 324 560* 745*  623*    HbA1C: No results found for: HGBA1C  CBG: Recent Labs  Lab 07/29/20 1543 07/29/20 2041 07/30/20 0040 07/30/20 0412 07/30/20 0745  GLUCAP 130* 204* 141* 166* 155*    Review of Systems:   Unable to complete as patient is post-ictal.   Past Medical History:  He,  has a past medical history of Diabetes mellitus without complication (HCC), Generalized anxiety disorder, Glaucoma, Hypertension, Inguinal hernia, Insomnia, and Prostatism.   Surgical History:   Past Surgical History:  Procedure Laterality Date  . CATARACT EXTRACTION, BILATERAL    . glaucoma       Social History:   reports that he quit smoking about 31 years ago. He has never used smokeless tobacco. He reports that  he does not drink alcohol and does not use drugs.   Family History:  His family history includes Cancer in his brother; Heart attack in his brother.   Allergies No Known Allergies   Home Medications  Prior to Admission medications   Medication Sig Start Date End Date Taking? Authorizing Provider  aspirin EC 81 MG tablet Take 162 mg by mouth every 4 (four) hours as needed for mild pain. Swallow whole.   Yes [provider]  benazepril (LOTENSIN) 10 MG tablet Take 10 mg by mouth daily. 10/20/19  Yes [provider]  cetirizine (ZYRTEC) 10 MG chewable tablet Chew 10-20 mg by mouth See admin instructions. 20mg  in the morning  10mg  at noon 10mg  in the afternoon   Yes [provider]  diphenhydrAMINE (BENADRYL) 25 MG tablet Take 25 mg by mouth at bedtime.   Yes [provider]  finasteride (PROSCAR) 5 MG tablet Take 5 mg by mouth daily. 09/30/18  Yes [provider]  fluticasone (FLONASE) 50 MCG/ACT nasal spray Place 1-2 sprays into both nostrils daily as needed for allergies.   Yes [provider]  hydrochlorothiazide (HYDRODIURIL) 12.5 MG tablet Take 12.5 mg by mouth daily. 09/21/18  Yes [provider]  latanoprost (XALATAN) 0.005 %  ophthalmic solution Place 1 drop into both eyes at bedtime. 11/20/18  Yes [provider]  LORazepam (ATIVAN) 2 MG tablet Take 2 mg by mouth 2 (two) times daily.  11/19/18  Yes [provider]  Melatonin 10 MG TABS Take 20-40 mg by mouth See admin instructions. 20mg  every night And 20mg  more if needed for sleep   Yes [provider]  PSYLLIUM HUSK PO Take 1 tablet by mouth 3 (three) times daily.   Yes [provider]  Saw Palmetto, Serenoa repens, (SAW PALMETTO PO) Take 1 tablet by mouth daily.   Yes [provider]  temazepam (RESTORIL) 30 MG capsule Take 30 mg by mouth at bedtime. 11/19/18  Yes [provider]  terazosin (HYTRIN) 1 MG capsule Take 1 mg by mouth at bedtime. 09/30/18  Yes [provider]  timolol (TIMOPTIC) 0.5 % ophthalmic solution Place 1 drop into both eyes daily. 09/20/18  Yes [provider]     MD [facility] See Amion for pager If no response to pager, please call [phone] until 7pm After 7pm, Please call E-link [phone]

## 2020-07-30 NOTE — Progress Notes (Signed)
K+ 3.4 Replaced per protocol  

## 2020-07-31 LAB — CBC WITH DIFFERENTIAL/PLATELET
Abs Immature Granulocytes: 0.03 10*3/uL (ref 0.00–0.07)
Basophils Absolute: 0 10*3/uL (ref 0.0–0.1)
Basophils Relative: 0 %
Eosinophils Absolute: 0.2 10*3/uL (ref 0.0–0.5)
Eosinophils Relative: 3 %
HCT: 29.9 % — ABNORMAL LOW (ref 39.0–52.0)
Hemoglobin: 10.4 g/dL — ABNORMAL LOW (ref 13.0–17.0)
Immature Granulocytes: 0 %
Lymphocytes Relative: 27 %
Lymphs Abs: 2.3 10*3/uL (ref 0.7–4.0)
MCH: 32.9 pg (ref 26.0–34.0)
MCHC: 34.8 g/dL (ref 30.0–36.0)
MCV: 94.6 fL (ref 80.0–100.0)
Monocytes Absolute: 0.7 10*3/uL (ref 0.1–1.0)
Monocytes Relative: 9 %
Neutro Abs: 5.3 10*3/uL (ref 1.7–7.7)
Neutrophils Relative %: 61 %
Platelets: 218 10*3/uL (ref 150–400)
RBC: 3.16 MIL/uL — ABNORMAL LOW (ref 4.22–5.81)
RDW: 12.6 % (ref 11.5–15.5)
WBC: 8.6 10*3/uL (ref 4.0–10.5)
nRBC: 0 % (ref 0.0–0.2)

## 2020-07-31 LAB — COMPREHENSIVE METABOLIC PANEL
ALT: 19 U/L (ref 0–44)
AST: 38 U/L (ref 15–41)
Albumin: 2.5 g/dL — ABNORMAL LOW (ref 3.5–5.0)
Alkaline Phosphatase: 71 U/L (ref 38–126)
Anion gap: 5 (ref 5–15)
BUN: 17 mg/dL (ref 8–23)
CO2: 25 mmol/L (ref 22–32)
Calcium: 8.5 mg/dL — ABNORMAL LOW (ref 8.9–10.3)
Chloride: 105 mmol/L (ref 98–111)
Creatinine, Ser: 0.9 mg/dL (ref 0.61–1.24)
GFR, Estimated: >60 mL/min (ref >60)
Glucose, Bld: 132 mg/dL — ABNORMAL HIGH (ref 70–99)
Potassium: 4.1 mmol/L (ref 3.5–5.1)
Sodium: 135 mmol/L (ref 135–145)
Total Bilirubin: 1.4 mg/dL — ABNORMAL HIGH (ref 0.3–1.2)
Total Protein: 5.3 g/dL — ABNORMAL LOW (ref 6.5–8.1)

## 2020-07-31 LAB — GLUCOSE, CAPILLARY
Glucose-Capillary: 214 mg/dL — ABNORMAL HIGH (ref 70–99)
Glucose-Capillary: 120 mg/dL — ABNORMAL HIGH (ref 70–99)
Glucose-Capillary: 177 mg/dL — ABNORMAL HIGH (ref 70–99)
Glucose-Capillary: 152 mg/dL — ABNORMAL HIGH (ref 70–99)
Glucose-Capillary: 297 mg/dL — ABNORMAL HIGH (ref 70–99)

## 2020-07-31 MED ORDER — LACTATED RINGERS IV BOLUS
500.0000 mL | Freq: Once | INTRAVENOUS | Status: AC
  Administered 2020-07-31: 500 mL via INTRAVENOUS

## 2020-07-31 MED ORDER — LACTATED RINGERS IV SOLN: INTRAVENOUS | Status: AC

## 2020-07-31 NOTE — Progress Notes (Signed)
PROGRESS NOTE    Russell Ayers  WUJ:[id number] DOB: 12/03/1932 DOA: 07/25/2020 PCP: Soundra Pilon, FNP   Chief Complaint  Patient presents with  . Abdominal Pain    Brief Narrative: Patient is an 87 year old Caucasian male with Russell Ayers past medical history significant for but not limited to hypertension and prediabetes as well as other comorbidities who presented to the ED with complaints of abdominal pain.  Russell Ayers CT scan of the abdomen pelvis revealed small bowel obstruction and general surgery was consulted.  Patient was hospitalized for further management and his small bowel obstruction appears to have improved.  On 3/8, the patient started becoming confused and was unable to tell the physician if he had any episodes of vomiting.  The patient was found to be actively seizing on 3/9.  He was transferred to the ICU for LTM and seizure management.  Seizures were thought to be 2/2 benzodiazepine withdrawal.  EEG were negative for status or additional seizures.     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 -new onset seizure 3/9 - pt takes 2 mg BID of ativan at home, was on prn dosing 0.5 and received it 1 x since admission  - EEG 3/10 with no seizures or epileptiform discharges - EEG 3/9 with moderate diffuse encephalopathy, nonspecific - LP negative for acute meningitis/encephalitis  - resume home benzo dose - MRI brain without acute intracranial abnormality  - neurology c/s, appreciate recs - continue ativan 2 mg BID and temazepam 30 mg qHS - no indication for long term AEDs  Acute Metabolic Encephalopathy - likely 2/2 benzo withdrawal - improved - delirium precautions  Small bowel obstruction -CT scan raised concern for transition point in the terminal ileum. Concern raised for inflammatory changes in the distal  ileum - KUB 3/8 with improved small bowel distension, improving SBO - surgery appears to have signed off on 3/9  Left-sided inguinal hernia -Apparently has seen general surgery in the outpatient setting for same. No evidence for bowel incarceration at this time.  Essential Hypertension - continue benazepril - HCTZ appears to be on hold - follow   Hyponatremia and hypokalemia Continue to monitor   Normocytic Anemia -continue to monitor   BPH -Continue with finasteride 5 mg p.o. daily as well Terazosin 1 mg p.o. nightly  History of Glaucoma Continue with eyedrops with Timolol 1 drop and Latanoprost 0.005% 1 drop Both Eyes qHS  Hypophosphatemia -ctm   DVT prophylaxis: SCD Code Status: full  Family Communication: none at bedsdie Disposition:   Status is: Inpatient  Remains inpatient appropriate because:Inpatient level of care appropriate due to severity of illness   Dispo: The patient is from: Home              Anticipated d/c is to: SNF              Patient currently is not medically stable to d/c.   Difficult to place patient No  Consultants:   Neurology  Surgery  PCCM  Procedures: EEG IMPRESSION: This study is suggestive of moderate diffuse encephalopathy, nonspecific etiology. No seizures or epileptiform discharges were seen throughout the recording. The excessive beta activity seen in the background is most likely due to the effect of benzodiazepine and is Russell Ayers benign EEG pattern.   Antimicrobials:  Anti-infectives (From admission, onward)   Start  Dose/Rate Route Frequency Ordered Stop   07/30/20 1845  cefTRIAXone (ROCEPHIN) 1 g in sodium chloride 0.9 % 100 mL IVPB        1 g 200 mL/hr over 30 Minutes Intravenous Every 24 hours 07/30/20 1748       Subjective: Denies complaints, Russell Ayers&O  Objective: Vitals:   07/31/20 0406 07/31/20 0814 07/31/20 1131 07/31/20 1519  BP: (!) 127/54 (!) 140/59 (!) 127/56 (!) 117/46  Pulse: 76 71 65 73  Resp: 19  18 18 18   Temp: 98.2 F (36.8 C) (!) 97.4 F (36.3 C) (!) 97.2 F (36.2 C) (!) 97.4 F (36.3 C)  TempSrc: Oral Axillary Oral Oral  SpO2: 96% 98% 98% 98%  Weight:      Height:        Intake/Output Summary (Last 24 hours) at 07/31/2020 1813 Last data filed at 07/31/2020 0930 Gross per 24 hour  Intake 515.37 ml  Output 600 ml  Net -84.63 ml   Filed Weights   07/25/20 2113  Weight: 75.3 kg    Examination:  General exam: Appears calm and comfortable  Respiratory system: Clear to auscultation. Respiratory effort normal. Cardiovascular system: S1 & S2 heard, RRR Gastrointestinal system: Abdomen is nondistended, soft and nontender Central nervous system: Alert and oriented, appears generally weak. No focal neurological deficits. Extremities: no LEE  Skin: No rashes, lesions or ulcers Psychiatry: Judgement and insight appear normal. Mood & affect appropriate.     Data Reviewed: I have personally reviewed following labs and imaging studies  CBC: Recent Labs  Lab 07/25/20 2126 07/26/20 0454 07/28/20 0437 07/28/20 1318 07/29/20 0234 07/30/20 0046 07/31/20 0228  WBC 10.6*   < > 6.9 7.6 8.4 9.4 8.6  NEUTROABS 8.0*  --   --  5.6 5.7 6.9 5.3  HGB 13.0   < > 10.9* 11.6* 11.1* 11.3* 10.4*  HCT 38.2*   < > 33.1* 33.0* 31.6* 32.0* 29.9*  MCV 93.9   < > 97.1 93.2 92.9 93.6 94.6  PLT 277   < > 221 241 231 286 218   < > = values in this interval not displayed.    Basic Metabolic Panel: Recent Labs  Lab 07/27/20 0510 07/28/20 0437 07/28/20 1318 07/29/20 0234 07/30/20 0046 07/31/20 0228  NA 134* 133* 134* 135 135 135  K 3.7 3.6 3.6 3.6 3.4* 4.1  CL 104 104 101 102 104 105  CO2 21* 21* 24 25 24 25   GLUCOSE 136* 131* 127* 127* 142* 132*  BUN 7* 7* 6* 5* 14 17  CREATININE 0.65 0.69 0.82 0.79 0.80 0.90  CALCIUM 8.8* 8.7* 9.0 8.8* 8.6* 8.5*  MG 1.9 1.8 2.0  --   --   --   PHOS  --  1.9* 2.4*  --   --   --     GFR: Estimated Creatinine Clearance: 62.8 mL/min (by C-G  formula based on SCr of 0.9 mg/dL).  Liver Function Tests: Recent Labs  Lab 07/26/20 0454 07/28/20 1318 07/29/20 0234 07/30/20 0046 07/31/20 0228  AST 18 26 30 28  38  ALT 17 16 17 14 19   ALKPHOS 60 62 51 62 71  BILITOT 0.8 0.6 0.6 0.5 1.4*  PROT 6.3* 6.2* 6.0* 5.8* 5.3*  ALBUMIN 3.5 3.3* 3.1* 2.9* 2.5*    CBG: Recent Labs  Lab 07/30/20 1510 07/30/20 2352 07/31/20 0409 07/31/20 0806 07/31/20 1133  GLUCAP 140* 153* 214* 120* 177*     Recent Results (from the past 240 hour(s))  Resp Panel by  RT-PCR (Flu Russell Ayers&B, Covid) Nasopharyngeal Swab     Status: None   Collection Time: 07/26/20  2:07 AM   Specimen: Nasopharyngeal Swab; Nasopharyngeal(NP) swabs in vial transport medium  Result Value Ref Range Status   SARS Coronavirus 2 by RT PCR NEGATIVE NEGATIVE Final    Comment: (NOTE) SARS-CoV-2 target nucleic acids are NOT DETECTED.  The SARS-CoV-2 RNA is generally detectable in upper respiratory specimens during the acute phase of infection. The lowest concentration of SARS-CoV-2 viral copies this assay can detect is 138 copies/mL. Russell Ayers negative result does not preclude SARS-Cov-2 infection and should not be used as the sole basis for treatment or other patient management decisions. Russell Ayers negative result may occur with  improper specimen collection/handling, submission of specimen other than nasopharyngeal swab, presence of viral mutation(s) within the areas targeted by this assay, and inadequate number of viral copies(<138 copies/mL). Russell Ayers negative result must be combined with clinical observations, patient history, and epidemiological information. The expected result is Negative.  Fact Sheet for Patients:  BloggerCourse.com  Fact Sheet for Healthcare Providers:  SeriousBroker.it  This test is no t yet approved or cleared by the [place] FDA and  has been authorized for detection and/or diagnosis of SARS-CoV-2 by FDA under an  Emergency Use Authorization (EUA). This EUA will remain  in effect (meaning this test can be used) for the duration of the COVID-19 declaration under Section 564(b)(1) of the Act, 21 U.S.C.section 360bbb-3(b)(1), unless the authorization is terminated  or revoked sooner.       Influenza Russell Ayers by PCR NEGATIVE NEGATIVE Final   Influenza B by PCR NEGATIVE NEGATIVE Final    Comment: (NOTE) The Xpert Xpress SARS-CoV-2/FLU/RSV plus assay is intended as an aid in the diagnosis of influenza from Nasopharyngeal swab specimens and should not be used as Russell Ayers sole basis for treatment. Nasal washings and aspirates are unacceptable for Xpert Xpress SARS-CoV-2/FLU/RSV testing.  Fact Sheet for Patients: BloggerCourse.com  Fact Sheet for Healthcare Providers: SeriousBroker.it  This test is not yet approved or cleared by the [place] FDA and has been authorized for detection and/or diagnosis of SARS-CoV-2 by FDA under an Emergency Use Authorization (EUA). This EUA will remain in effect (meaning this test can be used) for the duration of the COVID-19 declaration under Section 564(b)(1) of the Act, 21 U.S.C. section 360bbb-3(b)(1), unless the authorization is terminated or revoked.  Performed at [facility], [address], [place], [place] [address]   MRSA PCR Screening     Status: None   Collection Time: 07/28/20  1:33 PM   Specimen: Nasal Mucosa; Nasopharyngeal  Result Value Ref Range Status   MRSA by PCR NEGATIVE NEGATIVE Final    Comment:        The GeneXpert MRSA Assay (FDA approved for NASAL specimens only), is one component of Russell Ayers comprehensive MRSA colonization surveillance program. It is not intended to diagnose MRSA infection nor to guide or monitor treatment for MRSA infections. Performed at [facility], [address] [place], [place] [address]   CSF culture     Status: None (Preliminary result)    Collection Time: 07/28/20  2:29 PM   Specimen: CSF; Cerebrospinal Fluid  Result Value Ref Range Status   Specimen Description CSF  Final   Special Requests NONE  Final   Gram Stain   Final    WBC PRESENT, PREDOMINANTLY MONONUCLEAR NO ORGANISMS SEEN CYTOSPIN SMEAR    Culture   Final    NO GROWTH 3  DAYS Performed at [facility], [address], [place], [place] [address]    Report Status PENDING  Incomplete         Radiology Studies: No results found.      Scheduled Meds: . benazepril  10 mg Oral Daily  . Chlorhexidine Gluconate Cloth  6 each Topical Daily  . finasteride  5 mg Oral Daily  . latanoprost  1 drop Both Eyes QHS  . LORazepam  2 mg Oral BID   Or  . LORazepam  2 mg Intravenous BID  . temazepam  30 mg Oral QHS  . terazosin  1 mg Oral QHS  . timolol  1 drop Both Eyes BID  . vitamin B-12  1,000 mcg Oral Daily   Continuous Infusions: . cefTRIAXone (ROCEPHIN)  IV 1 g (07/31/20 1808)     LOS: 5 days    Time spent: over 30 min    Lacretia Nicks, MD [facility]   To contact the attending provider between 7A-7P or the covering provider during after hours 7P-7A, please log into the web site [email or web address] and access using universal [place] password for that web site. If you do not have the password, please call the hospital operator.  07/31/2020, 6:13 PM

## 2020-08-01 LAB — COMPREHENSIVE METABOLIC PANEL
ALT: 24 U/L (ref 0–44)
AST: 26 U/L (ref 15–41)
Albumin: 2.4 g/dL — ABNORMAL LOW (ref 3.5–5.0)
Alkaline Phosphatase: 85 U/L (ref 38–126)
Anion gap: 5 (ref 5–15)
BUN: 19 mg/dL (ref 8–23)
CO2: 25 mmol/L (ref 22–32)
Calcium: 8.2 mg/dL — ABNORMAL LOW (ref 8.9–10.3)
Chloride: 102 mmol/L (ref 98–111)
Creatinine, Ser: 0.76 mg/dL (ref 0.61–1.24)
GFR, Estimated: >60 mL/min (ref >60)
Glucose, Bld: 151 mg/dL — ABNORMAL HIGH (ref 70–99)
Potassium: 4.1 mmol/L (ref 3.5–5.1)
Sodium: 132 mmol/L — ABNORMAL LOW (ref 135–145)
Total Bilirubin: 0.6 mg/dL (ref 0.3–1.2)
Total Protein: 5.2 g/dL — ABNORMAL LOW (ref 6.5–8.1)

## 2020-08-01 LAB — GLUCOSE, CAPILLARY
Glucose-Capillary: 148 mg/dL — ABNORMAL HIGH (ref 70–99)
Glucose-Capillary: 160 mg/dL — ABNORMAL HIGH (ref 70–99)
Glucose-Capillary: 163 mg/dL — ABNORMAL HIGH (ref 70–99)
Glucose-Capillary: 195 mg/dL — ABNORMAL HIGH (ref 70–99)
Glucose-Capillary: 199 mg/dL — ABNORMAL HIGH (ref 70–99)
Glucose-Capillary: 214 mg/dL — ABNORMAL HIGH (ref 70–99)

## 2020-08-01 LAB — CBC WITH DIFFERENTIAL/PLATELET
Abs Immature Granulocytes: 0.02 10*3/uL (ref 0.00–0.07)
Basophils Absolute: 0 10*3/uL (ref 0.0–0.1)
Basophils Relative: 1 %
Eosinophils Absolute: 0.4 10*3/uL (ref 0.0–0.5)
Eosinophils Relative: 6 %
HCT: 28.9 % — ABNORMAL LOW (ref 39.0–52.0)
Hemoglobin: 10.1 g/dL — ABNORMAL LOW (ref 13.0–17.0)
Immature Granulocytes: 0 %
Lymphocytes Relative: 30 %
Lymphs Abs: 2 10*3/uL (ref 0.7–4.0)
MCH: 32.8 pg (ref 26.0–34.0)
MCHC: 34.9 g/dL (ref 30.0–36.0)
MCV: 93.8 fL (ref 80.0–100.0)
Monocytes Absolute: 0.6 10*3/uL (ref 0.1–1.0)
Monocytes Relative: 9 %
Neutro Abs: 3.7 10*3/uL (ref 1.7–7.7)
Neutrophils Relative %: 54 %
Platelets: 221 10*3/uL (ref 150–400)
RBC: 3.08 MIL/uL — ABNORMAL LOW (ref 4.22–5.81)
RDW: 12.7 % (ref 11.5–15.5)
WBC: 6.8 10*3/uL (ref 4.0–10.5)
nRBC: 0 % (ref 0.0–0.2)

## 2020-08-01 LAB — FOLATE: Folate: 6.8 ng/mL (ref >5.9)

## 2020-08-01 LAB — CSF CULTURE W GRAM STAIN: Culture: NO GROWTH

## 2020-08-01 LAB — TSH: TSH: 1.757 u[IU]/mL (ref 0.350–4.500)

## 2020-08-01 LAB — SARS CORONAVIRUS 2 (TAT 6-24 HRS): SARS Coronavirus 2: NEGATIVE

## 2020-08-01 MED ORDER — POLYETHYLENE GLYCOL 3350 17 G PO PACK
17.0000 g | PACK | Freq: Every day | ORAL | Status: DC
  Administered 2020-08-01 – 2020-08-02 (×2): 17 g via ORAL
  Filled 2020-08-01 (×2): qty 1

## 2020-08-01 NOTE — Plan of Care (Signed)
  Problem: Clinical Measurements: Goal: Will remain free from infection Outcome: Progressing   Problem: Clinical Measurements: Goal: Ability to maintain clinical measurements within normal limits will improve Outcome: Progressing   Problem: Health Behavior/Discharge Planning: Goal: Ability to manage health-related needs will improve Outcome: Progressing   Problem: Education: Goal: Knowledge of General Education information will improve Description: Including pain rating scale, medication(s)/side effects and non-pharmacologic comfort measures Outcome: Progressing   

## 2020-08-01 NOTE — Progress Notes (Signed)
PROGRESS NOTE    Russell Ayers  VVO:[id number] DOB: [birth year] DOA: 07/25/2020 PCP: Soundra Pilon, FNP   Chief Complaint  Patient presents with  . Abdominal Pain    Brief Narrative: Patient is an 87 year old Caucasian male with Russell Ayers past medical history significant for but not limited to hypertension and prediabetes as well as other comorbidities who presented to the ED with complaints of abdominal pain.  Tache Bobst CT scan of the abdomen pelvis revealed small bowel obstruction and general surgery was consulted.  Patient was hospitalized for further management and his small bowel obstruction appears to have improved.  On 3/8, the patient started becoming confused and was unable to tell the physician if he had any episodes of vomiting.  The patient was found to be actively seizing on 3/9.  He was transferred to the ICU for LTM and seizure management.  Seizures were thought to be 2/2 benzodiazepine withdrawal.  EEG were negative for status or additional seizures.     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 -new onset seizure 3/9 - pt takes 2 mg BID of ativan at home, was on prn dosing 0.5 and received it 1 x since admission  - EEG 3/10 with no seizures or epileptiform discharges - EEG 3/9 with moderate diffuse encephalopathy, nonspecific - LP negative for acute meningitis/encephalitis  - resume home benzo dose - MRI brain without acute intracranial abnormality  - neurology c/s, appreciate recs - continue ativan 2 mg BID and temazepam 30 mg qHS - no indication for long term AEDs  Acute Metabolic Encephalopathy - likely 2/2 benzo withdrawal - improved - delirium precautions - follow TSH  Low normal b12  - follow mma  - follow folate  UTI  - currently on ceftriaxone - follow pending culture results   Generalized  Weakness  Deconditioning - suspect related to acute hospitalization, continue therapy - plan for SNF at discharge - was living in ILF at baseline, driving, buying groceries, walking with cane  Small bowel obstruction -CT scan raised concern for transition point in the terminal ileum. Concern raised for inflammatory changes in the distal ileum - KUB 3/8 with improved small bowel distension, improving SBO - surgery appears to have signed off on 3/9 - passing gas, last BM 3/11 - follow   Left-sided inguinal hernia -Apparently has seen general surgery in the outpatient setting for same. No evidence for bowel incarceration at this time.  Essential Hypertension - continue benazepril - HCTZ appears to be on hold - follow   Hyponatremia and hypokalemia Continue to monitor   Normocytic Anemia -continue to monitor   BPH -Continue with finasteride 5 mg p.o. daily as well Terazosin 1 mg p.o. nightly  History of Glaucoma Continue with eyedrops with Timolol 1 drop and Latanoprost 0.005% 1 drop Both Eyes qHS  Hypophosphatemia -ctm   DVT prophylaxis: SCD Code Status: full  Family Communication: none at bedsdie Disposition:   Status is: Inpatient  Remains inpatient appropriate because:Inpatient level of care appropriate due to severity of illness   Dispo: The patient is from: Home              Anticipated d/c is to: SNF              Patient currently is not medically stable to d/c.   Difficult to place patient  No  Consultants:   Neurology  Surgery  PCCM  Procedures: EEG IMPRESSION: This study is suggestive of moderate diffuse encephalopathy, nonspecific etiology. No seizures or epileptiform discharges were seen throughout the recording. The excessive beta activity seen in the background is most likely due to the effect of benzodiazepine and is Danisha Brassfield benign EEG pattern.   Antimicrobials:  Anti-infectives (From admission, onward)   Start     Dose/Rate Route Frequency  Ordered Stop   07/30/20 1845  cefTRIAXone (ROCEPHIN) 1 g in sodium chloride 0.9 % 100 mL IVPB        1 g 200 mL/hr over 30 Minutes Intravenous Every 24 hours 07/30/20 1748       Subjective: No new complaints today  Objective: Vitals:   08/01/20 0029 08/01/20 0339 08/01/20 0728 08/01/20 0729  BP: 122/60 (!) 112/56 128/74   Pulse: 80 79 (!) 41 79  Resp: 18 19 16    Temp: 97.6 F (36.4 C) 97.8 F (36.6 C) 97.7 F (36.5 C)   TempSrc:  Oral Oral   SpO2: 94% 96% 96%   Weight:      Height:        Intake/Output Summary (Last 24 hours) at 08/01/2020 1119 Last data filed at 08/01/2020 1038 Gross per 24 hour  Intake 1210 ml  Output 825 ml  Net 385 ml   Filed Weights   07/25/20 2113  Weight: 75.3 kg    Examination:  General: No acute distress. appears stated age. Cardiovascular: Heart sounds show Bernese Doffing regular rate, and rhythm.  Lungs: Clear to auscultation bilaterally Abdomen: Soft, nontender, nondistended  Neurological: Alert and oriented 3. Moves all extremities 4. Cranial nerves II through XII grossly intact. Skin: Warm and dry. No rashes or lesions. Extremities: No clubbing or cyanosis. No edema.     Data Reviewed: I have personally reviewed following labs and imaging studies  CBC: Recent Labs  Lab 07/28/20 1318 07/29/20 0234 07/30/20 0046 07/31/20 0228 08/01/20 0413  WBC 7.6 8.4 9.4 8.6 6.8  NEUTROABS 5.6 5.7 6.9 5.3 3.7  HGB 11.6* 11.1* 11.3* 10.4* 10.1*  HCT 33.0* 31.6* 32.0* 29.9* 28.9*  MCV 93.2 92.9 93.6 94.6 93.8  PLT 241 231 286 218 221    Basic Metabolic Panel: Recent Labs  Lab 07/27/20 0510 07/28/20 0437 07/28/20 1318 07/29/20 0234 07/30/20 0046 07/31/20 0228 08/01/20 0413  NA 134* 133* 134* 135 135 135 132*  K 3.7 3.6 3.6 3.6 3.4* 4.1 4.1  CL 104 104 101 102 104 105 102  CO2 21* 21* 24 25 24 25 25   GLUCOSE 136* 131* 127* 127* 142* 132* 151*  BUN 7* 7* 6* 5* 14 17 19   CREATININE 0.65 0.69 0.82 0.79 0.80 0.90 0.76  CALCIUM 8.8* 8.7* 9.0  8.8* 8.6* 8.5* 8.2*  MG 1.9 1.8 2.0  --   --   --   --   PHOS  --  1.9* 2.4*  --   --   --   --     GFR: Estimated Creatinine Clearance: 70.6 mL/min (by C-G formula based on SCr of 0.76 mg/dL).  Liver Function Tests: Recent Labs  Lab 07/28/20 1318 07/29/20 0234 07/30/20 0046 07/31/20 0228 08/01/20 0413  AST 26 30 28  38 26  ALT 16 17 14 19 24   ALKPHOS 62 51 62 71 85  BILITOT 0.6 0.6 0.5 1.4* 0.6  PROT 6.2* 6.0* 5.8* 5.3* 5.2*  ALBUMIN 3.3* 3.1* 2.9* 2.5* 2.4*    CBG: Recent Labs  Lab 07/31/20 1831  07/31/20 2013 08/01/20 0025 08/01/20 0333 08/01/20 0731  GLUCAP 152* 297* 163* 160* 148*     Recent Results (from the past 240 hour(s))  Resp Panel by RT-PCR (Flu Natalio Salois&B, Covid) Nasopharyngeal Swab     Status: None   Collection Time: 07/26/20  2:07 AM   Specimen: Nasopharyngeal Swab; Nasopharyngeal(NP) swabs in vial transport medium  Result Value Ref Range Status   SARS Coronavirus 2 by RT PCR NEGATIVE NEGATIVE Final    Comment: (NOTE) SARS-CoV-2 target nucleic acids are NOT DETECTED.  The SARS-CoV-2 RNA is generally detectable in upper respiratory specimens during the acute phase of infection. The lowest concentration of SARS-CoV-2 viral copies this assay can detect is 138 copies/mL. Jhostin Epps negative result does not preclude SARS-Cov-2 infection and should not be used as the sole basis for treatment or other patient management decisions. Nykayla Marcelli negative result may occur with  improper specimen collection/handling, submission of specimen other than nasopharyngeal swab, presence of viral mutation(s) within the areas targeted by this assay, and inadequate number of viral copies(<138 copies/mL). Neev Mcmains negative result must be combined with clinical observations, patient history, and epidemiological information. The expected result is Negative.  Fact Sheet for Patients:  BloggerCourse.comhttps://www.fda.gov/media/152166/download  Fact Sheet for Healthcare Providers:   SeriousBroker.ithttps://www.fda.gov/media/152162/download  This test is no t yet approved or cleared by the [place] FDA and  has been authorized for detection and/or diagnosis of SARS-CoV-2 by FDA under an Emergency Use Authorization (EUA). This EUA will remain  in effect (meaning this test can be used) for the duration of the COVID-19 declaration under Section 564(b)(1) of the Act, 21 U.S.C.section 360bbb-3(b)(1), unless the authorization is terminated  or revoked sooner.       Influenza Jevon Littlepage by PCR NEGATIVE NEGATIVE Final   Influenza B by PCR NEGATIVE NEGATIVE Final    Comment: (NOTE) The Xpert Xpress SARS-CoV-2/FLU/RSV plus assay is intended as an aid in the diagnosis of influenza from Nasopharyngeal swab specimens and should not be used as Johnney Scarlata sole basis for treatment. Nasal washings and aspirates are unacceptable for Xpert Xpress SARS-CoV-2/FLU/RSV testing.  Fact Sheet for Patients: BloggerCourse.comhttps://www.fda.gov/media/152166/download  Fact Sheet for Healthcare Providers: SeriousBroker.ithttps://www.fda.gov/media/152162/download  This test is not yet approved or cleared by the [place] FDA and has been authorized for detection and/or diagnosis of SARS-CoV-2 by FDA under an Emergency Use Authorization (EUA). This EUA will remain in effect (meaning this test can be used) for the duration of the COVID-19 declaration under Section 564(b)(1) of the Act, 21 U.S.C. section 360bbb-3(b)(1), unless the authorization is terminated or revoked.  Performed at [facility], [address] [place], [place] [address]   MRSA PCR Screening     Status: None   Collection Time: 07/28/20  1:33 PM   Specimen: Nasal Mucosa; Nasopharyngeal  Result Value Ref Range Status   MRSA by PCR NEGATIVE NEGATIVE Final    Comment:        The GeneXpert MRSA Assay (FDA approved for NASAL specimens only), is one component of Fumie Fiallo comprehensive MRSA colonization surveillance program. It is not intended to diagnose  MRSA infection nor to guide or monitor treatment for MRSA infections. Performed at [facility], [address] [place], [place] [address]   CSF culture     Status: None   Collection Time: 07/28/20  2:29 PM   Specimen: CSF; Cerebrospinal Fluid  Result Value Ref Range Status   Specimen Description CSF  Final   Special Requests NONE  Final   Gram Stain  Final    WBC PRESENT, PREDOMINANTLY MONONUCLEAR NO ORGANISMS SEEN CYTOSPIN SMEAR    Culture   Final    NO GROWTH 3 DAYS Performed at [facility], [address], [place], [place] [address]    Report Status 08/01/2020 FINAL  Final  Culture, Urine     Status: Abnormal (Preliminary result)   Collection Time: 07/30/20  6:35 PM   Specimen: Urine, Random  Result Value Ref Range Status   Specimen Description URINE, RANDOM  Final   Special Requests NONE  Final   Culture (Genevie Elman)  Final    >=100,000 COLONIES/mL GRAM NEGATIVE RODS SUSCEPTIBILITIES TO FOLLOW Performed at [facility], [address], [place], [place] [address]    Report Status PENDING  Incomplete         Radiology Studies: No results found.      Scheduled Meds: . benazepril  10 mg Oral Daily  . Chlorhexidine Gluconate Cloth  6 each Topical Daily  . finasteride  5 mg Oral Daily  . latanoprost  1 drop Both Eyes QHS  . LORazepam  2 mg Oral BID   Or  . LORazepam  2 mg Intravenous BID  . temazepam  30 mg Oral QHS  . terazosin  1 mg Oral QHS  . timolol  1 drop Both Eyes BID  . vitamin B-12  1,000 mcg Oral Daily   Continuous Infusions: . cefTRIAXone (ROCEPHIN)  IV 1 g (07/31/20 1808)     LOS: 6 days    Time spent: over 30 min    Lacretia Nicks, MD [facility]   To contact the attending provider between 7A-7P or the covering provider during after hours 7P-7A, please log into the web site [email or web address] and access using universal [place] password for that web site. If you do not have the password, please call the hospital  operator.  08/01/2020, 11:19 AM

## 2020-08-02 ENCOUNTER — Inpatient Hospital Stay (HOSPITAL_COMMUNITY): Payer: Medicare Other

## 2020-08-02 LAB — COMPREHENSIVE METABOLIC PANEL
ALT: 25 U/L (ref 0–44)
AST: 19 U/L (ref 15–41)
Albumin: 2.5 g/dL — ABNORMAL LOW (ref 3.5–5.0)
Alkaline Phosphatase: 83 U/L (ref 38–126)
Anion gap: 7 (ref 5–15)
BUN: 15 mg/dL (ref 8–23)
CO2: 25 mmol/L (ref 22–32)
Calcium: 8.7 mg/dL — ABNORMAL LOW (ref 8.9–10.3)
Chloride: 103 mmol/L (ref 98–111)
Creatinine, Ser: 0.78 mg/dL (ref 0.61–1.24)
GFR, Estimated: >60 mL/min (ref >60)
Glucose, Bld: 150 mg/dL — ABNORMAL HIGH (ref 70–99)
Potassium: 4 mmol/L (ref 3.5–5.1)
Sodium: 135 mmol/L (ref 135–145)
Total Bilirubin: 0.3 mg/dL (ref 0.3–1.2)
Total Protein: 5.3 g/dL — ABNORMAL LOW (ref 6.5–8.1)

## 2020-08-02 LAB — CBC WITH DIFFERENTIAL/PLATELET
Abs Immature Granulocytes: 0.02 10*3/uL (ref 0.00–0.07)
Basophils Absolute: 0.1 10*3/uL (ref 0.0–0.1)
Basophils Relative: 1 %
Eosinophils Absolute: 0.5 10*3/uL (ref 0.0–0.5)
Eosinophils Relative: 7 %
HCT: 30.6 % — ABNORMAL LOW (ref 39.0–52.0)
Hemoglobin: 10.4 g/dL — ABNORMAL LOW (ref 13.0–17.0)
Immature Granulocytes: 0 %
Lymphocytes Relative: 34 %
Lymphs Abs: 2.4 10*3/uL (ref 0.7–4.0)
MCH: 31.9 pg (ref 26.0–34.0)
MCHC: 34 g/dL (ref 30.0–36.0)
MCV: 93.9 fL (ref 80.0–100.0)
Monocytes Absolute: 0.6 10*3/uL (ref 0.1–1.0)
Monocytes Relative: 9 %
Neutro Abs: 3.4 10*3/uL (ref 1.7–7.7)
Neutrophils Relative %: 49 %
Platelets: 244 10*3/uL (ref 150–400)
RBC: 3.26 MIL/uL — ABNORMAL LOW (ref 4.22–5.81)
RDW: 12.3 % (ref 11.5–15.5)
WBC: 7 10*3/uL (ref 4.0–10.5)
nRBC: 0 % (ref 0.0–0.2)

## 2020-08-02 LAB — GLUCOSE, CAPILLARY
Glucose-Capillary: 128 mg/dL — ABNORMAL HIGH (ref 70–99)
Glucose-Capillary: 137 mg/dL — ABNORMAL HIGH (ref 70–99)
Glucose-Capillary: 141 mg/dL — ABNORMAL HIGH (ref 70–99)
Glucose-Capillary: 164 mg/dL — ABNORMAL HIGH (ref 70–99)
Glucose-Capillary: 169 mg/dL — ABNORMAL HIGH (ref 70–99)
Glucose-Capillary: 219 mg/dL — ABNORMAL HIGH (ref 70–99)

## 2020-08-02 LAB — HSV 1/2 AB IGG/IGM CSF
HSV 1/2 Ab Screen IgG, CSF: 2.24 IV — ABNORMAL HIGH (ref <=0.89)
HSV 1/2 Ab, IgM, CSF: 0.24 IV (ref <=0.89)

## 2020-08-02 LAB — HSV TYPE 1 AB, IGG, CSF (REFLEXED): HSV Type 1 Ab, IgG, CSF (Reflexed): 0.12 IV (ref <=0.89)

## 2020-08-02 MED ORDER — POLYETHYLENE GLYCOL 3350 17 G PO PACK
17.0000 g | PACK | Freq: Two times a day (BID) | ORAL | Status: DC
  Administered 2020-08-02: 17 g via ORAL
  Filled 2020-08-02 (×2): qty 1

## 2020-08-02 MED ORDER — BISACODYL 10 MG RE SUPP
10.0000 mg | Freq: Every day | RECTAL | Status: DC | PRN
  Administered 2020-08-03: 10 mg via RECTAL
  Filled 2020-08-02: qty 1

## 2020-08-02 MED ORDER — SENNOSIDES-DOCUSATE SODIUM 8.6-50 MG PO TABS
2.0000 | ORAL_TABLET | Freq: Every day | ORAL | Status: DC
  Administered 2020-08-02: 2 via ORAL
  Filled 2020-08-02: qty 2

## 2020-08-02 NOTE — Progress Notes (Signed)
  Speech Language Pathology Treatment: Dysphagia  Patient Details Name: Russell Ayers MRN: [id number] DOB: 04-01-1933 Today's Date: 08/02/2020 Time: 7614-7092 SLP Time Calculation (min) (ACUTE ONLY): 12 min  Assessment / Plan / Recommendation Clinical Impression  Pt presents with improved mentation, leading to improved efficiency of mastication, better ability to self-feed and orally manipulate crackers.  Consumed several crackers with intermittent sips of water with no further concerns for dysphagia.  Breath sounds are clear; no concerns for aspiration and mental status allows for him to be cleared for a regular diet.  SLP service to sign off. D/W RN.   HPI HPI: 87 y.o. male who presented to [facility] for abdominal pain found to have SBO. 07/28/20 pt had several seizures with EEG suggestive of moderate diffuse encephalopathy  and stated that the excessive beta activity seen in the background is most likely due to the effect of benzodiazepine and is a benign EEG pattern. PMH significant for DM, anxiety, HTN, inguinal hernia, and insomnia.      SLP Plan  All goals met       Recommendations  Diet recommendations: Regular;Thin liquid Liquids provided via: Straw;Cup Medication Administration: Whole meds with puree Supervision: Patient able to self feed;Intermittent supervision to cue for compensatory strategies Postural Changes and/or Swallow Maneuvers: Out of bed for meals                Oral Care Recommendations: Oral care BID Follow up Recommendations: 24 hour supervision/assistance SLP Visit Diagnosis: Dysphagia, unspecified (R13.10) Plan: All goals met       GO                Juan Quam Laurice 08/02/2020, 4:18 PM  [place]earstyn Avitia L. Tivis Ringer, [place] Office number [phone] Pager [phone]

## 2020-08-02 NOTE — Progress Notes (Signed)
Physical Therapy Treatment Patient Details Name: Russell Ayers MRN: [id number] DOB: 09-03-1932 Today's Date: 08/02/2020    History of Present Illness Patient is an 87 y.o. male who presented to [facility] for abdominal pain found to have SBO. 07/28/20 pt had several seizures with EEG suggestive of moderate diffuse encephalopathy  and stated that the excessive beta activity seen in the background is most likely due to the effect of benzodiazepine and is a benign EEG pattern. PMH significant for DM, anxiety, HTN, inguinal hernia, and insomnia.    PT Comments    Pt progressing towards physical therapy goals. Was able to perform transfers and ambulation with up to mod assist and RW for support. Pt with very slow gait but overall good rehab effort this date. Pt anticipates d/c to SNF soon. Will continue to follow.   Follow Up Recommendations  SNF;Supervision/Assistance - 24 hour     Equipment Recommendations  Rolling walker with 5" wheels    Recommendations for Other Services       Precautions / Restrictions Precautions Precautions: Fall Precaution Comments: seizures Restrictions Weight Bearing Restrictions: No    Mobility  Bed Mobility Overal bed mobility: Needs Assistance Bed Mobility: Supine to Sit;Rolling Rolling: Min assist   Supine to sit: Min assist     General bed mobility comments: Assist for rolling to don adult diaper and get tabs velcroed. Assist for trunk elevation to full sitting position. VC's for sequencing.    Transfers Overall transfer level: Needs assistance Equipment used: Rolling walker (2 wheeled) Transfers: Sit to/from Stand Sit to Stand: Mod assist;From elevated surface         General transfer comment: Assist for power-up to full stand as well as to gain/maintain balance. Posterior lean initially and able to gain balance with anterior lean to neutral.  Ambulation/Gait Ambulation/Gait assistance: Min assist;Mod assist Gait Distance (Feet): 80  Feet Assistive device: Rolling walker (2 wheeled) Gait Pattern/deviations: Step-through pattern;Shuffle;Trunk flexed Gait velocity: Decreased Gait velocity interpretation: <1.31 ft/sec, indicative of household ambulator General Gait Details: Assist for balance and walker management. VC's for improved posture and general safety. Pt ambulated ~80 feet total.   Stairs             Wheelchair Mobility    Modified Rankin (Stroke Patients Only)       Balance Overall balance assessment: Needs assistance Sitting-balance support: Bilateral upper extremity supported;Feet supported Sitting balance-Leahy Scale: Poor Sitting balance - Comments: UE support static sitting EOB with min guard for safety.   Standing balance support: During functional activity;Bilateral upper extremity supported Standing balance-Leahy Scale: Poor Standing balance comment: reliant on bil UE (A)                            Cognition Arousal/Alertness: Awake/alert Behavior During Therapy: Flat affect Overall Cognitive Status: Impaired/Different from baseline Area of Impairment: Memory;Awareness;Following commands;Problem solving                   Current Attention Level: Sustained Memory: Decreased recall of precautions;Decreased short-term memory Following Commands: Follows one step commands consistently;Follows one step commands with increased time Safety/Judgement: Decreased awareness of deficits Awareness: Emergent Problem Solving: Decreased initiation;Difficulty sequencing;Requires verbal cues;Requires tactile cues;Slow processing        Exercises      General Comments        Pertinent Vitals/Pain Pain Assessment: Faces Faces Pain Scale: No hurt Pain Intervention(s): Monitored during session    Home Living  Prior Function            PT Goals (current goals can now be found in the care plan section) Acute Rehab PT Goals Patient Stated  Goal: agreeable to session, none stated PT Goal Formulation: With patient Time For Goal Achievement: 08/02/20 Potential to Achieve Goals: Good    Frequency    Min 3X/week      PT Plan Current plan remains appropriate    Co-evaluation              AM-PAC PT "6 Clicks" Mobility   Outcome Measure  Help needed turning from your back to your side while in a flat bed without using bedrails?: A Little Help needed moving from lying on your back to sitting on the side of a flat bed without using bedrails?: A Little Help needed moving to and from a bed to a chair (including a wheelchair)?: A Lot Help needed standing up from a chair using your arms (e.g., wheelchair or bedside chair)?: A Little Help needed to walk in hospital room?: A Lot Help needed climbing 3-5 steps with a railing? : A Lot 6 Click Score: 15    End of Session Equipment Utilized During Treatment: Gait belt Activity Tolerance: Patient tolerated treatment well Patient left: with call bell/phone within reach;in bed;with bed alarm set Nurse Communication: Mobility status PT Visit Diagnosis: Unsteadiness on feet (R26.81);Muscle weakness (generalized) (M62.81);Pain;Other abnormalities of gait and mobility (R26.89);Difficulty in walking, not elsewhere classified (R26.2) Pain - Right/Left: Left Pain - part of body:  (abdomen)     Time: 2706-2376 PT Time Calculation (min) (ACUTE ONLY): 24 min  Charges:  $Gait Training: 23-37 mins                     Conni Slipper, PT, DPT Acute Rehabilitation Services Pager: [phone] Office: [phone]    Marylynn Pearson 08/02/2020, 2:02 PM

## 2020-08-02 NOTE — Progress Notes (Addendum)
PROGRESS NOTE    Russell Ayers  ZOX:[id number] DOB: 09/10/1932 DOA: 07/25/2020 PCP: Soundra PilonBrake, Andrew R, FNP   Chief Complaint  Patient presents with  . Abdominal Pain    Brief Narrative: Patient is an 87 year old Caucasian male with a past medical history significant for but not limited to hypertension and prediabetes as well as other comorbidities who presented to the ED with complaints of abdominal pain.  A CT scan of the abdomen pelvis revealed small bowel obstruction and general surgery was consulted.  Patient was hospitalized for further management and his small bowel obstruction appears to have improved.  On 3/8, the patient started becoming confused and was unable to tell the physician if he had any episodes of vomiting.  The patient was found to be actively seizing on 3/9.  He was transferred to the ICU for LTM and seizure management.  Seizures were thought to be 2/2 benzodiazepine withdrawal.  EEG were negative for status or additional seizures.     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 -new onset seizure 3/9 - pt takes 2 mg BID of ativan at home, was on prn dosing 0.5 and received it 1 x since admission  - EEG 3/10 with no seizures or epileptiform discharges - EEG 3/9 with moderate diffuse encephalopathy, nonspecific - LP negative for acute meningitis/encephalitis  - resume home benzo dose - MRI brain without acute intracranial abnormality  - neurology c/s, appreciate recs - continue ativan 2 mg BID and temazepam 30 mg qHS - no indication for long term AEDs  Acute Metabolic Encephalopathy - likely 2/2 benzo withdrawal - improved - delirium precautions - follow TSH  Low normal b12  - follow mma  - follow folate  UTI - currently on ceftriaxone - follow pending culture results (proteus and  >100,000 gram negative rods - follow) d/c also pending urine cx final results  Generalized Weakness  Deconditioning - suspect related to acute hospitalization, continue therapy - plan for SNF at discharge - was living in ILF at baseline, driving, buying groceries, walking with cane  Small bowel obstruction -CT scan raised concern for transition point in the terminal ileum. Concern raised for inflammatory changes in the distal ileum - KUB 3/8 with improved small bowel distension, improving SBO - surgery appears to have signed off on 3/9 - passing gas, last BM 3/11 - he notes concern for no BM today, ?passing gas - plain film with scattered stool throughout colon - will ensure passing stool prior to d/c given presentation with SBO  Left-sided inguinal hernia -Apparently has seen general surgery in the outpatient setting for same. No evidence for bowel incarceration at this time.  Essential Hypertension - continue benazepril - HCTZ appears to be on hold - follow   Hyponatremia and hypokalemia Continue to monitor   Normocytic Anemia -continue to monitor   BPH -Continue with finasteride 5 mg p.o. daily as well Terazosin 1 mg p.o. nightly  History of Glaucoma Continue with eyedrops with Timolol 1 drop and Latanoprost 0.005% 1 drop Both Eyes qHS  Hypophosphatemia -ctm   DVT prophylaxis: SCD Code Status: full  Family Communication: none at bedsdie Disposition:   Status is: Inpatient  Remains inpatient appropriate because:Inpatient level of care appropriate due to severity of illness   Dispo: The patient is from: Home  Anticipated d/c is to: SNF              Patient currently is not medically stable to d/c.   Difficult to place patient No  Consultants:   Neurology  Surgery  PCCM  Procedures: EEG IMPRESSION: This study is suggestive of moderate diffuse encephalopathy, nonspecific etiology. No seizures or epileptiform discharges were seen  throughout the recording. The excessive beta activity seen in the background is most likely due to the effect of benzodiazepine and is a benign EEG pattern.   Antimicrobials:  Anti-infectives (From admission, onward)   Start     Dose/Rate Route Frequency Ordered Stop   07/30/20 1845  cefTRIAXone (ROCEPHIN) 1 g in sodium chloride 0.9 % 100 mL IVPB        1 g 200 mL/hr over 30 Minutes Intravenous Every 24 hours 07/30/20 1748       Subjective: Notes no BM for several days Passing some BM, but doesn't report thsi confidently   Objective: Vitals:   08/02/20 0340 08/02/20 0741 08/02/20 1204 08/02/20 1608  BP: 140/77 (!) 145/58 (!) 173/61 (!) 155/76  Pulse: 79 70 60 (!) 44  Resp: Temp: 97.7 F (36.5 C) 98.4 F (36.9 C) 97.6 F (36.4 C) 97.8 F (36.6 C)  TempSrc: Oral  Oral   SpO2: 96% 96% 99% 100%  Weight:      Height:        Intake/Output Summary (Last 24 hours) at 08/02/2020 1957 Last data filed at 08/02/2020 1800 Gross per 24 hour  Intake -  Output 1850 ml  Net -1850 ml   Filed Weights   07/25/20 2113  Weight: 75.3 kg    Examination:  General: No acute distress. Cardiovascular: Heart sounds show a regular rate, and rhythm.  Lungs: Clear to auscultation bilaterally with good air movement. No rales, rhonchi or wheezes. Abdomen: Soft, mildly distended, nontender - quiet bowel sounds Neurological: Alert and oriented 3. Moves all extremities 4 with equal strength. Cranial nerves II through XII grossly intact. Skin: Warm and dry. No rashes or lesions. Extremities: No clubbing or cyanosis. No edema.     Data Reviewed: I have personally reviewed following labs and imaging studies  CBC: Recent Labs  Lab 07/29/20 0234 07/30/20 0046 07/31/20 0228 08/01/20 0413 08/02/20 0236  WBC 8.4 9.4 8.6 6.8 7.0  NEUTROABS 5.7 6.9 5.3 3.7 3.4  HGB 11.1* 11.3* 10.4* 10.1* 10.4*  HCT 31.6* 32.0* 29.9* 28.9* 30.6*  MCV 92.9 93.6 94.6 93.8 93.9  PLT 231 286 218 221  244    Basic Metabolic Panel: Recent Labs  Lab 07/27/20 0510 07/28/20 0437 07/28/20 1318 07/29/20 0234 07/30/20 0046 07/31/20 0228 08/01/20 0413 08/02/20 0236  NA 134* 133* 134* 135 135 135 132* 135  K 3.7 3.6 3.6 3.6 3.4* 4.1 4.1 4.0  CL 104 104 101 102 104 105 102 103  CO2 21* 21* GLUCOSE 136* 131* 127* 127* 142* 132* 151* 150*  BUN 7* 7* 6* 5* CREATININE 0.65 0.69 0.82 0.79 0.80 0.90 0.76 0.78  CALCIUM 8.8* 8.7* 9.0 8.8* 8.6* 8.5* 8.2* 8.7*  MG 1.9 1.8 2.0  --   --   --   --   --   PHOS  --  1.9* 2.4*  --   --   --   --   --     GFR: Estimated Creatinine Clearance: 70.6 mL/min (by C-G formula  based on SCr of 0.78 mg/dL).  Liver Function Tests: Recent Labs  Lab 07/29/20 0234 07/30/20 0046 07/31/20 0228 08/01/20 0413 08/02/20 0236  AST 30 28 38 26 19  ALT 17 14 19 24 25   ALKPHOS 51 62 71 85 83  BILITOT 0.6 0.5 1.4* 0.6 0.3  PROT 6.0* 5.8* 5.3* 5.2* 5.3*  ALBUMIN 3.1* 2.9* 2.5* 2.4* 2.5*    CBG: Recent Labs  Lab 08/02/20 0033 08/02/20 0445 08/02/20 0748 08/02/20 1203 08/02/20 1607  GLUCAP 164* 141* 137* 219* 128*     Recent Results (from the past 240 hour(s))  Resp Panel by RT-PCR (Flu A&B, Covid) Nasopharyngeal Swab     Status: None   Collection Time: 07/26/20  2:07 AM   Specimen: Nasopharyngeal Swab; Nasopharyngeal(NP) swabs in vial transport medium  Result Value Ref Range Status   SARS Coronavirus 2 by RT PCR NEGATIVE NEGATIVE Final    Comment: (NOTE) SARS-CoV-2 target nucleic acids are NOT DETECTED.  The SARS-CoV-2 RNA is generally detectable in upper respiratory specimens during the acute phase of infection. The lowest concentration of SARS-CoV-2 viral copies this assay can detect is 138 copies/mL. A negative result does not preclude SARS-Cov-2 infection and should not be used as the sole basis for treatment or other patient management decisions. A negative result may occur with  improper specimen  collection/handling, submission of specimen other than nasopharyngeal swab, presence of viral mutation(s) within the areas targeted by this assay, and inadequate number of viral copies(<138 copies/mL). A negative result must be combined with clinical observations, patient history, and epidemiological information. The expected result is Negative.  Fact Sheet for Patients:  09/25/20  Fact Sheet for Healthcare Providers:  BloggerCourse.com  This test is no t yet approved or cleared by the [place] FDA and  has been authorized for detection and/or diagnosis of SARS-CoV-2 by FDA under an Emergency Use Authorization (EUA). This EUA will remain  in effect (meaning this test can be used) for the duration of the COVID-19 declaration under Section 564(b)(1) of the Act, 21 U.S.C.section 360bbb-3(b)(1), unless the authorization is terminated  or revoked sooner.       Influenza A by PCR NEGATIVE NEGATIVE Final   Influenza B by PCR NEGATIVE NEGATIVE Final    Comment: (NOTE) The Xpert Xpress SARS-CoV-2/FLU/RSV plus assay is intended as an aid in the diagnosis of influenza from Nasopharyngeal swab specimens and should not be used as a sole basis for treatment. Nasal washings and aspirates are unacceptable for Xpert Xpress SARS-CoV-2/FLU/RSV testing.  Fact Sheet for Patients: [place]  Fact Sheet for Healthcare Providers: BloggerCourse.com  This test is not yet approved or cleared by the [place] FDA and has been authorized for detection and/or diagnosis of SARS-CoV-2 by FDA under an Emergency Use Authorization (EUA). This EUA will remain in effect (meaning this test can be used) for the duration of the COVID-19 declaration under Section 564(b)(1) of the Act, 21 U.S.C. section 360bbb-3(b)(1), unless the authorization is terminated or revoked.  Performed at [facility], [address] [place], [place]   MRSA PCR Screening     Status: None   Collection Time: 07/28/20  1:33 PM   Specimen: Nasal Mucosa; Nasopharyngeal  Result Value Ref Range Status   MRSA by PCR NEGATIVE NEGATIVE Final    Comment:        The GeneXpert MRSA Assay (FDA approved for NASAL specimens only), is one component of a comprehensive MRSA colonization surveillance program. It is  not intended to diagnose MRSA infection nor to guide or monitor treatment for MRSA infections. Performed at [facility], [address] [place], [place] [address]   HSV 1/2 Ab IgG/IgM CSF     Status: Abnormal   Collection Time: 07/28/20  2:28 PM   Specimen: Cerebrospinal Fluid  Result Value Ref Range Status   HSV 1/2 Ab, IgM, CSF 0.24 <=0.89 IV Final    Comment: (NOTE) INTERPRETIVE INFORMATION: Herpes Simplex Virus                          Type 1 and/or 2 Antibodies,                          IgM by ELISA, CSF  0.89 IV or Less .......... Negative: No significant                             level of detectable HSV IgM                             antibody.  0.90 - 1.09 IV ........... Equivocal: Questionable                             presence of IgM antibodies.                             Repeat testing in 10-14 days                             may be helpful.  1.10 IV or Greater ....... Positive: IgM antibody to HSV                             detected, which may indicate a                             current or recent infection.                             However, low levels of IgM                             antibodies may occasionally                             persist for more than 12                             months post-infection. The detection of antibodies to herpes simplex virus in CSF may indicate central nervou s system infection. However, consideration must be given to possible contamination by blood or transfer of serum antibodies  across the blood-brain barrier. Fourfold or greater rise in CSF antibodies to herpes on specimens at least 4 weeks apart are found in 74-94 % of patients with herpes encephalitis. Specificity of the test based on a single CSF testing is not established. Presently PCR is the primary means  of establishing a diagnosis of herpes encephalitis. This test was developed and its performance characteristics determined by [employer]. It has not been cleared or approved by the [place] Food and Drug Administration. This test was performed in a CLIA certified laboratory and is intended for clinical purposes.    HSV 1/2 Ab Screen IgG, CSF 2.24 (H) <=0.89 IV Final    Comment: (NOTE) INTERPRETIVE INFORMATION: Herpes Simplex Virus Type 1 and/or 2                    Antibodies, IgG CSF  0.89 IV or Less .......... Negative: No significant                             level of detectable HSV IgG                             antibody.  0.90 - 1.09 IV ........... Equivocal: Questionable                             presence of IgG antibodies.                             Repeat testing in 10-14 days                             may be helpful.  1.10 IV or Greater ....... Positive: IgG antibody to HSV                             detected, which may indicate                             a current or past HSV                             infection. The detection of antibodies to herpes simplex virus in CSF may indicate central nervous system infection. However, consideration must be given to possible contamination by blood or transfer of serum antibodies across the blood-brain barrier. Fourfold or greater rise in CSF antibodies to herpes on specimens at l east 4 weeks apart are found in 74-94 % of patients with herpes encephalitis. Specificity of the test based on a single CSF testing is not established. Presently PCR is the primary means of establishing a diagnosis of herpes encephalitis. This test was  developed and its performance characteristics determined by [employer]. It has not been cleared or approved by the [place] Food and Drug Administration. This test was performed in a CLIA certified laboratory and is intended for clinical purposes. Performed At: [facility] [address] [place], [place] [id number] Elinor Parkinson I MD [phone]   HSV Type 1 Ab, IgG, CSF (Reflexed)     Status: None   Collection Time: 07/28/20  2:28 PM  Result Value Ref Range Status   HSV Type 1 Ab, IgG, CSF (Reflexed) 0.12 <=0.89 IV Final    Comment: (NOTE) INTERPRETIVE INFORMATION: Herpes Simplex Virus Type 1  Glycoprotein G-Specific Antibody,                          IgG by ELISA, CSF  0.89 IV or Less ...... Negative: No significant level of                         detectable IgG antibody to HSV                         type 1 glycoprotein G.  0.90 - 1.10 IV ....... Equivocal: Questionable presence                         of IgG antibody to HSV type 1.                         Repeat testing in 10-14 days may                         be helpful.  1.11 IV or Greater ... Positive: IgG antibody to HSV                         type 1 glycoprotein G detected,                         which may indicate a current                         or past infection. Individuals infected with HSV may not exhibit detectable IgG antibody to type specific HSV antigens 1 and 2 in the early stages of infection. Detection of antibody presence in these cases may only be possible using a nontype-specific screening test . The detection of antibodies to herpes simplex virus in CSF may indicate central nervous system infection. However, consideration must be given to possible contamination by blood or transfer of serum antibodies across the blood-brain barrier. Fourfold or greater rise in CSF antibodies to herpes on specimens at least 4 weeks apart are found in 74-94 percent of patients  with herpes encephalitis. Specificity of the test based on a single CSF testing is not established. Presently PCR is the primary means of establishing a diagnosis of herpes encephalitis. This test was developed and its performance characteristics determined by [employer]. It has not been cleared or approved by the [place] Food and Drug Administration. This test was performed in a CLIA certified laboratory and is intended for clinical purposes. Performed At: [facility] [address] [place], [place] [id number] Elinor Parkinson I MD NW:[id number]   CSF culture     Status: None   Collection Time: 07/28/20  2:29 PM   Specimen: CSF; Cerebrospinal Fluid  Result Value Ref Range Status   Specimen Description CSF  Final   Special Requests NONE  Final   Gram Stain   Final    WBC PRESENT, PREDOMINANTLY MONONUCLEAR NO ORGANISMS SEEN CYTOSPIN SMEAR    Culture   Final    NO GROWTH 3 DAYS Performed at [facility], [address] [place], [place] [address]    Report Status 08/01/2020 FINAL  Final  Culture, Urine     Status: Abnormal (Preliminary result)   Collection Time: 07/30/20  6:35 PM  Specimen: Urine, Random  Result Value Ref Range Status   Specimen Description URINE, RANDOM  Final   Special Requests NONE  Final   Culture (A)  Final    >=100,000 COLONIES/mL PROTEUS MIRABILIS >=100,000 COLONIES/mL GRAM NEGATIVE RODS CULTURE REINCUBATED FOR BETTER GROWTH Performed at [facility], [address], [place], [place] [address]    Report Status PENDING  Incomplete   Organism ID, Bacteria PROTEUS MIRABILIS (A)  Final      Susceptibility   Proteus mirabilis - MIC*    AMPICILLIN <=2 SENSITIVE Sensitive     CEFAZOLIN <=4 SENSITIVE Sensitive     CEFEPIME <=0.12 SENSITIVE Sensitive     CEFTRIAXONE <=0.25 SENSITIVE Sensitive     CIPROFLOXACIN <=0.25 SENSITIVE Sensitive     GENTAMICIN <=1 SENSITIVE Sensitive     IMIPENEM 2 SENSITIVE Sensitive      NITROFURANTOIN 128 RESISTANT Resistant     TRIMETH/SULFA <=20 SENSITIVE Sensitive     AMPICILLIN/SULBACTAM <=2 SENSITIVE Sensitive     PIP/TAZO <=4 SENSITIVE Sensitive     * >=100,000 COLONIES/mL PROTEUS MIRABILIS  SARS CORONAVIRUS 2 (TAT 6-24 HRS) Nasopharyngeal Nasopharyngeal Swab     Status: None   Collection Time: 08/01/20 11:35 AM   Specimen: Nasopharyngeal Swab  Result Value Ref Range Status   SARS Coronavirus 2 NEGATIVE NEGATIVE Final    Comment: (NOTE) SARS-CoV-2 target nucleic acids are NOT DETECTED.  The SARS-CoV-2 RNA is generally detectable in upper and lower respiratory specimens during the acute phase of infection. Negative results do not preclude SARS-CoV-2 infection, do not rule out co-infections with other pathogens, and should not be used as the sole basis for treatment or other patient management decisions. Negative results must be combined with clinical observations, patient history, and epidemiological information. The expected result is Negative.  Fact Sheet for Patients: HairSlick.no  Fact Sheet for Healthcare Providers: quierodirigir.com  This test is not yet approved or cleared by the [place] FDA and  has been authorized for detection and/or diagnosis of SARS-CoV-2 by FDA under an Emergency Use Authorization (EUA). This EUA will remain  in effect (meaning this test can be used) for the duration of the COVID-19 declaration under Se ction 564(b)(1) of the Act, 21 U.S.C. section 360bbb-3(b)(1), unless the authorization is terminated or revoked sooner.  Performed at [facility], [address], [place], [place] [address]          Radiology Studies: DG Abd 1 View  Result Date: 08/02/2020 CLINICAL DATA:  Constipation. EXAM: ABDOMEN - 1 VIEW COMPARISON:  07/27/2020 FINDINGS: There is scattered stool throughout the colon and down into the rectum but no significant stool burden. No  findings for small bowel obstruction or free air. The soft tissue shadows are maintained. No worrisome calcifications. The bony structures are unremarkable. Stable scoliosis and degenerative lumbar spondylosis. IMPRESSION: Scattered stool throughout the colon but no significant stool burden. Electronically Signed   By: Rudie Meyer M.D.   On: 08/02/2020 12:48        Scheduled Meds: . benazepril  10 mg Oral Daily  . Chlorhexidine Gluconate Cloth  6 each Topical Daily  . finasteride  5 mg Oral Daily  . latanoprost  1 drop Both Eyes QHS  . LORazepam  2 mg Oral BID   Or  . LORazepam  2 mg Intravenous BID  . polyethylene glycol  17 g Oral BID  . senna-docusate  2 tablet Oral QHS  . temazepam  30 mg Oral QHS  .  terazosin  1 mg Oral QHS  . timolol  1 drop Both Eyes BID  . vitamin B-12  1,000 mcg Oral Daily   Continuous Infusions: . cefTRIAXone (ROCEPHIN)  IV 1 g (08/02/20 1827)     LOS: 7 days    Time spent: over 30 min    Lacretia Nicks, MD [facility]   To contact the attending provider between 7A-7P or the covering provider during after hours 7P-7A, please log into the web site [email or web address] and access using universal [place] password for that web site. If you do not have the password, please call the hospital operator.  08/02/2020, 7:57 PM

## 2020-08-02 NOTE — TOC Progression Note (Signed)
Transition of Care [facility]) - Progression Note    Patient Details  Name: Russell Ayers MRN: [id number] Date of Birth: 08-20-1932  Transition of Care [facility]) CM/SW Contact  Baldemar Lenis, [place] Phone Number: 08/02/2020, 4:03 PM  Clinical Narrative:   CSW following for discharge to SNF. CSW confirmed bed availability at [facility], but patient has not had a bowel movement in several days. Per MD, need to have a bowel movement prior to discharge. Hopeful for discharge to [facility] tomorrow. CSW to follow.    Expected Discharge Plan: Skilled Nursing Facility Barriers to Discharge: Continued Medical Work up  Expected Discharge Plan and Services Expected Discharge Plan: Skilled Nursing Facility In-house Referral: Clinical Social Work Discharge Planning Services: CM Consult Post Acute Care Choice: Durable Medical Equipment Living arrangements for the past 2 months: Independent Living Facility                                       Social Determinants of Health (SDOH) Interventions    Readmission Risk Interventions No flowsheet data found.

## 2020-08-02 NOTE — Care Management Important Message (Signed)
Important Message  Patient Details  Name: Russell Ayers MRN: [id number] Date of Birth: 25-Aug-1932   Medicare Important Message Given:  Yes     Halea Lieb Stefan Church 08/02/2020, 4:22 PM

## 2020-08-02 NOTE — Plan of Care (Signed)
  Problem: Clinical Measurements: Goal: Will remain free from infection Outcome: Progressing   Problem: Education: Goal: Knowledge of General Education information will improve Description: Including pain rating scale, medication(s)/side effects and non-pharmacologic comfort measures Outcome: Progressing   

## 2020-08-03 LAB — GLUCOSE, CAPILLARY
Glucose-Capillary: 143 mg/dL — ABNORMAL HIGH (ref 70–99)
Glucose-Capillary: 146 mg/dL — ABNORMAL HIGH (ref 70–99)
Glucose-Capillary: 269 mg/dL — ABNORMAL HIGH (ref 70–99)

## 2020-08-03 MED ORDER — CEPHALEXIN 500 MG PO CAPS: 500.0000 mg | ORAL_CAPSULE | Freq: Two times a day (BID) | ORAL | 0 refills | Status: AC

## 2020-08-03 MED ORDER — GERHARDT'S BUTT CREAM
TOPICAL_CREAM | CUTANEOUS | Status: DC | PRN
  Administered 2020-08-03: 1 via TOPICAL
  Filled 2020-08-03: qty 1

## 2020-08-03 MED ORDER — CYANOCOBALAMIN 1000 MCG PO TABS: 1000.0000 ug | ORAL_TABLET | Freq: Every day | ORAL | 0 refills | Status: AC

## 2020-08-03 MED ORDER — LORAZEPAM 2 MG PO TABS: 2.0000 mg | ORAL_TABLET | Freq: Two times a day (BID) | ORAL | 0 refills | Status: AC

## 2020-08-03 MED ORDER — TEMAZEPAM 30 MG PO CAPS: 30.0000 mg | ORAL_CAPSULE | Freq: Every day | ORAL | 0 refills | Status: DC

## 2020-08-03 NOTE — TOC Transition Note (Addendum)
Transition of Care [facility]) - CM/SW Discharge Note   Patient Details  Name: Russell Ayers MRN: [id number] Date of Birth: [birth year]  Transition of Care [facility]) CM/SW Contact:  Chana Bode, Student-Social Work Phone Number: 08/03/2020, 11:51 AM   Clinical Narrative:   Nurse to call report to [phone]. Rm. 32    Final next level of care: Skilled Nursing Facility Barriers to Discharge: No Barriers Identified   Patient Goals and CMS Choice Patient states their goals for this hospitalization and ongoing recovery are:: Per daughter, "He is too weak to go back to his independent  living, he needs to go to rehab."      Discharge Placement              Patient chooses bed at: [facility] Patient to be transferred to facility by: [facility] Name of family member notified: Staci Patient and family notified of of transfer: 08/03/20  Discharge Plan and Services In-house Referral: Clinical Social Work Discharge Planning Services: CM Consult Post Acute Care Choice: Durable Medical Equipment                               Social Determinants of Health (SDOH) Interventions     Readmission Risk Interventions No flowsheet data found.

## 2020-08-03 NOTE — Discharge Summary (Addendum)
Physician Discharge Summary  Russell Ayers ZOX:[id number] DOB: 12-Sep-1932 DOA: 07/25/2020  PCP: Soundra Pilon, FNP  Admit date: 07/25/2020 Discharge date: 08/03/2020  Time spent: 40 minutes  Recommendations for Outpatient Follow-up:  1. Follow outpatient CBC/CMP 2. Follow final urine culture - discharged on keflex - follow urine culture for final results, adjust abx as needed  3. Follow with surgery outpatient for inguinal hernia 4. Follow pending MMA - continue b12 supplementation 5. HCTZ on hold at discharge (due to hypokalemia/hyponatremia) - follow for need for resumption   Discharge Diagnoses:  Principal Problem:   SBO (small bowel obstruction) (HCC) Active Problems:   Essential hypertension   Left lower quadrant abdominal pain   Seizure [facility])   Discharge Condition: stable  Diet recommendation: heart healthy  Filed Weights   07/25/20 2113  Weight: 75.3 kg    History of present illness:  Patient is an 87 year old Caucasian male with Russell Ayers past medical history significant for but not limited to hypertension and prediabetes as well as other comorbidities who presented to the ED with complaints of abdominal pain. Karagan Lehr CT scan of the abdomen pelvis revealed small bowel obstruction and general surgery was consulted. Patient was hospitalized for further management and his small bowel obstruction appears to have improved. On 3/8, the patient started becoming confused and was unable to tell the physician if he had any episodes of vomiting.  The patient was found to be actively seizing on 3/9. He was transferred to the ICU for LTM and seizure management.  Seizures were thought to be 2/2 benzodiazepine withdrawal.  EEG were negative for status or additional seizures.  He was transferred back to the hospitalist service on 3/12.  He's been stable, had Minetta Krisher BM overnight 3/14-15.  He was started on abx for UTI, final culture pending at discharge.   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 -new onset seizure 3/9 - pt takes 2 mg BID of ativan at home, was on prn dosing 0.5 and received it 1 x since admission  - EEG 3/10 with no seizures or epileptiform discharges - EEG 3/9 with moderate diffuse encephalopathy, nonspecific - LP negative for acute meningitis/encephalitis  - resume home benzo dose - MRI brain without acute intracranial abnormality  - neurology c/s, appreciate recs - continue ativan 2 mg BID and temazepam 30 mg qHS - no indication for long term AEDs  AcuteMetabolic Encephalopathy - likely 2/2 benzo withdrawal - improved - delirium precautions - follow TSH  Low normal b12  - follow mma  - follow folate (wnl)  UTI - currently on ceftriaxone - follow pending culture results (proteus and >100,000 gram negative rods - follow) follow final culture results after discharge - discharged on keflex, though final culture still pending - needs follow up to ensure adequate treatment  Generalized Weakness  Deconditioning - suspect related to acute hospitalization, continue therapy - plan for SNF at discharge - was living in ILF at baseline, driving, buying groceries, walking with cane  Small bowel obstruction -CT scan raised concern for transition point in the terminal ileum. Concern raised for inflammatory changes in the distal ileum - KUB 3/8 with improved small bowel distension, improving SBO - surgery appears to have signed off on 3/9 - passing gas, last BM 3/14-15 PM - follow with surgery outpatient   Left-sided inguinal hernia -Apparently has seen general surgery in the outpatient setting for same. No evidence for bowel incarceration at this time. - follow with  surgery outpatient  EssentialHypertension - continue benazepril - HCTZ discontinued - follow   Hyponatremia and hypokalemia Continue to monitor  HCTZ on hold  NormocyticAnemia -continue to monitor   BPH -Continue with  finasteride 5 mg p.o. daily as well Terazosin 1 mg p.o. nightly  History ofGlaucoma Continue with eyedropswith Timolol 1 drop and Latanoprost 0.005% 1 drop Both Eyes qHS  Hypophosphatemia -ctm   Procedures: Overnight EEG IMPRESSION: This study is within normal limits.  No seizures or epileptiform discharges were seen throughout the recording. The excessive beta activity seen in the background is most likely due to the effect of benzodiazepine and is Sharmane Dame benign EEG pattern.  EEG IMPRESSION: This study is suggestive of moderate diffuse encephalopathy, nonspecific etiology. No seizures or epileptiform discharges were seen throughout the recording. The excessive beta activity seen in the background is most likely due to the effect of benzodiazepine and is Maecie Sevcik benign EEG pattern. Consultations:  Neurology  Surgery  PCCM  Discharge Exam: Vitals:   08/03/20 0345 08/03/20 0759  BP: (!) 100/48 (!) 125/52  Pulse: 65 65  Resp: 19 18  Temp: (!) 97.3 F (36.3 C) 98.2 F (36.8 C)  SpO2: 94% 98%   Feeling well, ready for discharge Called daughter, no answer General: No acute distress. Cardiovascular: Heart sounds show Graciana Sessa regular rate, and rhythm Lungs: Clear to auscultation bilaterally  Abdomen: Soft, nontender, nondistended Neurological: Alert and oriented 3. Moves all extremities 4 . Cranial nerves II through XII grossly intact. Skin: Warm and dry. No rashes or lesions. Extremities: No clubbing or cyanosis. No edema.  Discharge Instructions   Discharge Instructions    Call MD for:  difficulty breathing, headache or visual disturbances   Complete by: As directed    Call MD for:  extreme fatigue   Complete by: As directed    Call MD for:  hives   Complete by: As directed    Call MD for:  persistant dizziness or light-headedness   Complete by: As directed    Call MD for:  persistant nausea and vomiting   Complete by: As directed    Call MD for:  redness, tenderness, or  signs of infection (pain, swelling, redness, odor or green/yellow discharge around incision site)   Complete by: As directed    Call MD for:  severe uncontrolled pain   Complete by: As directed    Call MD for:  temperature >100.4   Complete by: As directed    Diet - low sodium heart healthy   Complete by: As directed    Discharge instructions   Complete by: As directed    You were seen for Gaelen Brager small bowel obstruction.  This has now resolved.  Follow up with surgery outpatient for your hernia and small bowel obstruction.  Your hospital stay was complicated by seizures.  This has resolved after your ativan has been resumed.  We're treating you for Jayleana Colberg UTI.  Your culture is still pending today.  Your SNF provider should follow up the final culture results to ensure your antibiotic is appropriate.  Continue miralax daily.   Return for new, recurrent, or worsening symptoms.  Please ask your PCP to request records from this hospitalization so they know what was done and what the next steps will be.   Increase activity slowly   Complete by: As directed      Allergies as of 08/03/2020   No Known Allergies     Medication List    STOP taking these medications  aspirin EC 81 MG tablet   hydrochlorothiazide 12.5 MG tablet Commonly known as: HYDRODIURIL     TAKE these medications   benazepril 10 MG tablet Commonly known as: LOTENSIN Take 10 mg by mouth daily.   cephALEXin 500 MG capsule Commonly known as: KEFLEX Take 1 capsule (500 mg total) by mouth 2 (two) times daily for 3 days.   cetirizine 10 MG chewable tablet Commonly known as: ZYRTEC Chew 10-20 mg by mouth See admin instructions. 20mg  in the morning  10mg  at noon 10mg  in the afternoon   cyanocobalamin 1000 MCG tablet Take 1 tablet (1,000 mcg total) by mouth daily. Start taking on: August 04, 2020   diphenhydrAMINE 25 MG tablet Commonly known as: BENADRYL Take 25 mg by mouth at bedtime.   finasteride 5 MG  tablet Commonly known as: PROSCAR Take 5 mg by mouth daily.   fluticasone 50 MCG/ACT nasal spray Commonly known as: FLONASE Place 1-2 sprays into both nostrils daily as needed for allergies.   latanoprost 0.005 % ophthalmic solution Commonly known as: XALATAN Place 1 drop into both eyes at bedtime.   LORazepam 2 MG tablet Commonly known as: ATIVAN Take 2 mg by mouth 2 (two) times daily.   Melatonin 10 MG Tabs Take 20-40 mg by mouth See admin instructions. 20mg  every night And 20mg  more if needed for sleep   PSYLLIUM HUSK PO Take 1 tablet by mouth 3 (three) times daily.   SAW PALMETTO PO Take 1 tablet by mouth daily.   temazepam 30 MG capsule Commonly known as: RESTORIL Take 30 mg by mouth at bedtime.   terazosin 1 MG capsule Commonly known as: HYTRIN Take 1 mg by mouth at bedtime.   timolol 0.5 % ophthalmic solution Commonly known as: TIMOPTIC Place 1 drop into both eyes daily.      No Known Allergies  Contact information for after-discharge care    Destination    [facility] Preferred SNF .   Service: Skilled Nursing Contact information: [address] [place] [id number] [phone]                   The results of significant diagnostics from this hospitalization (including imaging, microbiology, ancillary and laboratory) are listed below for reference.    Significant Diagnostic Studies: DG Abd 1 View  Result Date: 08/02/2020 CLINICAL DATA:  Constipation. EXAM: ABDOMEN - 1 VIEW COMPARISON:  07/27/2020 FINDINGS: There is scattered stool throughout the colon and down into the rectum but no significant stool burden. No findings for small bowel obstruction or free air. The soft tissue shadows are maintained. No worrisome calcifications. The bony structures are unremarkable. Stable scoliosis and degenerative lumbar spondylosis. IMPRESSION: Scattered stool throughout the colon but no significant stool  burden. Electronically Signed   By: Rudie MeyerP.  Gallerani M.D.   On: 08/02/2020 12:48   CT HEAD WO CONTRAST  Result Date: 07/27/2020 CLINICAL DATA:  Mental status change EXAM: CT HEAD WITHOUT CONTRAST TECHNIQUE: Contiguous axial images were obtained from the base of the skull through the vertex without intravenous contrast. COMPARISON:  None. FINDINGS: Brain: There is no acute intracranial hemorrhage, mass effect, or edema. Gray-white differentiation is preserved. Prominence of the ventricles and sulci reflects generalized parenchymal volume loss. Patchy hypoattenuation in the supratentorial white matter is nonspecific but probably reflects mild to moderate chronic microvascular ischemic changes. No definite extra-axial collection. Vascular: There is atherosclerotic calcification at the skull base. Skull: Calvarium is unremarkable. Sinuses/Orbits: No acute  finding. Other: None. IMPRESSION: No acute intracranial abnormality. Chronic microvascular ischemic changes. Electronically Signed   By: Guadlupe Spanish M.D.   On: 07/27/2020 17:22   MR BRAIN W WO CONTRAST  Result Date: 07/28/2020 CLINICAL DATA:  Seizure.  Mental status change. EXAM: MRI HEAD WITHOUT AND WITH CONTRAST TECHNIQUE: Multiplanar, multiecho pulse sequences of the brain and surrounding structures were obtained without and with intravenous contrast. CONTRAST:  32mL GADAVIST GADOBUTROL 1 MMOL/ML IV SOLN COMPARISON:  CT head without contrast 07/27/2020 FINDINGS: Brain: Metallic artifact from the oral cavity distorts some imaging of the inferior frontal lobes. Portions of the diffusion and susceptibility images are obscured. No acute infarct, hemorrhage, or mass lesion is present. Moderate atrophy and white matter changes are present bilaterally. The ventricles are proportionate to the degree of atrophy. The basal ganglia are intact. No significant extraaxial fluid collection is present. Insert normal IAC The brainstem and cerebellum are within normal limits.  Postcontrast images demonstrate no pathologic enhancement. Vascular: Flow is present in the major intracranial arteries. Skull and upper cervical spine: Exaggerated cervical lordosis is present. Craniocervical junction is normal. Sinuses/Orbits: The paranasal sinuses and mastoid air cells are clear. Bilateral lens replacements are noted. Globes and orbits are otherwise unremarkable. IMPRESSION: 1. No acute intracranial abnormality. 2. Moderate atrophy and white matter disease likely reflects the sequela of chronic microvascular ischemia. Electronically Signed   By: Marin Roberts M.D.   On: 07/28/2020 11:29   CT ABDOMEN PELVIS W CONTRAST  Result Date: 07/26/2020 CLINICAL DATA:  Abdominal pain for several hours EXAM: CT ABDOMEN AND PELVIS WITH CONTRAST TECHNIQUE: Multidetector CT imaging of the abdomen and pelvis was performed using the standard protocol following bolus administration of intravenous contrast. CONTRAST:  OMNIPAQUE IOHEXOL 300 MG/ML  SOLN COMPARISON:  None. FINDINGS: Lower chest: Lung bases are free of acute infiltrate or sizable effusion. Large hiatal hernia is noted. Hepatobiliary: No focal liver abnormality is seen. No gallstones, gallbladder wall thickening, or biliary dilatation. Pancreas: Unremarkable. No pancreatic ductal dilatation or surrounding inflammatory changes. Spleen: Normal in size without focal abnormality. Adrenals/Urinary Tract: Adrenal glands are within normal limits. Kidneys demonstrate Makailah Slavick normal enhancement pattern bilaterally normal excretion is noted on delayed images. No ureteral stones are seen. The bladder is well distended. Stomach/Bowel: Scattered diverticular change of the colon is noted without evidence of diverticulitis. Gigi Onstad loop of sigmoid colon is noted within Raven Furnas left inguinal hernia without incarceration. The more proximal colon appears within normal limits. The appendix is within normal limits. Small bowel demonstrates multiple dilated loops of jejunum  and ileum with Pebbles Zeiders transition zone in the left lower quadrant although no definitive mass is seen. Some inflammatory changes in the distal ileum are seen which likely contribute to the more proximal obstructive process. The stomach is decompressed. Large hiatal hernia is again noted. Vascular/Lymphatic: Aortic atherosclerosis. No enlarged abdominal or pelvic lymph nodes. Reproductive: Prostate is unremarkable. Other: Left inguinal hernia is noted with fluid and Dayln Tugwell loop of sigmoid colon within. No incarceration is noted. Musculoskeletal: Chronic T12 compression deformity is noted. No acute bony abnormality is seen. IMPRESSION: Multiple dilated loops of small bowel with Azlynn Mitnick transition zone in the left lower quadrant likely secondary to inflammatory changes in the distal ileum. These changes are consistent with Lekita Kerekes partial small bowel obstruction. Diverticulosis without diverticulitis. Large hiatal hernia. Left inguinal hernia with Assyria Morreale loop of sigmoid colon within although no obstructive changes are noted. Electronically Signed   By: Alcide Clever M.D.   On: 07/26/2020 00:57  DG Abd Portable 1V  Result Date: 07/27/2020 CLINICAL DATA:  Small-bowel obstruction. EXAM: PORTABLE ABDOMEN - 1 VIEW COMPARISON:  CT 07/26/2020 FINDINGS: Previously identified prominent small bowel distention has significantly improved. Left inguinal hernia again noted. Colon is not distended. Stool in the colon. No free air. Degenerative changes scoliosis lumbar spine. Degenerative changes both hips. Pelvic calcifications consistent phleboliths. Surgical clips noted the left groin. IMPRESSION: 1. Previously identified prominent small bowel distention has significantly improved. Findings consistent with improving small bowel obstruction. 2. Left inguinal hernia again noted. Colon is not distended. Electronically Signed   By: Maisie Fus  Register   On: 07/27/2020 15:08   EEG adult  Result Date: 07/28/2020 Charlsie Quest, MD     07/29/2020  9:59 AM  Patient Name: Russell Ayers MRN: [id number] Epilepsy Attending: Charlsie Quest Referring Physician/Provider: Dr Haig Prophet Date: 07/28/2020 Duration: 23.17 mins Patient history: 87 year old male with seizure-like episodes.  EEG to evaluate for seizure Level of alertness: Awake AEDs during EEG study: Ativan Technical aspects: This EEG study was done with scalp electrodes positioned according to the 10-20 International system of electrode placement. Electrical activity was acquired at Eriberto Felch sampling rate of  and reviewed with Kashif Pooler high frequency filter of  and Jayziah Bankhead low frequency filter of . EEG data were recorded continuously and digitally stored. Description:  EEG showed continuous generalized 3 to 6 Hz theta-delta slowing with overriding 15 to 18 Hz beta activity distributed symmetrically and diffusely. Hyperventilation and photic stimulation were not performed.   ABNORMALITY -Excessive beta, generalized -Continuous slow, generalized IMPRESSION: This study is suggestive of moderate diffuse encephalopathy, nonspecific etiology. No seizures or epileptiform discharges were seen throughout the recording. The excessive beta activity seen in the background is most likely due to the effect of benzodiazepine and is Gaylynn Seiple benign EEG pattern. Priyanka Annabelle Harman   Overnight EEG with video  Result Date: 07/29/2020 Charlsie Quest, MD     07/29/2020  1:24 PM Patient Name: Axl R Ayers MRN: [id number] Epilepsy Attending: Charlsie Quest Referring Physician/Provider: Dr  Brooke Dare Duration: 07/28/2020 1442 to 07/29/2020 1134  Patient history: 87 year old male with seizure-like episodes.  EEG to evaluate for seizure  Level of alertness: Awake, asleep  AEDs during EEG study: Ativan  Technical aspects: This EEG study was done with scalp electrodes positioned according to the 10-20 International system of electrode placement. Electrical activity was acquired at Avenir Lozinski sampling rate of  and reviewed with Laurabeth Yip high  frequency filter of  and Stellan Vick low frequency filter of . EEG data were recorded continuously and digitally stored.  Description: No clear posterior dominant rhythm was seen.  Sleep was characterized by sleep spindles (12 to 14 Hz), maximal frontocentral region.  EEG also showed 15 to 18 Hz beta activity distributed symmetrically and diffusely. Hyperventilation and photic stimulation were not performed.    ABNORMALITY -Excessive beta, generalized IMPRESSION: This study is within normal limits.  No seizures or epileptiform discharges were seen throughout the recording. The excessive beta activity seen in the background is most likely due to the effect of benzodiazepine and is Kayli Beal benign EEG pattern.   Charlsie Quest    Microbiology: Recent Results (from the past 240 hour(s))  Resp Panel by RT-PCR (Flu Ajanay Farve&B, Covid) Nasopharyngeal Swab     Status: None   Collection Time: 07/26/20  2:07 AM   Specimen: Nasopharyngeal Swab; Nasopharyngeal(NP) swabs in vial transport medium  Result Value Ref Range Status   SARS Coronavirus 2 by RT  PCR NEGATIVE NEGATIVE Final    Comment: (NOTE) SARS-CoV-2 target nucleic acids are NOT DETECTED.  The SARS-CoV-2 RNA is generally detectable in upper respiratory specimens during the acute phase of infection. The lowest concentration of SARS-CoV-2 viral copies this assay can detect is 138 copies/mL. Delisha Peaden negative result does not preclude SARS-Cov-2 infection and should not be used as the sole basis for treatment or other patient management decisions. Areana Kosanke negative result may occur with  improper specimen collection/handling, submission of specimen other than nasopharyngeal swab, presence of viral mutation(s) within the areas targeted by this assay, and inadequate number of viral copies(<138 copies/mL). Hayley Horn negative result must be combined with clinical observations, patient history, and epidemiological information. The expected result is Negative.  Fact Sheet for Patients:   BloggerCourse.com  Fact Sheet for Healthcare Providers:  SeriousBroker.it  This test is no t yet approved or cleared by the [place] FDA and  has been authorized for detection and/or diagnosis of SARS-CoV-2 by FDA under an Emergency Use Authorization (EUA). This EUA will remain  in effect (meaning this test can be used) for the duration of the COVID-19 declaration under Section 564(b)(1) of the Act, 21 U.S.C.section 360bbb-3(b)(1), unless the authorization is terminated  or revoked sooner.       Influenza Nollan Muldrow by PCR NEGATIVE NEGATIVE Final   Influenza B by PCR NEGATIVE NEGATIVE Final    Comment: (NOTE) The Xpert Xpress SARS-CoV-2/FLU/RSV plus assay is intended as an aid in the diagnosis of influenza from Nasopharyngeal swab specimens and should not be used as Reizel Calzada sole basis for treatment. Nasal washings and aspirates are unacceptable for Xpert Xpress SARS-CoV-2/FLU/RSV testing.  Fact Sheet for Patients: BloggerCourse.com  Fact Sheet for Healthcare Providers: SeriousBroker.it  This test is not yet approved or cleared by the [place] FDA and has been authorized for detection and/or diagnosis of SARS-CoV-2 by FDA under an Emergency Use Authorization (EUA). This EUA will remain in effect (meaning this test can be used) for the duration of the COVID-19 declaration under Section 564(b)(1) of the Act, 21 U.S.C. section 360bbb-3(b)(1), unless the authorization is terminated or revoked.  Performed at [facility], [address], [place], [place] [address]   MRSA PCR Screening     Status: None   Collection Time: 07/28/20  1:33 PM   Specimen: Nasal Mucosa; Nasopharyngeal  Result Value Ref Range Status   MRSA by PCR NEGATIVE NEGATIVE Final    Comment:        The GeneXpert MRSA Assay (FDA approved for NASAL specimens only), is one component of  Nathifa Ritthaler comprehensive MRSA colonization surveillance program. It is not intended to diagnose MRSA infection nor to guide or monitor treatment for MRSA infections. Performed at [facility], [address], [place], [place] [address]   HSV 1/2 Ab IgG/IgM CSF     Status: Abnormal   Collection Time: 07/28/20  2:28 PM   Specimen: Cerebrospinal Fluid  Result Value Ref Range Status   HSV 1/2 Ab, IgM, CSF 0.24 <=0.89 IV Final    Comment: (NOTE) INTERPRETIVE INFORMATION: Herpes Simplex Virus                          Type 1 and/or 2 Antibodies,                          IgM by ELISA, CSF  0.89 IV or Less .......... Negative: No significant  level of detectable HSV IgM                             antibody.  0.90 - 1.09 IV ........... Equivocal: Questionable                             presence of IgM antibodies.                             Repeat testing in 10-14 days                             may be helpful.  1.10 IV or Greater ....... Positive: IgM antibody to HSV                             detected, which may indicate Kable Haywood                             current or recent infection.                             However, low levels of IgM                             antibodies may occasionally                             persist for more than 12                             months post-infection. The detection of antibodies to herpes simplex virus in CSF may indicate central nervou s system infection. However, consideration must be given to possible contamination by blood or transfer of serum antibodies across the blood-brain barrier. Fourfold or greater rise in CSF antibodies to herpes on specimens at least 4 weeks apart are found in 74-94 % of patients with herpes encephalitis. Specificity of the test based on Halina Asano single CSF testing is not established. Presently PCR is the primary means of establishing Freddie Dymek diagnosis of herpes encephalitis. This test was developed  and its performance characteristics determined by [facility]. It has not been cleared or approved by the [place] Food and Drug Administration. This test was performed in [facility] CLIA certified laboratory and is intended for clinical purposes.    HSV 1/2 Ab Screen IgG, CSF 2.24 (H) <=0.89 IV Final    Comment: (NOTE) INTERPRETIVE INFORMATION: Herpes Simplex Virus Type 1 and/or 2                    Antibodies, IgG CSF  0.89 IV or Less .......... Negative: No significant                             level of detectable HSV IgG                             antibody.  0.90 - 1.09 IV ........... Equivocal: Questionable  presence of IgG antibodies.                             Repeat testing in 10-14 days                             may be helpful.  1.10 IV or Greater ....... Positive: IgG antibody to HSV                             detected, which may indicate                             Frisco Cordts current or past HSV                             infection. The detection of antibodies to herpes simplex virus in CSF may indicate central nervous system infection. However, consideration must be given to possible contamination by blood or transfer of serum antibodies across the blood-brain barrier. Fourfold or greater rise in CSF antibodies to herpes on specimens at l east 4 weeks apart are found in 74-94 % of patients with herpes encephalitis. Specificity of the test based on Mellany Dinsmore single CSF testing is not established. Presently PCR is the primary means of establishing Spirit Wernli diagnosis of herpes encephalitis. This test was developed and its performance characteristics determined by [facility]. It has not been cleared or approved by the [place] Food and Drug Administration. This test was performed in [facility]ree[facility] CLIA certified laboratory and is intended for clinical purposes. Performed At: [facility] [address] [place], [place] [id number] Elinor Parkinson I MD [id number]    HSV Type 1 Ab, IgG, CSF (Reflexed)     Status: None   Collection Time: 07/28/20  2:28 PM  Result Value Ref Range Status   HSV Type 1 Ab, IgG, CSF (Reflexed) 0.12 <=0.89 IV Final    Comment: (NOTE) INTERPRETIVE INFORMATION: Herpes Simplex Virus Type 1                          Glycoprotein G-Specific Antibody,                          IgG by ELISA, CSF  0.89 IV or Less ...... Negative: No significant level of                         detectable IgG antibody to HSV                         type 1 glycoprotein G.  0.90 - 1.10 IV ....... Equivocal: Questionable presence                         of IgG antibody to HSV type 1.                         Repeat testing in 10-14 days may                         be helpful.  1.11 IV  or Greater ... Positive: IgG antibody to HSV                         type 1 glycoprotein G detected,                         which may indicate Mahek Schlesinger current                         or past infection. Individuals infected with HSV may not exhibit detectable IgG antibody to type specific HSV antigens 1 and 2 in the early stages of infection. Detection of antibody presence in these cases may only be possible using Rocsi Hazelbaker nontype-specific screening test . The detection of antibodies to herpes simplex virus in CSF may indicate central nervous system infection. However, consideration must be given to possible contamination by blood or transfer of serum antibodies across the blood-brain barrier. Fourfold or greater rise in CSF antibodies to herpes on specimens at least 4 weeks apart are found in 74-94 percent of patients with herpes encephalitis. Specificity of the test based on Ryu Cerreta single CSF testing is not established. Presently PCR is the primary means of establishing Nateisha Moyd diagnosis of herpes encephalitis. This test was developed and its performance characteristics determined by [facility]. It has not been cleared or approved by the [place] Food and Drug Administration. This test  was performed in Jayli Fogleman CLIA certified laboratory and is intended for clinical purposes. Performed At: [facility] [address] [place], [place] [id number] Elinor Parkinson I MD NW:[id number]   CSF culture     Status: None   Collection Time: 07/28/20  2:29 PM   Specimen: CSF; Cerebrospinal Fluid  Result Value Ref Range Status   Specimen Description CSF  Final   Special Requests NONE  Final   Gram Stain   Final    WBC PRESENT, PREDOMINANTLY MONONUCLEAR NO ORGANISMS SEEN CYTOSPIN SMEAR    Culture   Final    NO GROWTH 3 DAYS Performed at [facility], [address], [place], [place] [address]    Report Status 08/01/2020 FINAL  Final  Culture, Urine     Status: Abnormal (Preliminary result)   Collection Time: 07/30/20  6:35 PM   Specimen: Urine, Random  Result Value Ref Range Status   Specimen Description URINE, RANDOM  Final   Special Requests NONE  Final   Culture (Krystol Rocco)  Final    >=100,000 COLONIES/mL PROTEUS MIRABILIS >=100,000 COLONIES/mL GRAM NEGATIVE RODS CULTURE REINCUBATED FOR BETTER GROWTH Performed at [facility], [address], [place], [place] [address]    Report Status PENDING  Incomplete   Organism ID, Bacteria PROTEUS MIRABILIS (Gar Glance)  Final      Susceptibility   Proteus mirabilis - MIC*    AMPICILLIN <=2 SENSITIVE Sensitive     CEFAZOLIN <=4 SENSITIVE Sensitive     CEFEPIME <=0.12 SENSITIVE Sensitive     CEFTRIAXONE <=0.25 SENSITIVE Sensitive     CIPROFLOXACIN <=0.25 SENSITIVE Sensitive     GENTAMICIN <=1 SENSITIVE Sensitive     IMIPENEM 2 SENSITIVE Sensitive     NITROFURANTOIN 128 RESISTANT Resistant     TRIMETH/SULFA <=20 SENSITIVE Sensitive     AMPICILLIN/SULBACTAM <=2 SENSITIVE Sensitive     PIP/TAZO <=4 SENSITIVE Sensitive     * >=100,000 COLONIES/mL PROTEUS MIRABILIS  SARS CORONAVIRUS 2 (TAT 6-24 HRS) Nasopharyngeal Nasopharyngeal Swab  Status: None   Collection Time: 08/01/20 11:35 AM   Specimen: Nasopharyngeal Swab  Result  Value Ref Range Status   SARS Coronavirus 2 NEGATIVE NEGATIVE Final    Comment: (NOTE) SARS-CoV-2 target nucleic acids are NOT DETECTED.  The SARS-CoV-2 RNA is generally detectable in upper and lower respiratory specimens during the acute phase of infection. Negative results do not preclude SARS-CoV-2 infection, do not rule out co-infections with other pathogens, and should not be used as the sole basis for treatment or other patient management decisions. Negative results must be combined with clinical observations, patient history, and epidemiological information. The expected result is Negative.  Fact Sheet for Patients: HairSlick.no  Fact Sheet for Healthcare Providers: quierodirigir.com  This test is not yet approved or cleared by the [place] FDA and  has been authorized for detection and/or diagnosis of SARS-CoV-2 by FDA under an Emergency Use Authorization (EUA). This EUA will remain  in effect (meaning this test can be used) for the duration of the COVID-19 declaration under Se ction 564(b)(1) of the Act, 21 U.S.C. section 360bbb-3(b)(1), unless the authorization is terminated or revoked sooner.  Performed at [facility], [address] [place], [place] [address]      Labs: Basic Metabolic Panel: Recent Labs  Lab 07/28/20 0437 07/28/20 1318 07/29/20 0234 07/30/20 0046 07/31/20 0228 08/01/20 0413 08/02/20 0236  NA 133* 134* 135 135 135 132* 135  K 3.6 3.6 3.6 3.4* 4.1 4.1 4.0  CL 104 101 102 104 105 102 103  CO2 21* GLUCOSE 131* 127* 127* 142* 132* 151* 150*  BUN 7* 6* 5* CREATININE 0.69 0.82 0.79 0.80 0.90 0.76 0.78  CALCIUM 8.7* 9.0 8.8* 8.6* 8.5* 8.2* 8.7*  MG 1.8 2.0  --   --   --   --   --   PHOS 1.9* 2.4*  --   --   --   --   --    Liver Function Tests: Recent Labs  Lab 07/29/20 0234 07/30/20 0046 07/31/20 0228 08/01/20 0413 08/02/20 0236  AST  30 28 38 26 19  ALT ALKPHOS 51 62 71 85 83  BILITOT 0.6 0.5 1.4* 0.6 0.3  PROT 6.0* 5.8* 5.3* 5.2* 5.3*  ALBUMIN 3.1* 2.9* 2.5* 2.4* 2.5*   No results for input(s): LIPASE, AMYLASE in the last 168 hours. Recent Labs  Lab 07/29/20 0234  AMMONIA 15   CBC: Recent Labs  Lab 07/29/20 0234 07/30/20 0046 07/31/20 0228 08/01/20 0413 08/02/20 0236  WBC 8.4 9.4 8.6 6.8 7.0  NEUTROABS 5.7 6.9 5.3 3.7 3.4  HGB 11.1* 11.3* 10.4* 10.1* 10.4*  HCT 31.6* 32.0* 29.9* 28.9* 30.6*  MCV 92.9 93.6 94.6 93.8 93.9  PLT 231 286 218 221 244   Cardiac Enzymes: Recent Labs  Lab 07/28/20 1318 07/29/20 0234 07/29/20 1257 07/29/20 1652  CKTOTAL 324 560* 745* 623*   BNP: BNP (last 3 results) No results for input(s): BNP in the last 8760 hours.  ProBNP (last 3 results) No results for input(s): PROBNP in the last 8760 hours.  CBG: Recent Labs  Lab 08/02/20 1203 08/02/20 1607 08/02/20 2310 08/03/20 0338 08/03/20 0756  GLUCAP 219* 128* 169* 143* 146*       Signed:  Lacretia Nicks MD.  [facility] 08/03/2020, 11:34 AM

## 2020-08-04 LAB — URINE CULTURE: Culture: >=100000 — AB

## 2020-08-04 LAB — HSV TYPE 2 AB IGG, CSF (REFLEXED): HSV Type 2 Ab IgG, CSF (Reflexed): 1.05 IV — ABNORMAL HIGH (ref <=0.89)

## 2020-08-05 LAB — METHYLMALONIC ACID, SERUM: Methylmalonic Acid, Quantitative: 205 nmol/L (ref 0–378)

## 2020-08-10 ENCOUNTER — Ambulatory Visit: Payer: Medicare Other | Admitting: Podiatry

## 2020-08-13 ENCOUNTER — Other Ambulatory Visit: Payer: Self-pay | Admitting: *Deleted

## 2020-08-13 NOTE — Patient Outreach (Signed)
Member screened for potential [facility] Care Management needs.  Per [facility] (Patient Ilda Foil) patient resides in [facility].   Communication sent to facility SW to inquire about transition plans. Noted member is from [facility] prior.   Will continue to follow for potential [facility] Care Management services while member resides in SNF.    Raiford Noble, MSN, RN,BSN [facility] Post Acute Care Coordinator [phone] [facility]) [phone]  (Toll free office)

## 2020-08-30 ENCOUNTER — Other Ambulatory Visit: Payer: Self-pay | Admitting: *Deleted

## 2020-08-30 NOTE — Patient Outreach (Signed)
[facility] Post-Acute Care Coordinator follow up.   Verified in [facility] (Patient Russell Ayers) that member transitioned to ILF/ home from [facility] on 08/27/20.   Telephone call to Mr. Russell Ayers [phone]. Patient identifiers confirmed. Member confirms he has returned to his ILF. Reports PCP appointment scheduled for this week.  Explained [facility] Care Management services. Denies having any needs.   Expressed appreciation of the call.    Raiford Noble, MSN, RN,BSN [facility] Post Acute Care Coordinator [phone] [facility]) [phone]  (Toll free office)

## 2020-11-16 ENCOUNTER — Other Ambulatory Visit: Payer: Self-pay

## 2020-11-16 ENCOUNTER — Ambulatory Visit (INDEPENDENT_AMBULATORY_CARE_PROVIDER_SITE_OTHER): Payer: Medicare Other | Admitting: Podiatry

## 2020-11-16 ENCOUNTER — Encounter: Payer: Self-pay | Admitting: Podiatry

## 2020-11-16 DIAGNOSIS — E1142 Type 2 diabetes mellitus with diabetic polyneuropathy: Secondary | ICD-10-CM

## 2020-11-16 DIAGNOSIS — M79675 Pain in left toe(s): Secondary | ICD-10-CM | POA: Diagnosis not present

## 2020-11-16 DIAGNOSIS — M79674 Pain in right toe(s): Secondary | ICD-10-CM | POA: Diagnosis not present

## 2020-11-16 DIAGNOSIS — B351 Tinea unguium: Secondary | ICD-10-CM

## 2021-02-04 ENCOUNTER — Inpatient Hospital Stay (HOSPITAL_COMMUNITY)
Admission: EM | Admit: 2021-02-04 | Discharge: 2021-02-08 | DRG: 481 | Disposition: A | Discharge status: Rehabilitation Facility | Payer: Medicare Other | Source: Skilled Nursing Facility | Attending: Internal Medicine | Admitting: Internal Medicine

## 2021-02-04 ENCOUNTER — Emergency Department (HOSPITAL_COMMUNITY): Payer: Medicare Other

## 2021-02-04 ENCOUNTER — Encounter (HOSPITAL_COMMUNITY): Payer: Self-pay

## 2021-02-04 DIAGNOSIS — R32 Unspecified urinary incontinence: Secondary | ICD-10-CM | POA: Diagnosis present

## 2021-02-04 DIAGNOSIS — R7303 Prediabetes: Secondary | ICD-10-CM | POA: Diagnosis not present

## 2021-02-04 DIAGNOSIS — Z8781 Personal history of (healed) traumatic fracture: Secondary | ICD-10-CM

## 2021-02-04 DIAGNOSIS — Z20822 Contact with and (suspected) exposure to covid-19: Secondary | ICD-10-CM | POA: Diagnosis not present

## 2021-02-04 DIAGNOSIS — Z9841 Cataract extraction status, right eye: Secondary | ICD-10-CM

## 2021-02-04 DIAGNOSIS — Z8673 Personal history of transient ischemic attack (TIA), and cerebral infarction without residual deficits: Secondary | ICD-10-CM | POA: Diagnosis not present

## 2021-02-04 DIAGNOSIS — N138 Other obstructive and reflux uropathy: Secondary | ICD-10-CM | POA: Diagnosis not present

## 2021-02-04 DIAGNOSIS — D62 Acute posthemorrhagic anemia: Secondary | ICD-10-CM | POA: Diagnosis not present

## 2021-02-04 DIAGNOSIS — S72092A Other fracture of head and neck of left femur, initial encounter for closed fracture: Principal | ICD-10-CM | POA: Diagnosis present

## 2021-02-04 DIAGNOSIS — I493 Ventricular premature depolarization: Secondary | ICD-10-CM | POA: Diagnosis present

## 2021-02-04 DIAGNOSIS — G40909 Epilepsy, unspecified, not intractable, without status epilepticus: Secondary | ICD-10-CM | POA: Diagnosis not present

## 2021-02-04 DIAGNOSIS — I10 Essential (primary) hypertension: Secondary | ICD-10-CM | POA: Diagnosis present

## 2021-02-04 DIAGNOSIS — Z87440 Personal history of urinary (tract) infections: Secondary | ICD-10-CM | POA: Diagnosis not present

## 2021-02-04 DIAGNOSIS — Z66 Do not resuscitate: Secondary | ICD-10-CM | POA: Diagnosis present

## 2021-02-04 DIAGNOSIS — N401 Enlarged prostate with lower urinary tract symptoms: Secondary | ICD-10-CM | POA: Diagnosis present

## 2021-02-04 DIAGNOSIS — Z8249 Family history of ischemic heart disease and other diseases of the circulatory system: Secondary | ICD-10-CM

## 2021-02-04 DIAGNOSIS — F411 Generalized anxiety disorder: Secondary | ICD-10-CM | POA: Diagnosis present

## 2021-02-04 DIAGNOSIS — G4700 Insomnia, unspecified: Secondary | ICD-10-CM | POA: Diagnosis not present

## 2021-02-04 DIAGNOSIS — T148XXA Other injury of unspecified body region, initial encounter: Secondary | ICD-10-CM

## 2021-02-04 DIAGNOSIS — R310 Gross hematuria: Secondary | ICD-10-CM | POA: Diagnosis not present

## 2021-02-04 DIAGNOSIS — Y9301 Activity, walking, marching and hiking: Secondary | ICD-10-CM | POA: Diagnosis present

## 2021-02-04 DIAGNOSIS — R569 Unspecified convulsions: Secondary | ICD-10-CM

## 2021-02-04 DIAGNOSIS — Z79899 Other long term (current) drug therapy: Secondary | ICD-10-CM

## 2021-02-04 DIAGNOSIS — Z9842 Cataract extraction status, left eye: Secondary | ICD-10-CM | POA: Diagnosis not present

## 2021-02-04 DIAGNOSIS — W010XXA Fall on same level from slipping, tripping and stumbling without subsequent striking against object, initial encounter: Secondary | ICD-10-CM | POA: Diagnosis present

## 2021-02-04 DIAGNOSIS — Z87891 Personal history of nicotine dependence: Secondary | ICD-10-CM

## 2021-02-04 DIAGNOSIS — S72002A Fracture of unspecified part of neck of left femur, initial encounter for closed fracture: Secondary | ICD-10-CM

## 2021-02-04 DIAGNOSIS — N400 Enlarged prostate without lower urinary tract symptoms: Secondary | ICD-10-CM

## 2021-02-04 HISTORY — DX: Personal history of (healed) traumatic fracture: Z87.81

## 2021-02-04 HISTORY — DX: Cerebral infarction, unspecified: I63.9

## 2021-02-04 LAB — BASIC METABOLIC PANEL
Anion gap: 8 (ref 5–15)
BUN: 13 mg/dL (ref 8–23)
CO2: 27 mmol/L (ref 22–32)
Calcium: 9.6 mg/dL (ref 8.9–10.3)
Chloride: 107 mmol/L (ref 98–111)
Creatinine, Ser: 0.88 mg/dL (ref 0.61–1.24)
GFR, Estimated: >60 mL/min (ref >60)
Glucose, Bld: 134 mg/dL — ABNORMAL HIGH (ref 70–99)
Potassium: 3.6 mmol/L (ref 3.5–5.1)
Sodium: 142 mmol/L (ref 135–145)

## 2021-02-04 LAB — CBC WITH DIFFERENTIAL/PLATELET
Abs Immature Granulocytes: 0.03 10*3/uL (ref 0.00–0.07)
Basophils Absolute: 0 10*3/uL (ref 0.0–0.1)
Basophils Relative: 0 %
Eosinophils Absolute: 0.1 10*3/uL (ref 0.0–0.5)
Eosinophils Relative: 1 %
HCT: 38.3 % — ABNORMAL LOW (ref 39.0–52.0)
Hemoglobin: 12.7 g/dL — ABNORMAL LOW (ref 13.0–17.0)
Immature Granulocytes: 0 %
Lymphocytes Relative: 9 %
Lymphs Abs: 0.9 10*3/uL (ref 0.7–4.0)
MCH: 31.7 pg (ref 26.0–34.0)
MCHC: 33.2 g/dL (ref 30.0–36.0)
MCV: 95.5 fL (ref 80.0–100.0)
Monocytes Absolute: 0.5 10*3/uL (ref 0.1–1.0)
Monocytes Relative: 5 %
Neutro Abs: 8.3 10*3/uL — ABNORMAL HIGH (ref 1.7–7.7)
Neutrophils Relative %: 85 %
Platelets: 189 10*3/uL (ref 150–400)
RBC: 4.01 MIL/uL — ABNORMAL LOW (ref 4.22–5.81)
RDW: 13 % (ref 11.5–15.5)
WBC: 9.9 10*3/uL (ref 4.0–10.5)
nRBC: 0 % (ref 0.0–0.2)

## 2021-02-04 LAB — RESP PANEL BY RT-PCR (FLU A&B, COVID) ARPGX2
Influenza A by PCR: NEGATIVE
Influenza B by PCR: NEGATIVE
SARS Coronavirus 2 by RT PCR: NEGATIVE

## 2021-02-04 LAB — TYPE AND SCREEN
ABO/RH(D): O POS
Antibody Screen: NEGATIVE

## 2021-02-04 LAB — PROTIME-INR
INR: 1 (ref 0.8–1.2)
Prothrombin Time: 13.3 seconds (ref 11.4–15.2)

## 2021-02-04 MED ORDER — MORPHINE SULFATE (PF) 2 MG/ML IV SOLN: 2.0000 mg | INTRAVENOUS | Status: DC | PRN

## 2021-02-04 MED ORDER — ACETAMINOPHEN 325 MG PO TABS
650.0000 mg | ORAL_TABLET | Freq: Four times a day (QID) | ORAL | Status: DC | PRN
Start: 2021-02-04 — End: 2021-02-05

## 2021-02-04 MED ORDER — ONDANSETRON HCL 4 MG PO TABS: 4.0000 mg | ORAL_TABLET | Freq: Four times a day (QID) | ORAL | Status: DC | PRN

## 2021-02-04 MED ORDER — LORAZEPAM 1 MG PO TABS
2.0000 mg | ORAL_TABLET | Freq: Once | ORAL | Status: AC
  Administered 2021-02-04: 2 mg via ORAL
  Filled 2021-02-04: qty 2

## 2021-02-04 MED ORDER — LATANOPROST 0.005 % OP SOLN
1.0000 [drp] | Freq: Every day | OPHTHALMIC | Status: DC
  Administered 2021-02-04 – 2021-02-07 (×4): 1 [drp] via OPHTHALMIC
  Filled 2021-02-04: qty 2.5

## 2021-02-04 MED ORDER — PSYLLIUM 95 % PO PACK
1.0000 | PACK | Freq: Every evening | ORAL | Status: DC
  Administered 2021-02-05 – 2021-02-06 (×2): 1 via ORAL
  Filled 2021-02-04 (×5): qty 1

## 2021-02-04 MED ORDER — MORPHINE SULFATE (PF) 2 MG/ML IV SOLN
2.0000 mg | Freq: Once | INTRAVENOUS | Status: AC
  Administered 2021-02-04: 2 mg via INTRAVENOUS
  Filled 2021-02-04: qty 1

## 2021-02-04 MED ORDER — ONDANSETRON HCL 4 MG/2ML IJ SOLN: 4.0000 mg | Freq: Four times a day (QID) | INTRAMUSCULAR | Status: DC | PRN

## 2021-02-04 MED ORDER — TEMAZEPAM 15 MG PO CAPS
30.0000 mg | ORAL_CAPSULE | Freq: Every day | ORAL | Status: DC
  Administered 2021-02-04 – 2021-02-07 (×4): 30 mg via ORAL
  Filled 2021-02-04 (×4): qty 2

## 2021-02-04 MED ORDER — FLUTICASONE PROPIONATE 50 MCG/ACT NA SUSP
1.0000 | Freq: Every day | NASAL | Status: DC | PRN
  Filled 2021-02-04: qty 16

## 2021-02-04 MED ORDER — ACETAMINOPHEN 650 MG RE SUPP: 650.0000 mg | Freq: Four times a day (QID) | RECTAL | Status: DC | PRN

## 2021-02-04 MED ORDER — MORPHINE SULFATE (PF) 2 MG/ML IV SOLN
2.0000 mg | Freq: Once | INTRAVENOUS | Status: AC
Start: 2021-02-04 — End: 2021-02-04
  Administered 2021-02-04: 2 mg via INTRAVENOUS
  Filled 2021-02-04: qty 1

## 2021-02-04 MED ORDER — HYDROCODONE-ACETAMINOPHEN 5-325 MG PO TABS
1.0000 | ORAL_TABLET | ORAL | Status: DC | PRN
  Administered 2021-02-05: 2 via ORAL
  Filled 2021-02-04: qty 2

## 2021-02-04 MED ORDER — TIMOLOL MALEATE 0.5 % OP SOLN
1.0000 [drp] | Freq: Every evening | OPHTHALMIC | Status: DC
  Administered 2021-02-04 – 2021-02-07 (×4): 1 [drp] via OPHTHALMIC
  Filled 2021-02-04: qty 5

## 2021-02-04 MED ORDER — SODIUM CHLORIDE 0.9 % IV SOLN: Freq: Once | INTRAVENOUS | Status: AC

## 2021-02-04 MED ORDER — VITAMIN B-12 100 MCG PO TABS
100.0000 ug | ORAL_TABLET | Freq: Every day | ORAL | Status: DC
  Administered 2021-02-06 – 2021-02-08 (×3): 100 ug via ORAL
  Filled 2021-02-04 (×4): qty 1

## 2021-02-04 MED ORDER — METOPROLOL TARTRATE 5 MG/5ML IV SOLN
5.0000 mg | Freq: Four times a day (QID) | INTRAVENOUS | Status: DC | PRN
Start: 2021-02-04 — End: 2021-02-08

## 2021-02-04 MED ORDER — FINASTERIDE 5 MG PO TABS
5.0000 mg | ORAL_TABLET | Freq: Every day | ORAL | Status: DC
  Administered 2021-02-06 – 2021-02-08 (×3): 5 mg via ORAL
  Filled 2021-02-04 (×3): qty 1

## 2021-02-04 MED ORDER — TIMOLOL HEMIHYDRATE 0.5 % OP SOLN: 1.0000 [drp] | Freq: Every evening | OPHTHALMIC | Status: DC

## 2021-02-04 MED ORDER — BENAZEPRIL HCL 10 MG PO TABS
10.0000 mg | ORAL_TABLET | Freq: Every day | ORAL | Status: DC
  Administered 2021-02-06 – 2021-02-08 (×3): 10 mg via ORAL
  Filled 2021-02-04 (×3): qty 1

## 2021-02-04 MED ORDER — TAMSULOSIN HCL 0.4 MG PO CAPS
0.4000 mg | ORAL_CAPSULE | Freq: Every evening | ORAL | Status: DC
  Administered 2021-02-04 – 2021-02-06 (×3): 0.4 mg via ORAL
  Filled 2021-02-04 (×3): qty 1

## 2021-02-04 NOTE — ED Notes (Signed)
Pt cleaned up and changed into a brief

## 2021-02-04 NOTE — ED Provider Notes (Signed)
[facility]-EMERGENCY DEPT Provider Note   CSN: [id number] Arrival date & time: 02/04/21  1546     History Chief Complaint  Patient presents with   Hip Pain    Hip fracture    Russell Ayers is a 87 y.o. male.  HPI  87 year old male with past medical history of HTN, DM, previous CVA who ambulates with a cane presents emergency department after mechanical fall.  Patient states earlier today he tripped while walking outside on grass surface.  Fell down onto the sidewalk on his left hip.  Had immediate pain.  Did not hit his head, no loss of consciousness or syncope.  Does not take any blood thinners.  Past Medical History:  Diagnosis Date   Diabetes mellitus without complication (HCC)    Generalized anxiety disorder    Glaucoma    Hypertension    Inguinal hernia    Insomnia    Prostatism    Stroke [facility])     Patient Active Problem List   Diagnosis Date Noted   Left lower quadrant abdominal pain    Seizure (HCC)    SBO (small bowel obstruction) (HCC) 07/26/2020   Essential hypertension 07/26/2020   Diabetic neuropathy (HCC) 04/30/2020   Pain due to onychomycosis of toenails of both feet 11/29/2018    Past Surgical History:  Procedure Laterality Date   CATARACT EXTRACTION, BILATERAL     glaucoma         Family History  Problem Relation Age of Onset   Cancer Brother    Heart attack Brother     Social History   Tobacco Use   Smoking status: Former    Types: Cigarettes    Quit date: 02/18/1989    Years since quitting: 31.9   Smokeless tobacco: Never  Substance Use Topics   Alcohol use: Never   Drug use: Never    Home Medications Prior to Admission medications   Medication Sig Start Date End Date Taking? Authorizing Provider  benazepril (LOTENSIN) 10 MG tablet Take 10 mg by mouth daily. 10/20/19   [provider]  cetirizine (ZYRTEC) 10 MG chewable tablet Chew 10-20 mg by mouth See admin instructions. 20mg  in the morning   10mg  at noon 10mg  in the afternoon    [provider]  diphenhydrAMINE (BENADRYL) 25 MG tablet Take 25 mg by mouth at bedtime.    [provider]  finasteride (PROSCAR) 5 MG tablet Take 5 mg by mouth daily. 09/30/18   [provider]  fluticasone (FLONASE) 50 MCG/ACT nasal spray Place 1-2 sprays into both nostrils daily as needed for allergies.    [provider]  hydrochlorothiazide (HYDRODIURIL) 12.5 MG tablet Take 12.5 mg by mouth daily. 08/21/20   [provider]  latanoprost (XALATAN) 0.005 % ophthalmic solution Place 1 drop into both eyes at bedtime. 11/20/18   [provider]  LORazepam (ATIVAN) 2 MG tablet Take 2 mg by mouth 2 (two) times daily as needed. 02/04/21   [provider]  Melatonin 10 MG TABS Take 20-40 mg by mouth See admin instructions. 20mg  every night And 20mg  more if needed for sleep    [provider]  PSYLLIUM HUSK PO Take 1 tablet by mouth 3 (three) times daily.    [provider]  Saw Palmetto, Serenoa repens, (SAW PALMETTO PO) Take 1 tablet by mouth daily.    [provider]  tamsulosin (FLOMAX) 0.4 MG CAPS capsule Take 0.4 mg by mouth daily. 11/25/20  [provider]  temazepam (RESTORIL) 30 MG capsule Take 1 capsule (30 mg total) by mouth at bedtime for 3 days. 08/03/20 08/06/20  Zigmund Daniel., MD  terazosin (HYTRIN) 1 MG capsule Take 1 mg by mouth at bedtime. 09/30/18   [provider]  timolol (TIMOPTIC) 0.5 % ophthalmic solution Place 1 drop into both eyes daily. 09/20/18   [provider]    Allergies    Patient has no known allergies.  Review of Systems   Review of Systems  Constitutional:  Negative for fever.  HENT:  Negative for congestion.   Eyes:  Negative for visual disturbance.  Respiratory:  Negative for shortness of breath.   Cardiovascular:  Negative for chest pain.  Gastrointestinal:  Negative for abdominal pain.  Genitourinary:   Negative for dysuria.  Musculoskeletal:  Negative for back pain and neck pain.       + left hip pain  Skin:  Negative for rash.  Neurological:  Negative for headaches.   Physical Exam Updated Vital Signs BP (!) 163/86   Pulse 84   Temp 98.4 F (36.9 C) (Oral)   Resp (!) 23   Ht 5\' 11"  (1.803 m)   Wt 77.1 kg   SpO2 96%   BMI 23.71 kg/m   Physical Exam Vitals and nursing note reviewed.  Constitutional:      Appearance: Normal appearance.  HENT:     Head: Normocephalic.     Mouth/Throat:     Mouth: Mucous membranes are moist.  Cardiovascular:     Rate and Rhythm: Normal rate.  Pulmonary:     Effort: Pulmonary effort is normal. No respiratory distress.  Musculoskeletal:     Comments: TTP with mild swelling of left hip, LLE is externally rotated, left foot is warm and well perfused, neuro in tact  Skin:    General: Skin is warm.  Neurological:     Mental Status: He is alert and oriented to person, place, and time. Mental status is at baseline.  Psychiatric:        Mood and Affect: Mood normal.    ED Results / Procedures / Treatments   Labs (all labs ordered are listed, but only abnormal results are displayed) Labs Reviewed - No data to display  EKG EKG Interpretation  Date/Time:  Friday February 04 2021 16:00:15 EDT Ventricular Rate:  86 PR Interval:  164 QRS Duration: 90 QT Interval:  391 QTC Calculation: 468 R Axis:   -24 Text Interpretation: Sinus rhythm Multiple premature complexes, vent & supraven Borderline left axis deviation Low voltage, precordial leads NSR with PAC and PVCs Confirmed by 12-15-2004 [phone]) on 02/04/2021 4:53:30 PM  Radiology No results found.  Procedures Procedures   Medications Ordered in ED Medications  morphine 2 MG/ML injection 2 mg (has no administration in time range)    ED Course  I have reviewed the triage vital signs and the nursing notes.  Pertinent labs & imaging results that were available during my care of  the patient were reviewed by me and considered in my medical decision making (see chart for details).    MDM Rules/Calculators/A&P                           87 year old male presents the emergency department after mechanical fall down onto the left hip with immediate pain.  The left lower extremity is externally rotated, neuro vascularly intact.  Vitals are stable on arrival, patient  denies any other injury.  Pending x-rays.  X-ray shows a left basicervical femur fracture.  Spoke with on-call orthopedic surgeon Dr. Eulah Pont who recommends preoperative labs, pain control and he will plan for surgical intervention tomorrow morning.  Patient and family have been updated.  Patient will require admission with the medical team, stable at time of admission.  Final Clinical Impression(s) / ED Diagnoses Final diagnoses:  None    Rx / DC Orders ED Discharge Orders     None        Rozelle Logan, DO 02/04/21 2025

## 2021-02-04 NOTE — ED Triage Notes (Signed)
Pt presents to the ED via EMS for a possible left hip fracture. Per EMS, the pt presents from his assisted living facility and tripped while walking from a grass surface to a sidewalk. Per EMS, pt did not hit his head, denies LOC and does not take blood thinners. Prior to the fall, pt was ambulatory with a cane, pt was moved from the ground, c/o pain and was placed in a wheelchair prior to EMS arrival. There is external rotation of the left hip. Pt has dysphagia from a previous stroke, unknown when, and HTN.

## 2021-02-04 NOTE — ED Notes (Signed)
ED TO INPATIENT HANDOFF REPORT  Name/Age/Gender Russell Ayers 87 y.o. male  Code Status Code Status History     Date Active Date Inactive Code Status Order ID Comments User Context   07/26/2020 0419 08/03/2020 1834 Full Code [id number]  Eduard Clos, MD Inpatient      Questions for Most Recent Historical Code Status (Order [id number])        Home/SNF/Other Home  Chief Complaint S/p left hip fracture [Z87.81]  Level of Care/Admitting Diagnosis ED Disposition     ED Disposition  Admit   Condition  --   Comment  Hospital Area: [facility] [[id number]]  Level of Care: Med-Surg [16]  May admit patient to [facility] or [name] if equivalent level of care is available:: Yes  Covid Evaluation: Asymptomatic Screening Protocol (No Symptoms)  Diagnosis: S/p left hip fracture [6568127]  Admitting Physician: Samara Snide  Attending Physician: Reva Bores [[id number]]  Estimated length of stay: past midnight tomorrow  Certification:: I certify this patient will need inpatient services for at least 2 midnights          Medical History Past Medical History:  Diagnosis Date   Diabetes mellitus without complication (HCC)    Generalized anxiety disorder    Glaucoma    Hypertension    Inguinal hernia    Insomnia    Prostatism    Stroke (HCC)     Allergies No Known Allergies  IV Location/Drains/Wounds Patient Lines/Drains/Airways Status     Active Line/Drains/Airways     Name Placement date Placement time Site Days   Peripheral IV 02/04/21 20 G Left Antecubital 02/04/21  1821  Antecubital  less than 1   External Urinary Catheter 07/26/20  0800  --  193            Labs/Imaging Results for orders placed or performed during the hospital encounter of 02/04/21 (from the past 48 hour(s))  CBC with Differential     Status: Abnormal   Collection Time: 02/04/21  8:15 PM  Result Value Ref Range   WBC 9.9 4.0 - 10.5 K/uL   RBC 4.01  (L) 4.22 - 5.81 MIL/uL   Hemoglobin 12.7 (L) 13.0 - 17.0 g/dL   HCT 51.7 (L) 00.1 - 74.9 %   MCV 95.5 80.0 - 100.0 fL   MCH 31.7 26.0 - 34.0 pg   MCHC 33.2 30.0 - 36.0 g/dL   RDW 44.9 67.5 - 91.6 %   Platelets 189 150 - 400 K/uL   nRBC 0.0 0.0 - 0.2 %   Neutrophils Relative % 85 %   Neutro Abs 8.3 (H) 1.7 - 7.7 K/uL   Lymphocytes Relative 9 %   Lymphs Abs 0.9 0.7 - 4.0 K/uL   Monocytes Relative 5 %   Monocytes Absolute 0.5 0.1 - 1.0 K/uL   Eosinophils Relative 1 %   Eosinophils Absolute 0.1 0.0 - 0.5 K/uL   Basophils Relative 0 %   Basophils Absolute 0.0 0.0 - 0.1 K/uL   Immature Granulocytes 0 %   Abs Immature Granulocytes 0.03 0.00 - 0.07 K/uL    Comment: Performed at [facility], [address] [place], [place] [address]  Basic metabolic panel     Status: Abnormal   Collection Time: 02/04/21  8:15 PM  Result Value Ref Range   Sodium 142 135 - 145 mmol/L   Potassium 3.6 3.5 - 5.1 mmol/L   Chloride 107 98 - 111 mmol/L   CO2  27 22 - 32 mmol/L   Glucose, Bld 134 (H) 70 - 99 mg/dL    Comment: Glucose reference range applies only to samples taken after fasting for at least 8 hours.   BUN 13 8 - 23 mg/dL   Creatinine, Ser 2.83 0.61 - 1.24 mg/dL   Calcium 9.6 8.9 - 66.2 mg/dL   GFR, Estimated >94 >76 mL/min    Comment: (NOTE) Calculated using the CKD-EPI Creatinine Equation (2021)    Anion gap 8 5 - 15    Comment: Performed at [facility], [address], [place], [place] [address]  Protime-INR     Status: None   Collection Time: 02/04/21  8:15 PM  Result Value Ref Range   Prothrombin Time 13.3 11.4 - 15.2 seconds   INR 1.0 0.8 - 1.2    Comment: (NOTE) INR goal varies based on device and disease states. Performed at [facility], [address] [place], [place] [address]    DG Hip Lucienne Capers or Wo Pelvis 2-3 Views Left  Result Date: 02/04/2021 CLINICAL DATA:  Hip pain after fall EXAM: DG HIP (WITH OR WITHOUT  PELVIS) 2-3V LEFT COMPARISON:  None. FINDINGS: Pubic symphysis appears intact. The SI joints are non widened. Acute displaced basicervical fracture without femoral head dislocation. IMPRESSION: Acute displaced basicervical fracture without femoral head displacement. Electronically Signed   By: Jasmine Pang M.D.   On: 02/04/2021 19:05    Pending Labs Unresulted Labs (From admission, onward)     Start     Ordered   02/04/21 2000  Type and screen [facility]  Once,   STAT       Comments: [facility]    02/04/21 2000   02/04/21 2000  Resp Panel by RT-PCR (Flu A&B, Covid) Nasopharyngeal Swab  (Tier 2 - Symptomatic/asymptomatic with Precautions )  Once,   STAT       Question Answer Comment  Is this test for diagnosis or screening Screening   Symptomatic for COVID-19 as defined by CDC No   Hospitalized for COVID-19 No   Admitted to ICU for COVID-19 No   Previously tested for COVID-19 Yes   Resident in a congregate (group) care setting No   Employed in healthcare setting No   Has patient completed COVID vaccination(s) (2 doses of Pfizer/Moderna 1 dose of [employer]) Unknown      02/04/21 2000            Vitals/Pain Today's Vitals   02/04/21 1822 02/04/21 1831 02/04/21 1947 02/04/21 2055  BP: (!) 178/101 (!) 181/87 (!) 185/96   Pulse: 94 96 (!) 112   Resp: 20 (!) 22 19   Temp:      TempSrc:      SpO2: 99% 95% 94%   Weight:      Height:      PainSc: 8    8     Isolation Precautions Airborne and Contact precautions  Medications Medications  LORazepam (ATIVAN) tablet 2 mg (has no administration in time range)  0.9 %  sodium chloride infusion (has no administration in time range)  morphine 2 MG/ML injection 2 mg (2 mg Intravenous Given 02/04/21 1821)  morphine 2 MG/ML injection 2 mg (2 mg Intravenous Given 02/04/21 2001)    Mobility non-ambulatory

## 2021-02-04 NOTE — ED Notes (Signed)
Attending provider notified of pt HR jumping up to 140s. See orders

## 2021-02-04 NOTE — ED Notes (Signed)
Xray notified and aware pt has received pain medication at this time.

## 2021-02-04 NOTE — H&P (Addendum)
History and Physical    Russell Ayers HDI:[id number] DOB: 1932/12/10 DOA: 02/04/2021  PCP: Soundra Pilon, FNP  Patient coming from: [facility] and [place] -independent living  I have personally briefly reviewed patient's old medical records in [facility]  Chief Complaint: Hip pain  HPI: Russell Ayers is a 87 y.o. male with medical history significant of HTN, borderline DM, previous seizure following benzodiazepine withdrawal who ambulates with a cane who went out a door this morning and fell.  He does not recall any presyncopal symptoms or tripping.  He fell to the sidewalk and hurt his hip.  He does not have any loss of consciousness. (ED Course: In the ED he was noted to have a left hip fracture.  Orthopedics was consulted and will offer in the morning.  TRH was asked to admit.  Review of Systems: As per HPI otherwise 10 point review of systems negative.  Patient denies chest pain or shortness of breath with exertion.  Past Medical History:  Diagnosis Date   Diabetes mellitus without complication (HCC)    Generalized anxiety disorder    Glaucoma    Hypertension    Inguinal hernia    Insomnia    Prostatism    Stroke [facility])     Past Surgical History:  Procedure Laterality Date   CATARACT EXTRACTION, BILATERAL     glaucoma       reports that he quit smoking about 31 years ago. His smoking use included cigarettes. He has never used smokeless tobacco. He reports that he does not drink alcohol and does not use drugs.  No Known Allergies  Family History  Problem Relation Age of Onset   Cancer Brother    Heart attack Brother    [facility] [address] in [place]  Prior to Admission medications   Medication Sig Start Date End Date Taking? Authorizing Provider  benazepril (LOTENSIN) 10 MG tablet Take 10 mg by mouth daily. 10/20/19  Yes [provider]  diphenhydrAMINE (BENADRYL) 25 MG tablet Take 50 mg by mouth  in the morning and at bedtime.   Yes [provider]  finasteride (PROSCAR) 5 MG tablet Take 5 mg by mouth daily. 09/30/18  Yes [provider]  fluticasone (FLONASE) 50 MCG/ACT nasal spray Place 1-2 sprays into both nostrils daily as needed for allergies.   Yes [provider]  latanoprost (XALATAN) 0.005 % ophthalmic solution Place 1 drop into both eyes at bedtime. 11/20/18  Yes [provider]  LORazepam (ATIVAN) 2 MG tablet Take 2 mg by mouth at bedtime as needed for sleep. 02/04/21  Yes [provider]  Melatonin 10 MG TABS Take 40-60 mg by mouth at bedtime as needed (sleep).   Yes [provider]  Nutritional Supplements (PROSTATE PO) Take 1 capsule by mouth every evening.   Yes [provider]  Omega-3 Fatty Acids (FISH OIL PO) Take 1 capsule by mouth once a week.   Yes [provider]  PSYLLIUM HUSK PO Take 1 tablet by mouth every evening.   Yes [provider]  tamsulosin (FLOMAX) 0.4 MG CAPS capsule Take 0.4 mg by mouth every evening. 11/25/20  Yes [provider]  temazepam (RESTORIL) 30 MG capsule Take 30 mg by mouth at bedtime.   Yes [provider]  timolol (BETIMOL) 0.5 % ophthalmic solution Place 1 drop into both eyes every evening.   Yes [provider]  vitamin B-12 (CYANOCOBALAMIN) 100 MCG tablet  Take 100 mcg by mouth daily.   Yes [provider]  temazepam (RESTORIL) 30 MG capsule Take 1 capsule (30 mg total) by mouth at bedtime for 3 days. 08/03/20 08/06/20  Zigmund Daniel., MD    Physical Exam: Vitals:   02/04/21 1947 02/04/21 2113 02/04/21 2200 02/04/21 2240  BP: (!) 185/96  (!) 161/79 (!) 155/66  Pulse: (!) 112 (!) 112 (!) 115 88  Resp: 19 16 (!) 29 (!) 23  Temp:    98.2 F (36.8 C)  TempSrc:    Oral  SpO2: 94% 95% 92% 97%  Weight:      Height:        Constitutional: NAD, calm, comfortable Eyes: PERRL, lids and conjunctivae normal ENMT: Mucous  membranes are moist. Posterior pharynx clear of any exudate or lesions.Normal dentition.  Neck: normal, supple, no masses, no thyromegaly Respiratory: clear to auscultation bilaterally, no wheezing, no crackles. Normal respiratory effort. No accessory muscle use.  Cardiovascular: Regular rate and rhythm, no murmurs / rubs / gallops. No extremity edema. 2+ pedal pulses. No carotid bruits.  Abdomen: no tenderness, no masses palpated. No hepatosplenomegaly. Bowel sounds positive.  Musculoskeletal: Left leg is shortened and externally rotated Skin: no rashes, lesions, ulcers. No induration Neurologic: Grossly normal Psychiatric: Normal judgment and insight. Alert and oriented x 3. Normal mood.   Labs on Admission: I have personally reviewed following labs and imaging studies  CBC: Recent Labs  Lab 02/04/21 2015  WBC 9.9  NEUTROABS 8.3*  HGB 12.7*  HCT 38.3*  MCV 95.5  PLT 189   Basic Metabolic Panel: Recent Labs  Lab 02/04/21 2015  NA 142  K 3.6  CL 107  CO2 27  GLUCOSE 134*  BUN 13  CREATININE 0.88  CALCIUM 9.6    Coagulation Profile: Recent Labs  Lab 02/04/21 2015  INR 1.0    Urine analysis:    Component Value Date/Time   COLORURINE RED (A) 07/30/2020 0804   APPEARANCEUR TURBID (A) 07/30/2020 0804   LABSPEC  07/30/2020 0804    TEST NOT REPORTED DUE TO COLOR INTERFERENCE OF URINE PIGMENT   PHURINE  07/30/2020 0804    TEST NOT REPORTED DUE TO COLOR INTERFERENCE OF URINE PIGMENT   GLUCOSEU (A) 07/30/2020 0804    TEST NOT REPORTED DUE TO COLOR INTERFERENCE OF URINE PIGMENT   HGBUR (A) 07/30/2020 0804    TEST NOT REPORTED DUE TO COLOR INTERFERENCE OF URINE PIGMENT   BILIRUBINUR (A) 07/30/2020 0804    TEST NOT REPORTED DUE TO COLOR INTERFERENCE OF URINE PIGMENT   KETONESUR (A) 07/30/2020 0804    TEST NOT REPORTED DUE TO COLOR INTERFERENCE OF URINE PIGMENT   PROTEINUR (A) 07/30/2020 0804    TEST NOT REPORTED DUE TO COLOR INTERFERENCE OF URINE PIGMENT   NITRITE  (A) 07/30/2020 0804    TEST NOT REPORTED DUE TO COLOR INTERFERENCE OF URINE PIGMENT   LEUKOCYTESUR (A) 07/30/2020 0804    TEST NOT REPORTED DUE TO COLOR INTERFERENCE OF URINE PIGMENT    Radiological Exams on Admission: DG Hip Unilat W or Wo Pelvis 2-3 Views Left  Result Date: 02/04/2021 CLINICAL DATA:  Hip pain after fall EXAM: DG HIP (WITH OR WITHOUT PELVIS) 2-3V LEFT COMPARISON:  None. FINDINGS: Pubic symphysis appears intact. The SI joints are non widened. Acute displaced basicervical fracture without femoral head dislocation. IMPRESSION: Acute displaced basicervical fracture without femoral head displacement. Electronically Signed   By: Jasmine Pang M.D.   On: 02/04/2021 19:05  EKG: Independently reviewed.  Sinus arrhythmia, PACs, frequent PVCs, ST depression, prolonged QT  Assessment/Plan Active Problems:   Essential hypertension   Seizure (HCC)   S/p left hip fracture   Prediabetes   BPH (benign prostatic hyperplasia)  Status post left hip fracture For orthopedics n.p.o. after midnight To the OR for repair in the a.m. Holding anticoagulation until post surgery  Frequent PVCs and tachycardia Check troponins Cycle EKG Check TSH  Essential hypertension Continue Lotensin  Prediabetes Monitor CBGs  BPH Continue Proscar and Flomax  History of seizure following benzodiazepine withdrawal Continue lorazepam 2 mg 3 times daily   DVT prophylaxis: SCD/Compression stockings Code Status: DNR confirmed with patient Family Communication: Patient at bedside Disposition Plan: Probable rehab Consults called: Ortho per EDP Admission status: Inpatient patient is inpatient status due to need for acute surgical management   Reva Bores MD [facility]  If 7PM-7AM, please contact night-coverage 02/05/2021, 12:20 AM

## 2021-02-04 NOTE — ED Notes (Signed)
Provider notified of PVCs and Hr jumping up to 140s

## 2021-02-04 NOTE — Plan of Care (Signed)
Plan of care discussed.   

## 2021-02-05 ENCOUNTER — Inpatient Hospital Stay (HOSPITAL_COMMUNITY): Payer: Medicare Other | Admitting: Certified Registered Nurse Anesthetist

## 2021-02-05 ENCOUNTER — Other Ambulatory Visit: Payer: Self-pay

## 2021-02-05 ENCOUNTER — Inpatient Hospital Stay: Admit: 2021-02-05 | Payer: Medicare Other | Admitting: Orthopedic Surgery

## 2021-02-05 ENCOUNTER — Inpatient Hospital Stay (HOSPITAL_COMMUNITY): Payer: Medicare Other

## 2021-02-05 ENCOUNTER — Encounter (HOSPITAL_COMMUNITY): Payer: Self-pay | Admitting: Family Medicine

## 2021-02-05 ENCOUNTER — Encounter (HOSPITAL_COMMUNITY)
Admission: EM | Disposition: A | Discharge status: Rehabilitation Facility | Payer: Self-pay | Source: Skilled Nursing Facility | Attending: Internal Medicine

## 2021-02-05 DIAGNOSIS — Z8781 Personal history of (healed) traumatic fracture: Secondary | ICD-10-CM | POA: Diagnosis not present

## 2021-02-05 DIAGNOSIS — R7303 Prediabetes: Secondary | ICD-10-CM

## 2021-02-05 DIAGNOSIS — N400 Enlarged prostate without lower urinary tract symptoms: Secondary | ICD-10-CM

## 2021-02-05 HISTORY — PX: FEMUR IM NAIL: SHX1597

## 2021-02-05 LAB — SURGICAL PCR SCREEN
MRSA, PCR: NEGATIVE
Staphylococcus aureus: NEGATIVE

## 2021-02-05 LAB — CBC
HCT: 33.7 % — ABNORMAL LOW (ref 39.0–52.0)
Hemoglobin: 11.4 g/dL — ABNORMAL LOW (ref 13.0–17.0)
MCH: 32 pg (ref 26.0–34.0)
MCHC: 33.8 g/dL (ref 30.0–36.0)
MCV: 94.7 fL (ref 80.0–100.0)
Platelets: 174 10*3/uL (ref 150–400)
RBC: 3.56 MIL/uL — ABNORMAL LOW (ref 4.22–5.81)
RDW: 13.1 % (ref 11.5–15.5)
WBC: 9.8 10*3/uL (ref 4.0–10.5)
nRBC: 0 % (ref 0.0–0.2)

## 2021-02-05 LAB — GLUCOSE, CAPILLARY
Glucose-Capillary: 120 mg/dL — ABNORMAL HIGH (ref 70–99)
Glucose-Capillary: 324 mg/dL — ABNORMAL HIGH (ref 70–99)
Glucose-Capillary: 209 mg/dL — ABNORMAL HIGH (ref 70–99)

## 2021-02-05 LAB — BASIC METABOLIC PANEL
Anion gap: 10 (ref 5–15)
BUN: 13 mg/dL (ref 8–23)
CO2: 26 mmol/L (ref 22–32)
Calcium: 8.6 mg/dL — ABNORMAL LOW (ref 8.9–10.3)
Chloride: 102 mmol/L (ref 98–111)
Creatinine, Ser: 0.94 mg/dL (ref 0.61–1.24)
GFR, Estimated: >60 mL/min (ref >60)
Glucose, Bld: 156 mg/dL — ABNORMAL HIGH (ref 70–99)
Potassium: 3.7 mmol/L (ref 3.5–5.1)
Sodium: 138 mmol/L (ref 135–145)

## 2021-02-05 LAB — TROPONIN I (HIGH SENSITIVITY)
Troponin I (High Sensitivity): 14 ng/L (ref <18)
Troponin I (High Sensitivity): 16 ng/L (ref <18)

## 2021-02-05 LAB — TSH: TSH: 1.011 u[IU]/mL (ref 0.350–4.500)

## 2021-02-05 LAB — HEMOGLOBIN A1C
Hgb A1c MFr Bld: 5.8 % — ABNORMAL HIGH (ref 4.8–5.6)
Mean Plasma Glucose: 119.76 mg/dL

## 2021-02-05 SURGERY — INSERTION, INTRAMEDULLARY ROD, FEMUR
Anesthesia: General | Site: Hip | Laterality: Left

## 2021-02-05 MED ORDER — INSULIN ASPART 100 UNIT/ML IJ SOLN
0.0000 [IU] | Freq: Three times a day (TID) | INTRAMUSCULAR | Status: DC
  Administered 2021-02-06: 5 [IU] via SUBCUTANEOUS
  Administered 2021-02-07: 2 [IU] via SUBCUTANEOUS
  Administered 2021-02-07 (×2): 3 [IU] via SUBCUTANEOUS
  Administered 2021-02-08: 2 [IU] via SUBCUTANEOUS
  Administered 2021-02-08: 5 [IU] via SUBCUTANEOUS

## 2021-02-05 MED ORDER — EPHEDRINE 5 MG/ML INJ
INTRAVENOUS | Status: AC
  Filled 2021-02-05: qty 5

## 2021-02-05 MED ORDER — FENTANYL CITRATE PF 50 MCG/ML IJ SOSY: 25.0000 ug | PREFILLED_SYRINGE | INTRAMUSCULAR | Status: DC | PRN

## 2021-02-05 MED ORDER — ROCURONIUM BROMIDE 10 MG/ML (PF) SYRINGE
PREFILLED_SYRINGE | INTRAVENOUS | Status: AC
  Filled 2021-02-05: qty 10

## 2021-02-05 MED ORDER — DEXAMETHASONE SODIUM PHOSPHATE 10 MG/ML IJ SOLN
INTRAMUSCULAR | Status: DC | PRN
  Administered 2021-02-05: 5 mg via INTRAVENOUS

## 2021-02-05 MED ORDER — FENTANYL CITRATE PF 50 MCG/ML IJ SOSY
PREFILLED_SYRINGE | INTRAMUSCULAR | Status: AC
  Filled 2021-02-05: qty 2

## 2021-02-05 MED ORDER — METOCLOPRAMIDE HCL 5 MG/ML IJ SOLN
5.0000 mg | Freq: Three times a day (TID) | INTRAMUSCULAR | Status: DC | PRN
Start: 2021-02-05 — End: 2021-02-08

## 2021-02-05 MED ORDER — ONDANSETRON HCL 4 MG/2ML IJ SOLN: 4.0000 mg | Freq: Four times a day (QID) | INTRAMUSCULAR | Status: DC | PRN

## 2021-02-05 MED ORDER — BISACODYL 10 MG RE SUPP: 10.0000 mg | Freq: Every day | RECTAL | Status: DC | PRN

## 2021-02-05 MED ORDER — SODIUM CHLORIDE 0.9 % IR SOLN
Status: DC | PRN
  Administered 2021-02-05: 1000 mL

## 2021-02-05 MED ORDER — METHOCARBAMOL 1000 MG/10ML IJ SOLN
500.0000 mg | Freq: Four times a day (QID) | INTRAVENOUS | Status: DC | PRN
  Filled 2021-02-05: qty 5

## 2021-02-05 MED ORDER — TRANEXAMIC ACID-NACL 1000-0.7 MG/100ML-% IV SOLN
1000.0000 mg | INTRAVENOUS | Status: AC
  Administered 2021-02-05: 1000 mg via INTRAVENOUS

## 2021-02-05 MED ORDER — ROCURONIUM BROMIDE 100 MG/10ML IV SOLN
INTRAVENOUS | Status: DC | PRN
  Administered 2021-02-05: 30 mg via INTRAVENOUS

## 2021-02-05 MED ORDER — FENTANYL CITRATE (PF) 100 MCG/2ML IJ SOLN
INTRAMUSCULAR | Status: DC | PRN
  Administered 2021-02-05: 25 ug via INTRAVENOUS
  Administered 2021-02-05: 50 ug via INTRAVENOUS
  Administered 2021-02-05: 25 ug via INTRAVENOUS

## 2021-02-05 MED ORDER — DOCUSATE SODIUM 100 MG PO CAPS
100.0000 mg | ORAL_CAPSULE | Freq: Two times a day (BID) | ORAL | Status: DC
  Administered 2021-02-05 – 2021-02-08 (×7): 100 mg via ORAL
  Filled 2021-02-05 (×7): qty 1

## 2021-02-05 MED ORDER — PHENYLEPHRINE 40 MCG/ML (10ML) SYRINGE FOR IV PUSH (FOR BLOOD PRESSURE SUPPORT)
PREFILLED_SYRINGE | INTRAVENOUS | Status: AC
  Filled 2021-02-05: qty 10

## 2021-02-05 MED ORDER — PHENYLEPHRINE HCL-NACL 20-0.9 MG/250ML-% IV SOLN
INTRAVENOUS | Status: AC
  Filled 2021-02-05: qty 250

## 2021-02-05 MED ORDER — HYDROCODONE-ACETAMINOPHEN 7.5-325 MG PO TABS: 1.0000 | ORAL_TABLET | ORAL | Status: DC | PRN

## 2021-02-05 MED ORDER — HYDROCODONE-ACETAMINOPHEN 5-325 MG PO TABS
1.0000 | ORAL_TABLET | ORAL | Status: DC | PRN
  Administered 2021-02-06 – 2021-02-08 (×5): 1 via ORAL
  Filled 2021-02-05 (×5): qty 1

## 2021-02-05 MED ORDER — LORAZEPAM 1 MG PO TABS
2.0000 mg | ORAL_TABLET | Freq: Every evening | ORAL | Status: DC | PRN
  Administered 2021-02-05 – 2021-02-06 (×2): 2 mg via ORAL
  Filled 2021-02-05 (×2): qty 2

## 2021-02-05 MED ORDER — CEFAZOLIN SODIUM-DEXTROSE 2-4 GM/100ML-% IV SOLN
2.0000 g | INTRAVENOUS | Status: AC
  Administered 2021-02-05: 2 g via INTRAVENOUS

## 2021-02-05 MED ORDER — PHENOL 1.4 % MT LIQD: 1.0000 | OROMUCOSAL | Status: DC | PRN

## 2021-02-05 MED ORDER — ACETAMINOPHEN 10 MG/ML IV SOLN
INTRAVENOUS | Status: AC
  Filled 2021-02-05: qty 100

## 2021-02-05 MED ORDER — PROPOFOL 10 MG/ML IV BOLUS
INTRAVENOUS | Status: DC | PRN
  Administered 2021-02-05: 100 mg via INTRAVENOUS

## 2021-02-05 MED ORDER — CEFAZOLIN SODIUM-DEXTROSE 2-4 GM/100ML-% IV SOLN
INTRAVENOUS | Status: AC
  Filled 2021-02-05: qty 100

## 2021-02-05 MED ORDER — DEXAMETHASONE SODIUM PHOSPHATE 10 MG/ML IJ SOLN
INTRAMUSCULAR | Status: AC
  Filled 2021-02-05: qty 1

## 2021-02-05 MED ORDER — ONDANSETRON HCL 4 MG/2ML IJ SOLN
INTRAMUSCULAR | Status: DC | PRN
  Administered 2021-02-05: 4 mg via INTRAVENOUS

## 2021-02-05 MED ORDER — MENTHOL 3 MG MT LOZG: 1.0000 | LOZENGE | OROMUCOSAL | Status: DC | PRN

## 2021-02-05 MED ORDER — LACTATED RINGERS IV SOLN: INTRAVENOUS | Status: DC | PRN

## 2021-02-05 MED ORDER — AMISULPRIDE (ANTIEMETIC) 5 MG/2ML IV SOLN: 10.0000 mg | Freq: Once | INTRAVENOUS | Status: DC | PRN

## 2021-02-05 MED ORDER — DEXAMETHASONE SODIUM PHOSPHATE 10 MG/ML IJ SOLN: 8.0000 mg | Freq: Once | INTRAMUSCULAR | Status: DC

## 2021-02-05 MED ORDER — ENSURE ENLIVE PO LIQD
237.0000 mL | Freq: Two times a day (BID) | ORAL | Status: DC
  Administered 2021-02-05 – 2021-02-08 (×6): 237 mL via ORAL

## 2021-02-05 MED ORDER — MORPHINE SULFATE (PF) 2 MG/ML IV SOLN: 0.5000 mg | INTRAVENOUS | Status: DC | PRN

## 2021-02-05 MED ORDER — ONDANSETRON HCL 4 MG/2ML IJ SOLN
INTRAMUSCULAR | Status: AC
  Filled 2021-02-05: qty 2

## 2021-02-05 MED ORDER — METOCLOPRAMIDE HCL 5 MG PO TABS: 5.0000 mg | ORAL_TABLET | Freq: Three times a day (TID) | ORAL | Status: DC | PRN

## 2021-02-05 MED ORDER — ACETAMINOPHEN 325 MG PO TABS: 325.0000 mg | ORAL_TABLET | Freq: Four times a day (QID) | ORAL | Status: DC | PRN

## 2021-02-05 MED ORDER — ONDANSETRON HCL 4 MG PO TABS: 4.0000 mg | ORAL_TABLET | Freq: Four times a day (QID) | ORAL | Status: DC | PRN

## 2021-02-05 MED ORDER — METHOCARBAMOL 500 MG PO TABS
500.0000 mg | ORAL_TABLET | Freq: Four times a day (QID) | ORAL | Status: DC | PRN
  Administered 2021-02-05 – 2021-02-06 (×5): 500 mg via ORAL
  Filled 2021-02-05 (×5): qty 1

## 2021-02-05 MED ORDER — ACETAMINOPHEN 500 MG PO TABS
500.0000 mg | ORAL_TABLET | Freq: Four times a day (QID) | ORAL | Status: AC
  Administered 2021-02-05 – 2021-02-06 (×3): 500 mg via ORAL
  Filled 2021-02-05 (×3): qty 1

## 2021-02-05 MED ORDER — ACETAMINOPHEN 10 MG/ML IV SOLN
1000.0000 mg | Freq: Once | INTRAVENOUS | Status: DC | PRN
  Administered 2021-02-05: 1000 mg via INTRAVENOUS

## 2021-02-05 MED ORDER — MUPIROCIN 2 % EX OINT
1.0000 "application " | TOPICAL_OINTMENT | Freq: Two times a day (BID) | CUTANEOUS | Status: DC
  Administered 2021-02-05: 1 via NASAL
  Filled 2021-02-05: qty 22

## 2021-02-05 MED ORDER — POVIDONE-IODINE 10 % EX SWAB: 2.0000 "application " | Freq: Once | CUTANEOUS | Status: DC

## 2021-02-05 MED ORDER — PANTOPRAZOLE SODIUM 40 MG PO TBEC
40.0000 mg | DELAYED_RELEASE_TABLET | Freq: Every day | ORAL | Status: DC
  Administered 2021-02-05 – 2021-02-08 (×4): 40 mg via ORAL
  Filled 2021-02-05 (×4): qty 1

## 2021-02-05 MED ORDER — LIDOCAINE HCL (CARDIAC) PF 100 MG/5ML IV SOSY
PREFILLED_SYRINGE | INTRAVENOUS | Status: DC | PRN
  Administered 2021-02-05: 40 mg via INTRAVENOUS

## 2021-02-05 MED ORDER — STERILE WATER FOR IRRIGATION IR SOLN
Status: DC | PRN
  Administered 2021-02-05: 2000 mL

## 2021-02-05 MED ORDER — EPHEDRINE SULFATE-NACL 50-0.9 MG/10ML-% IV SOSY
PREFILLED_SYRINGE | INTRAVENOUS | Status: DC | PRN
  Administered 2021-02-05 (×3): 5 mg via INTRAVENOUS

## 2021-02-05 MED ORDER — PHENYLEPHRINE 40 MCG/ML (10ML) SYRINGE FOR IV PUSH (FOR BLOOD PRESSURE SUPPORT)
PREFILLED_SYRINGE | INTRAVENOUS | Status: DC | PRN
  Administered 2021-02-05: 80 ug via INTRAVENOUS
  Administered 2021-02-05 (×2): 120 ug via INTRAVENOUS
  Administered 2021-02-05 (×2): 80 ug via INTRAVENOUS

## 2021-02-05 MED ORDER — ENOXAPARIN SODIUM 40 MG/0.4ML IJ SOSY
40.0000 mg | PREFILLED_SYRINGE | INTRAMUSCULAR | Status: DC
  Administered 2021-02-05 – 2021-02-06 (×2): 40 mg via SUBCUTANEOUS
  Filled 2021-02-05 (×2): qty 0.4

## 2021-02-05 MED ORDER — INSULIN ASPART 100 UNIT/ML IJ SOLN
0.0000 [IU] | Freq: Every day | INTRAMUSCULAR | Status: DC
Start: 2021-02-05 — End: 2021-02-08
  Administered 2021-02-07: 2 [IU] via SUBCUTANEOUS

## 2021-02-05 MED ORDER — ACETAMINOPHEN 500 MG PO TABS: 1000.0000 mg | ORAL_TABLET | Freq: Once | ORAL | Status: DC

## 2021-02-05 MED ORDER — CEFAZOLIN SODIUM-DEXTROSE 2-4 GM/100ML-% IV SOLN
2.0000 g | Freq: Four times a day (QID) | INTRAVENOUS | Status: AC
Start: 2021-02-05 — End: 2021-02-05
  Administered 2021-02-05 (×2): 2 g via INTRAVENOUS
  Filled 2021-02-05 (×2): qty 100

## 2021-02-05 MED ORDER — LIDOCAINE HCL (PF) 2 % IJ SOLN
INTRAMUSCULAR | Status: AC
  Filled 2021-02-05: qty 5

## 2021-02-05 MED ORDER — PROPOFOL 1000 MG/100ML IV EMUL
INTRAVENOUS | Status: AC
  Filled 2021-02-05: qty 100

## 2021-02-05 MED ORDER — TRANEXAMIC ACID-NACL 1000-0.7 MG/100ML-% IV SOLN
1000.0000 mg | Freq: Once | INTRAVENOUS | Status: AC
  Administered 2021-02-05: 1000 mg via INTRAVENOUS
  Filled 2021-02-05: qty 100

## 2021-02-05 MED ORDER — ONDANSETRON HCL 4 MG/2ML IJ SOLN: 4.0000 mg | Freq: Once | INTRAMUSCULAR | Status: DC | PRN

## 2021-02-05 MED ORDER — POLYETHYLENE GLYCOL 3350 17 G PO PACK: 17.0000 g | PACK | Freq: Every day | ORAL | Status: DC | PRN

## 2021-02-05 MED ORDER — FENTANYL CITRATE PF 50 MCG/ML IJ SOSY
PREFILLED_SYRINGE | INTRAMUSCULAR | Status: AC
  Filled 2021-02-05: qty 1

## 2021-02-05 MED ORDER — SUGAMMADEX SODIUM 200 MG/2ML IV SOLN
INTRAVENOUS | Status: DC | PRN
  Administered 2021-02-05: 150 mg via INTRAVENOUS

## 2021-02-05 MED ORDER — FENTANYL CITRATE (PF) 100 MCG/2ML IJ SOLN
INTRAMUSCULAR | Status: AC
  Filled 2021-02-05: qty 2

## 2021-02-05 MED ORDER — ALUM & MAG HYDROXIDE-SIMETH 200-200-20 MG/5ML PO SUSP: 30.0000 mL | ORAL | Status: DC | PRN

## 2021-02-05 MED ORDER — TRANEXAMIC ACID-NACL 1000-0.7 MG/100ML-% IV SOLN
INTRAVENOUS | Status: AC
  Filled 2021-02-05: qty 100

## 2021-02-05 SURGICAL SUPPLY — 37 items
BAG COUNTER SPONGE SURGICOUNT (BAG) ×2 IMPLANT
BIT DRILL AO GAMMA 4.2X180 (BIT) ×2 IMPLANT
CLSR STERI-STRIP ANTIMIC 1/2X4 (GAUZE/BANDAGES/DRESSINGS) ×2 IMPLANT
COVER MAYO STAND STRL (DRAPES) ×2 IMPLANT
COVER PERINEAL POST (MISCELLANEOUS) ×2 IMPLANT
COVER SURGICAL LIGHT HANDLE (MISCELLANEOUS) ×2 IMPLANT
DRAPE STERI IOBAN 125X83 (DRAPES) ×2 IMPLANT
DRESSING MEPILEX FLEX 4X4 (GAUZE/BANDAGES/DRESSINGS) ×2 IMPLANT
DRSG MEPILEX FLEX 4X4 (GAUZE/BANDAGES/DRESSINGS) ×4
DURAPREP 26ML APPLICATOR (WOUND CARE) ×2 IMPLANT
ELECT REM PT RETURN 15FT ADLT (MISCELLANEOUS) ×2 IMPLANT
GLOVE SRG 8 PF TXTR STRL LF DI (GLOVE) ×1 IMPLANT
GLOVE SURG ENC MOIS LTX SZ7.5 (GLOVE) ×2 IMPLANT
GLOVE SURG POLYISO LF SZ7.5 (GLOVE) ×2 IMPLANT
GLOVE SURG UNDER POLY LF SZ7.5 (GLOVE) ×2 IMPLANT
GLOVE SURG UNDER POLY LF SZ8 (GLOVE) ×1
GOWN STRL REUS W/TWL LRG LVL3 (GOWN DISPOSABLE) ×4 IMPLANT
GOWN STRL REUS W/TWL XL LVL3 (GOWN DISPOSABLE) ×4 IMPLANT
GUIDEROD T2 3X1000 (ROD) ×2 IMPLANT
K-WIRE  3.2X450M STR (WIRE) ×1
K-WIRE 3.2X450M STR (WIRE) ×1
KIT BASIN OR (CUSTOM PROCEDURE TRAY) ×2 IMPLANT
KIT TURNOVER KIT A (KITS) ×2 IMPLANT
KWIRE 3.2X450M STR (WIRE) ×1 IMPLANT
MANIFOLD NEPTUNE II (INSTRUMENTS) ×2 IMPLANT
NAIL GAMMA 10X420X125 (Nail) ×2 IMPLANT
NS IRRIG 1000ML POUR BTL (IV SOLUTION) ×2 IMPLANT
PACK GENERAL/GYN (CUSTOM PROCEDURE TRAY) ×2 IMPLANT
PAD ARMBOARD 7.5X6 YLW CONV (MISCELLANEOUS) ×4 IMPLANT
SCREW LAG GAMMA 3 TI 10.5X100M (Screw) ×2 IMPLANT
STRIP CLOSURE SKIN 1/2X4 (GAUZE/BANDAGES/DRESSINGS) ×2 IMPLANT
SUT MNCRL AB 4-0 PS2 18 (SUTURE) ×2 IMPLANT
SUT VIC AB 2-0 CT1 27 (SUTURE) ×1
SUT VIC AB 2-0 CT1 TAPERPNT 27 (SUTURE) ×1 IMPLANT
TOWEL OR 17X26 10 PK STRL BLUE (TOWEL DISPOSABLE) ×2 IMPLANT
TOWEL OR NON WOVEN STRL DISP B (DISPOSABLE) ×2 IMPLANT
WATER STERILE IRR 1000ML POUR (IV SOLUTION) ×2 IMPLANT

## 2021-02-05 NOTE — Interval H&P Note (Signed)
History and Physical Interval Note:  02/05/2021 7:42 AM  Russell Ayers  has presented today for surgery, with the diagnosis of left hip fracture.  The various methods of treatment have been discussed with the patient and family. After consideration of risks, benefits and other options for treatment, the patient has consented to  Procedure(s): INTRAMEDULLARY (IM) NAIL FEMORAL (Left) as a surgical intervention.  The patient's history has been reviewed, patient examined, no change in status, stable for surgery.  I have reviewed the patient's chart and labs.  Questions were answered to the patient's satisfaction.     Sheral Apley

## 2021-02-05 NOTE — Anesthesia Preprocedure Evaluation (Addendum)
Anesthesia Evaluation  Patient identified by MRN, date of birth, ID band Patient awake    Reviewed: Allergy & Precautions, NPO status , Patient's Chart, lab work & pertinent test results  Airway Mallampati: III  TM Distance: >3 FB Neck ROM: Full    Dental  (+) Missing   Pulmonary former smoker,    Pulmonary exam normal breath sounds clear to auscultation       Cardiovascular hypertension, Pt. on medications and Pt. on home beta blockers Normal cardiovascular exam Rhythm:Regular Rate:Normal  ECG: NSR, rate 79   Neuro/Psych Seizures -, Well Controlled,  PSYCHIATRIC DISORDERS Anxiety    GI/Hepatic negative GI ROS, Neg liver ROS,   Endo/Other    Renal/GU negative Renal ROS     Musculoskeletal negative musculoskeletal ROS (+)   Abdominal   Peds  Hematology  (+) anemia ,   Anesthesia Other Findings  left hip fracture  Reproductive/Obstetrics                            Anesthesia Physical Anesthesia Plan  ASA: 3  Anesthesia Plan: General   Post-op Pain Management:    Induction: Intravenous  PONV Risk Score and Plan: 2 and Ondansetron, Dexamethasone and Treatment may vary due to age or medical condition  Airway Management Planned: Oral ETT  Additional Equipment:   Intra-op Plan:   Post-operative Plan: Extubation in OR  Informed Consent: I have reviewed the patients History and Physical, chart, labs and discussed the procedure including the risks, benefits and alternatives for the proposed anesthesia with the patient or authorized representative who has indicated his/her understanding and acceptance.   Patient has DNR.  Discussed DNR with patient and Suspend DNR.   Dental advisory given  Plan Discussed with: CRNA  Anesthesia Plan Comments:        Anesthesia Quick Evaluation

## 2021-02-05 NOTE — Progress Notes (Signed)
Pt off unit to PACU in stable condition. No needs at time of transport.

## 2021-02-05 NOTE — Progress Notes (Signed)
Pt back from surgery with no needs. Pt is on 2lnc sating 94 percent. Assessment and vs performed. Pt surgical dressing clean dry and intact.

## 2021-02-05 NOTE — Progress Notes (Signed)
Md notified of patient and patient daughter concerns that not on his ativan that he takes at home for sleep. According to the family this caused him to have delirium last time he was in the hospital (not having the ativan). They also state that patient is easily constipated and takes miralax daily to keep himself regular. Rn communicated these concerns to md and will continue to monitor.

## 2021-02-05 NOTE — Transfer of Care (Signed)
Immediate Anesthesia Transfer of Care Note  Patient: Russell Ayers  Procedure(s) Performed: INTRAMEDULLARY (IM) NAIL FEMORAL (Left: Hip)  Patient Location: PACU  Anesthesia Type:General  Level of Consciousness: awake, alert , oriented and patient cooperative  Airway & Oxygen Therapy: Patient Spontanous Breathing  Post-op Assessment: Report given to RN and Post -op Vital signs reviewed and stable  Post vital signs: Reviewed and stable  Last Vitals:  Vitals Value Taken Time  BP 129/55 02/05/21 0857  Temp    Pulse 81 02/05/21 0858  Resp 21 02/05/21 0858  SpO2 97 % 02/05/21 0859  Vitals shown include unvalidated device data.  Last Pain:  Vitals:   02/05/21 0734  TempSrc:   PainSc: 0-No pain      Patients Stated Pain Goal: 2 (02/04/21 2240)  Complications: No notable events documented.

## 2021-02-05 NOTE — H&P (View-Only) (Signed)
ORTHOPAEDIC CONSULTATION  REQUESTING PHYSICIAN: Shelly Coss, MD  Time called na Time arrived na  Chief Complaint: left hip fracture  HPI: Russell Ayers is a 87 y.o. male I have reviewed and agree with below history  Russell Ayers is a 87 y.o. male with medical history significant of HTN, borderline DM, previous seizure following benzodiazepine withdrawal who ambulates with a cane who went out a door this morning and fell.  He does not recall any presyncopal symptoms or tripping.  He fell to the sidewalk and hurt his hip.  He does not have any loss of consciousness.   Past Medical History:  Diagnosis Date   Diabetes mellitus without complication (HCC)    Generalized anxiety disorder    Glaucoma    Hypertension    Inguinal hernia    Insomnia    Prostatism    Stroke [facility])    Past Surgical History:  Procedure Laterality Date   CATARACT EXTRACTION, BILATERAL     glaucoma     Social History   Socioeconomic History   Marital status: Divorced    Spouse name: Not on file   Number of children: 1   Years of education: Not on file   Highest education level: Not on file  Occupational History   Occupation: retired Engineer, technical sales  Tobacco Use   Smoking status: Former    Types: Cigarettes    Quit date: 02/18/1989    Years since quitting: 31.9   Smokeless tobacco: Never  Substance and Sexual Activity   Alcohol use: Never   Drug use: Never   Sexual activity: Not on file  Other Topics Concern   Not on file  Social History Narrative   Not on file   Social Determinants of Health   Financial Resource Strain: Not on file  Food Insecurity: Not on file  Transportation Needs: Not on file  Physical Activity: Not on file  Stress: Not on file  Social Connections: Not on file   Family History  Problem Relation Age of Onset   Cancer Brother    Heart attack Brother    No Known Allergies Prior to Admission medications   Medication Sig Start Date End Date Taking?  Authorizing Provider  benazepril (LOTENSIN) 10 MG tablet Take 10 mg by mouth daily. 10/20/19  Yes [provider]  diphenhydrAMINE (BENADRYL) 25 MG tablet Take 50 mg by mouth in the morning and at bedtime.   Yes [provider]  finasteride (PROSCAR) 5 MG tablet Take 5 mg by mouth daily. 09/30/18  Yes [provider]  fluticasone (FLONASE) 50 MCG/ACT nasal spray Place 1-2 sprays into both nostrils daily as needed for allergies.   Yes [provider]  latanoprost (XALATAN) 0.005 % ophthalmic solution Place 1 drop into both eyes at bedtime. 11/20/18  Yes [provider]  LORazepam (ATIVAN) 2 MG tablet Take 2 mg by mouth at bedtime as needed for sleep. 02/04/21  Yes [provider]  Melatonin 10 MG TABS Take 40-60 mg by mouth at bedtime as needed (sleep).   Yes [provider]  Nutritional Supplements (PROSTATE PO) Take 1 capsule by mouth every evening.   Yes [provider]  Omega-3 Fatty Acids (FISH OIL PO) Take 1 capsule by mouth once a week.   Yes [provider]  PSYLLIUM HUSK PO Take 1 tablet by mouth every evening.   Yes [provider]  tamsulosin (FLOMAX) 0.4 MG CAPS capsule Take 0.4 mg by mouth  every evening. 11/25/20  Yes [provider]  temazepam (RESTORIL) 30 MG capsule Take 30 mg by mouth at bedtime.   Yes [provider]  timolol (BETIMOL) 0.5 % ophthalmic solution Place 1 drop into both eyes every evening.   Yes [provider]  vitamin B-12 (CYANOCOBALAMIN) 100 MCG tablet Take 100 mcg by mouth daily.   Yes [provider]  temazepam (RESTORIL) 30 MG capsule Take 1 capsule (30 mg total) by mouth at bedtime for 3 days. 08/03/20 08/06/20  Russell Ayers., MD   DG Hip Kaylyn Layer W or Wo Pelvis 2-3 Views Left  Result Date: 02/04/2021 CLINICAL DATA:  Hip pain after fall EXAM: DG HIP (WITH OR WITHOUT PELVIS) 2-3V LEFT COMPARISON:  None. FINDINGS: Pubic symphysis appears  intact. The SI joints are non widened. Acute displaced basicervical fracture without femoral head dislocation. IMPRESSION: Acute displaced basicervical fracture without femoral head displacement. Electronically Signed   By: Donavan Foil M.D.   On: 02/04/2021 19:05    Positive ROS: All other systems have been reviewed and were otherwise negative with the exception of those mentioned in the HPI and as above.  Labs cbc Recent Labs    02/04/21 2015 02/05/21 0122  WBC 9.9 9.8  HGB 12.7* 11.4*  HCT 38.3* 33.7*  PLT 189 174    Labs inflam No results for input(s): CRP in the last 72 hours.  Invalid input(s): ESR  Labs coag Recent Labs    02/04/21 2015  INR 1.0    Recent Labs    02/04/21 2015 02/05/21 0122  NA 142 138  K 3.6 3.7  CL 107 102  CO2 27 26  GLUCOSE 134* 156*  BUN 13 13  CREATININE 0.88 0.94  CALCIUM 9.6 8.6*    Physical Exam: Vitals:   02/05/21 0252 02/05/21 0611  BP: 110/68 131/67  Pulse: 86 84  Resp: 16 16  Temp: 97.7 F (36.5 C) 97.7 F (36.5 C)  SpO2: 99% 100%   General: Alert, no acute distress Cardiovascular: No pedal edema Respiratory: No cyanosis, no use of accessory musculature GI: No organomegaly, abdomen is soft and non-tender Skin: No lesions in the area of chief complaint other than those listed below in MSK exam.  Neurologic: Sensation intact distally save for the below mentioned MSK exam Psychiatric: Patient is competent for consent with normal mood and affect Lymphatic: No axillary or cervical lymphadenopathy  MUSCULOSKELETAL:  Pain at the L hip, NVI, compartments soft Other extremities are atraumatic with painless ROM and NVI.  Assessment: Left basicervical hip fx.  Plan: IM nail today I have discussed the risks and benefits with him and he wishes to proceed.    Renette Butters, MD    02/05/2021 7:35 AM

## 2021-02-05 NOTE — Anesthesia Procedure Notes (Signed)
Procedure Name: Intubation Date/Time: 02/05/2021 7:46 AM Performed by: Yolonda Kida, CRNA Pre-anesthesia Checklist: Patient identified, Emergency Drugs available, Suction available and Patient being monitored Patient Re-evaluated:Patient Re-evaluated prior to induction Oxygen Delivery Method: Circle system utilized Preoxygenation: Pre-oxygenation with 100% oxygen Induction Type: IV induction Ventilation: Mask ventilation without difficulty Laryngoscope Size: Miller and 3 Grade View: Grade I Tube type: Oral Tube size: 7.0 mm Number of attempts: 1 Airway Equipment and Method: Stylet Placement Confirmation: ETT inserted through vocal cords under direct vision, positive ETCO2 and breath sounds checked- equal and bilateral Secured at: 22 cm Dental Injury: Teeth and Oropharynx as per pre-operative assessment

## 2021-02-05 NOTE — Op Note (Signed)
DATE OF SURGERY:  02/05/2021  TIME: 8:33 AM  PATIENT NAME:  Russell Ayers  AGE: 87 y.o.  PRE-OPERATIVE DIAGNOSIS:  left hip fracture  POST-OPERATIVE DIAGNOSIS:  SAME  PROCEDURE:  INTRAMEDULLARY (IM) NAIL FEMORAL  SURGEON:  Sheral Apley  ASSISTANT:  Levester Fresh, PA-C, he was present and scrubbed throughout the case, critical for completion in a timely fashion, and for retraction, instrumentation, and closure.   OPERATIVE IMPLANTS: Stryker Gamma Nail  PREOPERATIVE INDICATIONS:  [name] is a 87 y.o. year old who fell and suffered a hip fracture. He was brought into the ER and then admitted and optimized and then elected for surgical intervention.    The risks benefits and alternatives were discussed with the patient including but not limited to the risks of nonoperative treatment, versus surgical intervention including infection, bleeding, nerve injury, malunion, nonunion, hardware prominence, hardware failure, need for hardware removal, blood clots, cardiopulmonary complications, morbidity, mortality, among others, and they were willing to proceed.    OPERATIVE PROCEDURE:  The patient was brought to the operating room and placed in the supine position. General anesthesia was administered. He was placed on the fracture table.  Closed reduction was performed under C-arm guidance. Time out was then performed after sterile prep and drape. He received preoperative antibiotics.  Incision was made proximal to the greater trochanter. A guidewire was placed in the appropriate position. Confirmation was made on AP and lateral views. The above-named nail was opened. I opened the proximal femur with a reamer. I then placed the nail by hand easily down. I did not need to ream the femur.  Once the nail was completely seated, I placed a guidepin into the femoral head into the center center position. I measured the length, and then reamed the lateral cortex and up into the head. I then  placed the lag screw. Slight compression was applied. Anatomic fixation achieved. Bone quality was mediocre.  I then secured the proximal interlocking bolt, and took off a half a turn, and then removed the instruments, and took final C-arm pictures AP and lateral the entire length of the leg.    Anatomic reconstruction was achieved, and the wounds were irrigated copiously and closed with Vicryl followed by staples and sterile gauze for the skin. The patient was awakened and returned to PACU in stable and satisfactory condition. There no complications and the patient tolerated the procedure well.  He will be weightbearing as tolerated, and will be on chemical px  for a period of four weeks after discharge.   Margarita Rana, M.D.

## 2021-02-05 NOTE — Progress Notes (Signed)
PROGRESS NOTE    Russell Ayers  RCV:[id number] DOB: June 09, 1932 DOA: 02/04/2021 PCP: Soundra Pilon, FNP   Chief Complain: Hip pain  Brief Narrative: Patient is 87 year old male with history of hypertension, borderline diabetes who presented from independent living facility after he fell .  He fell to the sidewalk and reported pain on his left hip.  No history of head trauma.  On presentation he was found to have left hip fracture.  Orthopedics consulted and he underwent ORIF .  Pending PT/OT evaluation.   Assessment & Plan:   Active Problems:   Essential hypertension   Seizure (HCC)   S/p left hip fracture   Prediabetes   BPH (benign prostatic hyperplasia)   Fall/left hip fracture:presented from independent living facility after he fell .  He fell to the sidewalk and reported pain on his left hip.  No history of head trauma.  On presentation he was found to have left hip fracture.  Orthopedics consulted and he underwent ORIF /intramedullary nailing.  PT/OT evaluation pending.  DVT prophylaxis with lovenox.  Continue pain management.  Might  need SNF on discharge.  frequent PVCs/tachycardia: Observed on presentation.  EKG showed normal sinus rhythm.  Currently heart rate is stable.  EKG did not show any PVCs.  This is normal.  Hypertension: On Lotensin.  Currently above stable  History of prediabetes: Monitor blood sugars  History of BPH: Proscar, Flomax  History of seizure disorder following benzodiazepine withdrawal:  On lorazepam 2 mg 3 times daily.          DVT prophylaxis:Lovenox Code Status: DNR Family Communication: None at bedside Status is: Inpatient  Remains inpatient appropriate because:Inpatient level of care appropriate due to severity of illness  Dispo: The patient is from: ILF              Anticipated d/c is to: SNF              Patient currently is not medically stable to d/c.   Difficult to place patient No     Consultants:  Orthopedics  Procedures:ORIF  Antimicrobials:  Anti-infectives (From admission, onward)    Start     Dose/Rate Route Frequency Ordered Stop   02/05/21 1300  ceFAZolin (ANCEF) IVPB 2g/100 mL premix        2 g 200 mL/hr over 30 Minutes Intravenous Every 6 hours 02/05/21 1011 02/06/21 0059   02/05/21 0649  ceFAZolin (ANCEF) 2-4 GM/100ML-% IVPB       Note to Pharmacy: Crista Elliot   : cabinet override      02/05/21 0649 02/05/21 0802   02/05/21 0600  ceFAZolin (ANCEF) IVPB 2g/100 mL premix        2 g 200 mL/hr over 30 Minutes Intravenous On call to O.R. 02/05/21 0158 02/05/21 1025       Subjective:  Patient seen and examined the bedside after he came from surgery.  Hemodynamically stable and comfortable.  Alert and oriented.  On 2 L of oxygen, will try to wean.  Objective: Vitals:   02/05/21 0930 02/05/21 0940 02/05/21 0945 02/05/21 1005  BP: (!) 126/58  127/66 127/63  Pulse: 89 94 86 88  Resp: 15 17 14 15   Temp:   97.6 F (36.4 C) 97.8 F (36.6 C)  TempSrc:    Oral  SpO2: 97% 100% 100% 97%  Weight:      Height:        Intake/Output Summary (Last 24 hours) at 02/05/2021 1115 Last data filed  at 02/05/2021 1012 Gross per 24 hour  Intake 1070 ml  Output 500 ml  Net 570 ml   Filed Weights   02/04/21 1601 02/04/21 2240  Weight: 77.1 kg 74.8 kg    Examination:  General exam: Overall comfortable, not in distress, pleasant elderly male HEENT: PERRL Respiratory system:  no wheezes or crackles  Cardiovascular system: S1 & S2 heard, RRR.  Gastrointestinal system: Abdomen is nondistended, soft and nontender. Central nervous system: Alert and oriented Extremities: No edema, no clubbing ,no cyanosis, surgical wound on the left hip Skin: No rashes, no ulcers,no icterus      Data Reviewed: I have personally reviewed following labs and imaging studies  CBC: Recent Labs  Lab 02/04/21 2015 02/05/21 0122  WBC 9.9 9.8  NEUTROABS 8.3*  --   HGB 12.7* 11.4*  HCT 38.3*  33.7*  MCV 95.5 94.7  PLT 189 174   Basic Metabolic Panel: Recent Labs  Lab 02/04/21 2015 02/05/21 0122  NA 142 138  K 3.6 3.7  CL 107 102  CO2 27 26  GLUCOSE 134* 156*  BUN 13 13  CREATININE 0.88 0.94  CALCIUM 9.6 8.6*   GFR: Estimated Creatinine Clearance: 59.7 mL/min (by C-G formula based on SCr of 0.94 mg/dL). Liver Function Tests: No results for input(s): AST, ALT, ALKPHOS, BILITOT, PROT, ALBUMIN in the last 168 hours. No results for input(s): LIPASE, AMYLASE in the last 168 hours. No results for input(s): AMMONIA in the last 168 hours. Coagulation Profile: Recent Labs  Lab 02/04/21 2015  INR 1.0   Cardiac Enzymes: No results for input(s): CKTOTAL, CKMB, CKMBINDEX, TROPONINI in the last 168 hours. BNP (last 3 results) No results for input(s): PROBNP in the last 8760 hours. HbA1C: Recent Labs    02/04/21 2316  HGBA1C 5.8*   CBG: Recent Labs  Lab 02/05/21 0903  GLUCAP 120*   Lipid Profile: No results for input(s): CHOL, HDL, LDLCALC, TRIG, CHOLHDL, LDLDIRECT in the last 72 hours. Thyroid Function Tests: Recent Labs    02/04/21 2316  TSH 1.011   Anemia Panel: No results for input(s): VITAMINB12, FOLATE, FERRITIN, TIBC, IRON, RETICCTPCT in the last 72 hours. Sepsis Labs: No results for input(s): PROCALCITON, LATICACIDVEN in the last 168 hours.  Recent Results (from the past 240 hour(s))  Resp Panel by RT-PCR (Flu A&B, Covid) Nasopharyngeal Swab     Status: None   Collection Time: 02/04/21  8:24 PM   Specimen: Nasopharyngeal Swab; Nasopharyngeal(NP) swabs in vial transport medium  Result Value Ref Range Status   SARS Coronavirus 2 by RT PCR NEGATIVE NEGATIVE Final    Comment: (NOTE) SARS-CoV-2 target nucleic acids are NOT DETECTED.  The SARS-CoV-2 RNA is generally detectable in upper respiratory specimens during the acute phase of infection. The lowest concentration of SARS-CoV-2 viral copies this assay can detect is 138 copies/mL. A negative  result does not preclude SARS-Cov-2 infection and should not be used as the sole basis for treatment or other patient management decisions. A negative result may occur with  improper specimen collection/handling, submission of specimen other than nasopharyngeal swab, presence of viral mutation(s) within the areas targeted by this assay, and inadequate number of viral copies(<138 copies/mL). A negative result must be combined with clinical observations, patient history, and epidemiological information. The expected result is Negative.  Fact Sheet for Patients:  BloggerCourse.com  Fact Sheet for Healthcare Providers:  SeriousBroker.it  This test is no t yet approved or cleared by the [place] FDA and  has been  authorized for detection and/or diagnosis of SARS-CoV-2 by FDA under an Emergency Use Authorization (EUA). This EUA will remain  in effect (meaning this test can be used) for the duration of the COVID-19 declaration under Section 564(b)(1) of the Act, 21 U.S.C.section 360bbb-3(b)(1), unless the authorization is terminated  or revoked sooner.       Influenza A by PCR NEGATIVE NEGATIVE Final   Influenza B by PCR NEGATIVE NEGATIVE Final    Comment: (NOTE) The Xpert Xpress SARS-CoV-2/FLU/RSV plus assay is intended as an aid in the diagnosis of influenza from Nasopharyngeal swab specimens and should not be used as a sole basis for treatment. Nasal washings and aspirates are unacceptable for Xpert Xpress SARS-CoV-2/FLU/RSV testing.  Fact Sheet for Patients: BloggerCourse.com  Fact Sheet for Healthcare Providers: SeriousBroker.it  This test is not yet approved or cleared by the [place] FDA and has been authorized for detection and/or diagnosis of SARS-CoV-2 by FDA under an Emergency Use Authorization (EUA). This EUA will remain in effect (meaning this test can be used)  for the duration of the COVID-19 declaration under Section 564(b)(1) of the Act, 21 U.S.C. section 360bbb-3(b)(1), unless the authorization is terminated or revoked.  Performed at [facility], [address], [place], [place] [address]   Surgical PCR screen     Status: None   Collection Time: 02/05/21 12:53 AM   Specimen: Nasal Mucosa; Nasal Swab  Result Value Ref Range Status   MRSA, PCR NEGATIVE NEGATIVE Final   Staphylococcus aureus NEGATIVE NEGATIVE Final    Comment: (NOTE) The Xpert SA Assay (FDA approved for NASAL specimens in patients 74 years of age and older), is one component of a comprehensive surveillance program. It is not intended to diagnose infection nor to guide or monitor treatment. Performed at [facility], [address], [place], [place] [address]          Radiology Studies: DG C-Arm 1-60 Min-No Report  Result Date: 02/05/2021 Fluoroscopy was utilized by the requesting physician.  No radiographic interpretation.   DG Hip Unilat W or Wo Pelvis 2-3 Views Left  Result Date: 02/04/2021 CLINICAL DATA:  Hip pain after fall EXAM: DG HIP (WITH OR WITHOUT PELVIS) 2-3V LEFT COMPARISON:  None. FINDINGS: Pubic symphysis appears intact. The SI joints are non widened. Acute displaced basicervical fracture without femoral head dislocation. IMPRESSION: Acute displaced basicervical fracture without femoral head displacement. Electronically Signed   By: Jasmine Pang M.D.   On: 02/04/2021 19:05   DG FEMUR MIN 2 VIEWS LEFT  Result Date: 02/05/2021 CLINICAL DATA:  ORIF LEFT femur fracture. EXAM: LEFT FEMUR 2 VIEWS COMPARISON:  02/04/2021 FINDINGS: Three intraoperative spot views of the LEFT femur are submitted postoperatively for interpretation. The visualized IM nail/screw is noted traversing a proximal LEFT femur fracture. No gross complicating features are noted on these views. IMPRESSION: ORIF LEFT femur fracture. Electronically  Signed   By: Harmon Pier M.D.   On: 02/05/2021 10:49        Scheduled Meds:  acetaminophen  500 mg Oral Q6H   benazepril  10 mg Oral Daily   docusate sodium  100 mg Oral BID   enoxaparin (LOVENOX) injection  40 mg Subcutaneous Q24H   fentaNYL       finasteride  5 mg Oral Daily   latanoprost  1 drop Both Eyes QHS   mupirocin ointment  1 application Nasal BID   pantoprazole  40 mg Oral Daily   phenylephrine  psyllium  1 packet Oral QPM   tamsulosin  0.4 mg Oral QPM   temazepam  30 mg Oral QHS   timolol  1 drop Both Eyes QPM   vitamin B-12  100 mcg Oral Daily   Continuous Infusions:   ceFAZolin (ANCEF) IV     methocarbamol (ROBAXIN) IV     tranexamic acid       LOS: 1 day    Time spent: 25 mins.More than 50% of that time was spent in counseling and/or coordination of care.      Burnadette Pop, MD [facility] P9/17/2022, 11:15 AM

## 2021-02-05 NOTE — Anesthesia Postprocedure Evaluation (Signed)
Anesthesia Post Note  Patient: Russell Ayers  Procedure(s) Performed: INTRAMEDULLARY (IM) NAIL FEMORAL (Left: Hip)     Patient location during evaluation: PACU Anesthesia Type: General Level of consciousness: awake Pain management: pain level controlled Vital Signs Assessment: post-procedure vital signs reviewed and stable Respiratory status: spontaneous breathing, nonlabored ventilation, respiratory function stable and patient connected to nasal cannula oxygen Cardiovascular status: blood pressure returned to baseline and stable Postop Assessment: no apparent nausea or vomiting Anesthetic complications: no   No notable events documented.  Last Vitals:  Vitals:   02/05/21 1005 02/05/21 1200  BP: 127/63 126/62  Pulse: 88 83  Resp: 15   Temp: 36.6 C 36.8 C  SpO2: 97% 95%    Last Pain:  Vitals:   02/05/21 1410  TempSrc:   PainSc: 7                  Aubrianne Molyneux P Gene Glazebrook

## 2021-02-05 NOTE — Plan of Care (Signed)
  Problem: Clinical Measurements: Goal: Respiratory complications will improve Outcome: Progressing   Problem: Clinical Measurements: Goal: Cardiovascular complication will be avoided Outcome: Progressing   Problem: Pain Managment: Goal: General experience of comfort will improve Outcome: Progressing   Problem: Skin Integrity: Goal: Risk for impaired skin integrity will decrease Outcome: Progressing   

## 2021-02-05 NOTE — Consult Note (Signed)
ORTHOPAEDIC CONSULTATION  REQUESTING PHYSICIAN: Shelly Coss, MD  Time called na Time arrived na  Chief Complaint: left hip fracture  HPI: Russell Ayers is a 87 y.o. male I have reviewed and agree with below history  Russell Ayers is a 87 y.o. male with medical history significant of HTN, borderline DM, previous seizure following benzodiazepine withdrawal who ambulates with a cane who went out a door this morning and fell.  He does not recall any presyncopal symptoms or tripping.  He fell to the sidewalk and hurt his hip.  He does not have any loss of consciousness.   Past Medical History:  Diagnosis Date   Diabetes mellitus without complication (HCC)    Generalized anxiety disorder    Glaucoma    Hypertension    Inguinal hernia    Insomnia    Prostatism    Stroke [facility] [place])    Past Surgical History:  Procedure Laterality Date   CATARACT EXTRACTION, BILATERAL     glaucoma     Social History   Socioeconomic History   Marital status: Divorced    Spouse name: Not on file   Number of children: 1   Years of education: Not on file   Highest education level: Not on file  Occupational History   Occupation: retired Engineer, technical sales  Tobacco Use   Smoking status: Former    Types: Cigarettes    Quit date: 02/18/1989    Years since quitting: 31.9   Smokeless tobacco: Never  Substance and Sexual Activity   Alcohol use: Never   Drug use: Never   Sexual activity: Not on file  Other Topics Concern   Not on file  Social History Narrative   Not on file   Social Determinants of Health   Financial Resource Strain: Not on file  Food Insecurity: Not on file  Transportation Needs: Not on file  Physical Activity: Not on file  Stress: Not on file  Social Connections: Not on file   Family History  Problem Relation Age of Onset   Cancer Brother    Heart attack Brother    No Known Allergies Prior to Admission medications   Medication Sig Start Date End Date Taking?  Authorizing Provider  benazepril (LOTENSIN) 10 MG tablet Take 10 mg by mouth daily. 10/20/19  Yes [provider]  diphenhydrAMINE (BENADRYL) 25 MG tablet Take 50 mg by mouth in the morning and at bedtime.   Yes [provider]  finasteride (PROSCAR) 5 MG tablet Take 5 mg by mouth daily. 09/30/18  Yes [provider]  fluticasone (FLONASE) 50 MCG/ACT nasal spray Place 1-2 sprays into both nostrils daily as needed for allergies.   Yes [provider]  latanoprost (XALATAN) 0.005 % ophthalmic solution Place 1 drop into both eyes at bedtime. 11/20/18  Yes [provider]  LORazepam (ATIVAN) 2 MG tablet Take 2 mg by mouth at bedtime as needed for sleep. 02/04/21  Yes [provider]  Melatonin 10 MG TABS Take 40-60 mg by mouth at bedtime as needed (sleep).   Yes [provider]  Nutritional Supplements (PROSTATE PO) Take 1 capsule by mouth every evening.   Yes [provider]  Omega-3 Fatty Acids (FISH OIL PO) Take 1 capsule by mouth once a week.   Yes [provider]  PSYLLIUM HUSK PO Take 1 tablet by mouth every evening.   Yes [provider]  tamsulosin (FLOMAX) 0.4 MG CAPS capsule Take 0.4 mg by mouth  every evening. 11/25/20  Yes [provider]  temazepam (RESTORIL) 30 MG capsule Take 30 mg by mouth at bedtime.   Yes [provider]  timolol (BETIMOL) 0.5 % ophthalmic solution Place 1 drop into both eyes every evening.   Yes [provider]  vitamin B-12 (CYANOCOBALAMIN) 100 MCG tablet Take 100 mcg by mouth daily.   Yes [provider]  temazepam (RESTORIL) 30 MG capsule Take 1 capsule (30 mg total) by mouth at bedtime for 3 days. 08/03/20 08/06/20  Elodia Florence., MD   DG Hip Kaylyn Layer W or Wo Pelvis 2-3 Views Left  Result Date: 02/04/2021 CLINICAL DATA:  Hip pain after fall EXAM: DG HIP (WITH OR WITHOUT PELVIS) 2-3V LEFT COMPARISON:  None. FINDINGS: Pubic symphysis appears  intact. The SI joints are non widened. Acute displaced basicervical fracture without femoral head dislocation. IMPRESSION: Acute displaced basicervical fracture without femoral head displacement. Electronically Signed   By: Donavan Foil M.D.   On: 02/04/2021 19:05    Positive ROS: All other systems have been reviewed and were otherwise negative with the exception of those mentioned in the HPI and as above.  Labs cbc Recent Labs    02/04/21 2015 02/05/21 0122  WBC 9.9 9.8  HGB 12.7* 11.4*  HCT 38.3* 33.7*  PLT 189 174    Labs inflam No results for input(s): CRP in the last 72 hours.  Invalid input(s): ESR  Labs coag Recent Labs    02/04/21 2015  INR 1.0    Recent Labs    02/04/21 2015 02/05/21 0122  NA 142 138  K 3.6 3.7  CL 107 102  CO2 27 26  GLUCOSE 134* 156*  BUN 13 13  CREATININE 0.88 0.94  CALCIUM 9.6 8.6*    Physical Exam: Vitals:   02/05/21 0252 02/05/21 0611  BP: 110/68 131/67  Pulse: 86 84  Resp: 16 16  Temp: 97.7 F (36.5 C) 97.7 F (36.5 C)  SpO2: 99% 100%   General: Alert, no acute distress Cardiovascular: No pedal edema Respiratory: No cyanosis, no use of accessory musculature GI: No organomegaly, abdomen is soft and non-tender Skin: No lesions in the area of chief complaint other than those listed below in MSK exam.  Neurologic: Sensation intact distally save for the below mentioned MSK exam Psychiatric: Patient is competent for consent with normal mood and affect Lymphatic: No axillary or cervical lymphadenopathy  MUSCULOSKELETAL:  Pain at the L hip, NVI, compartments soft Other extremities are atraumatic with painless ROM and NVI.  Assessment: Left basicervical hip fx.  Plan: IM nail today I have discussed the risks and benefits with him and he wishes to proceed.    Renette Butters, MD    02/05/2021 7:35 AM

## 2021-02-06 DIAGNOSIS — Z8781 Personal history of (healed) traumatic fracture: Secondary | ICD-10-CM | POA: Diagnosis not present

## 2021-02-06 LAB — BASIC METABOLIC PANEL
Anion gap: 5 (ref 5–15)
BUN: 14 mg/dL (ref 8–23)
CO2: 27 mmol/L (ref 22–32)
Calcium: 8.7 mg/dL — ABNORMAL LOW (ref 8.9–10.3)
Chloride: 106 mmol/L (ref 98–111)
Creatinine, Ser: 0.67 mg/dL (ref 0.61–1.24)
GFR, Estimated: >60 mL/min (ref >60)
Glucose, Bld: 160 mg/dL — ABNORMAL HIGH (ref 70–99)
Potassium: 4.2 mmol/L (ref 3.5–5.1)
Sodium: 138 mmol/L (ref 135–145)

## 2021-02-06 LAB — HEMOGLOBIN A1C
Hgb A1c MFr Bld: 5.8 % — ABNORMAL HIGH (ref 4.8–5.6)
Mean Plasma Glucose: 119.76 mg/dL

## 2021-02-06 LAB — GLUCOSE, CAPILLARY
Glucose-Capillary: 115 mg/dL — ABNORMAL HIGH (ref 70–99)
Glucose-Capillary: 186 mg/dL — ABNORMAL HIGH (ref 70–99)
Glucose-Capillary: 210 mg/dL — ABNORMAL HIGH (ref 70–99)
Glucose-Capillary: 145 mg/dL — ABNORMAL HIGH (ref 70–99)

## 2021-02-06 LAB — CBC
HCT: 27.9 % — ABNORMAL LOW (ref 39.0–52.0)
Hemoglobin: 9.4 g/dL — ABNORMAL LOW (ref 13.0–17.0)
MCH: 31.8 pg (ref 26.0–34.0)
MCHC: 33.7 g/dL (ref 30.0–36.0)
MCV: 94.3 fL (ref 80.0–100.0)
Platelets: 126 10*3/uL — ABNORMAL LOW (ref 150–400)
RBC: 2.96 MIL/uL — ABNORMAL LOW (ref 4.22–5.81)
RDW: 13.2 % (ref 11.5–15.5)
WBC: 7.7 10*3/uL (ref 4.0–10.5)
nRBC: 0 % (ref 0.0–0.2)

## 2021-02-06 NOTE — Progress Notes (Signed)
PROGRESS NOTE    Russell Ayers  MEQ:[id number] DOB: 25-Dec-1932 DOA: 02/04/2021 PCP: Soundra Pilon, FNP   Chief Complain: Hip pain  Brief Narrative: Patient is 87 year old male with history of hypertension, borderline diabetes who presented from independent living facility after he fell .  He fell to the sidewalk and reported pain on his left hip.  No history of head trauma.  On presentation he was found to have left hip fracture.  Orthopedics consulted and he underwent ORIF .  Pending PT/OT evaluation.   Assessment & Plan:   Active Problems:   Essential hypertension   Seizure (HCC)   S/p left hip fracture   Prediabetes   BPH (benign prostatic hyperplasia)   Fall/left hip fracture:presented from independent living facility after he fell .  He fell to the sidewalk and reported pain on his left hip.  No history of head trauma.  On presentation he was found to have left hip fracture.  Orthopedics consulted and he underwent ORIF /intramedullary nailing.  PT/OT evaluation pending.  DVT prophylaxis with lovenox.  Continue pain management.  Might  need SNF on discharge.  frequent PVCs/tachycardia: Observed on presentation.  EKG showed normal sinus rhythm.  Currently heart rate is stable.  EKG did not show any PVCs.  This is normal.  Acute blood loss anemia: Hemoglobin dropped to the range of 9 today.  Most likely associated with hip surgery.  Continue to monitor  Hypertension: On Lotensin.  Currently above stable  History of prediabetes: Monitor blood sugars  History of BPH: Proscar, Flomax  History of seizure disorder following benzodiazepine withdrawal: On Ativan and temazepam for nighttime.I confirmed with daughter that he takes these on continuous basis.She said that we shd continue both because he had seizures in the past when discontinued         DVT prophylaxis:Lovenox Code Status: DNR Family Communication: Daughter on phone on 02/06/2021 Status is: Inpatient  Remains  inpatient appropriate because:Inpatient level of care appropriate due to severity of illness  Dispo: The patient is from: ILF              Anticipated d/c is to: SNF              Patient currently is not medically stable to d/c.   Difficult to place patient No     Consultants: Orthopedics  Procedures:ORIF  Antimicrobials:  Anti-infectives (From admission, onward)    Start     Dose/Rate Route Frequency Ordered Stop   02/05/21 1300  ceFAZolin (ANCEF) IVPB 2g/100 mL premix        2 g 200 mL/hr over 30 Minutes Intravenous Every 6 hours 02/05/21 1011 02/05/21 1827   02/05/21 0649  ceFAZolin (ANCEF) 2-4 GM/100ML-% IVPB       Note to Pharmacy: Crista Elliot   : cabinet override      02/05/21 0649 02/05/21 0802   02/05/21 0600  ceFAZolin (ANCEF) IVPB 2g/100 mL premix        2 g 200 mL/hr over 30 Minutes Intravenous On call to O.R. 02/05/21 0158 02/05/21 2979       Subjective:  Patient seen and examined the bedside this morning.  Hemodynamically stable.  He was about to work with a physical therapist.  Denies any complaints.  He said he did not have a good sleep last night..  Objective: Vitals:   02/05/21 1648 02/05/21 2304 02/06/21 0117 02/06/21 0705  BP: 113/73 (!) 105/54 (!) 122/48 132/87  Pulse: 100 78 63 81  Resp: 18 16 16 16   Temp: 98.3 F (36.8 C) 98.2 F (36.8 C) 97.6 F (36.4 C) (!) 97.4 F (36.3 C)  TempSrc:  Oral Oral Oral  SpO2: 100% 100% 100% 100%  Weight:      Height:        Intake/Output Summary (Last 24 hours) at 02/06/2021 0824 Last data filed at 02/06/2021 0756 Gross per 24 hour  Intake 2730 ml  Output 1500 ml  Net 1230 ml   Filed Weights   02/04/21 1601 02/04/21 2240 02/05/21 1638  Weight: 77.1 kg 74.8 kg 65.5 kg    Examination:  General exam: Overall comfortable, not in distress, debilitated/deconditioned elderly male HEENT: PERRL Respiratory system:  no wheezes or crackles  Cardiovascular system: S1 & S2 heard, RRR.  Gastrointestinal  system: Abdomen is nondistended, soft and nontender. Central nervous system: Alert and oriented Extremities: No edema, no clubbing ,no cyanosis, surgical wound on the left hip Skin: No rashes, no ulcers,no icterus      Data Reviewed: I have personally reviewed following labs and imaging studies  CBC: Recent Labs  Lab 02/04/21 2015 02/05/21 0122 02/06/21 0306  WBC 9.9 9.8 7.7  NEUTROABS 8.3*  --   --   HGB 12.7* 11.4* 9.4*  HCT 38.3* 33.7* 27.9*  MCV 95.5 94.7 94.3  PLT 189 174 126*   Basic Metabolic Panel: Recent Labs  Lab 02/04/21 2015 02/05/21 0122 02/06/21 0306  NA 142 138 138  K 3.6 3.7 4.2  CL 107 102 106  CO2 27 26 27   GLUCOSE 134* 156* 160*  BUN 13 13 14   CREATININE 0.88 0.94 0.67  CALCIUM 9.6 8.6* 8.7*   GFR: Estimated Creatinine Clearance: 61.4 mL/min (by C-G formula based on SCr of 0.67 mg/dL). Liver Function Tests: No results for input(s): AST, ALT, ALKPHOS, BILITOT, PROT, ALBUMIN in the last 168 hours. No results for input(s): LIPASE, AMYLASE in the last 168 hours. No results for input(s): AMMONIA in the last 168 hours. Coagulation Profile: Recent Labs  Lab 02/04/21 2015  INR 1.0   Cardiac Enzymes: No results for input(s): CKTOTAL, CKMB, CKMBINDEX, TROPONINI in the last 168 hours. BNP (last 3 results) No results for input(s): PROBNP in the last 8760 hours. HbA1C: Recent Labs    02/04/21 2316  HGBA1C 5.8*   CBG: Recent Labs  Lab 02/05/21 0903 02/05/21 1633 02/05/21 2319 02/06/21 0755  GLUCAP 120* 324* 209* 115*   Lipid Profile: No results for input(s): CHOL, HDL, LDLCALC, TRIG, CHOLHDL, LDLDIRECT in the last 72 hours. Thyroid Function Tests: Recent Labs    02/04/21 2316  TSH 1.011   Anemia Panel: No results for input(s): VITAMINB12, FOLATE, FERRITIN, TIBC, IRON, RETICCTPCT in the last 72 hours. Sepsis Labs: No results for input(s): PROCALCITON, LATICACIDVEN in the last 168 hours.  Recent Results (from the past 240 hour(s))   Resp Panel by RT-PCR (Flu A&B, Covid) Nasopharyngeal Swab     Status: None   Collection Time: 02/04/21  8:24 PM   Specimen: Nasopharyngeal Swab; Nasopharyngeal(NP) swabs in vial transport medium  Result Value Ref Range Status   SARS Coronavirus 2 by RT PCR NEGATIVE NEGATIVE Final    Comment: (NOTE) SARS-CoV-2 target nucleic acids are NOT DETECTED.  The SARS-CoV-2 RNA is generally detectable in upper respiratory specimens during the acute phase of infection. The lowest concentration of SARS-CoV-2 viral copies this assay can detect is 138 copies/mL. A negative result does not preclude SARS-Cov-2 infection and should not be used as the sole basis  for treatment or other patient management decisions. A negative result may occur with  improper specimen collection/handling, submission of specimen other than nasopharyngeal swab, presence of viral mutation(s) within the areas targeted by this assay, and inadequate number of viral copies(<138 copies/mL). A negative result must be combined with clinical observations, patient history, and epidemiological information. The expected result is Negative.  Fact Sheet for Patients:  BloggerCourse.com  Fact Sheet for Healthcare Providers:  SeriousBroker.it  This test is no t yet approved or cleared by the [place] FDA and  has been authorized for detection and/or diagnosis of SARS-CoV-2 by FDA under an Emergency Use Authorization (EUA). This EUA will remain  in effect (meaning this test can be used) for the duration of the COVID-19 declaration under Section 564(b)(1) of the Act, 21 U.S.C.section 360bbb-3(b)(1), unless the authorization is terminated  or revoked sooner.       Influenza A by PCR NEGATIVE NEGATIVE Final   Influenza B by PCR NEGATIVE NEGATIVE Final    Comment: (NOTE) The Xpert Xpress SARS-CoV-2/FLU/RSV plus assay is intended as an aid in the diagnosis of influenza from  Nasopharyngeal swab specimens and should not be used as a sole basis for treatment. Nasal washings and aspirates are unacceptable for Xpert Xpress SARS-CoV-2/FLU/RSV testing.  Fact Sheet for Patients: BloggerCourse.com  Fact Sheet for Healthcare Providers: SeriousBroker.it  This test is not yet approved or cleared by the [place] FDA and has been authorized for detection and/or diagnosis of SARS-CoV-2 by FDA under an Emergency Use Authorization (EUA). This EUA will remain in effect (meaning this test can be used) for the duration of the COVID-19 declaration under Section 564(b)(1) of the Act, 21 U.S.C. section 360bbb-3(b)(1), unless the authorization is terminated or revoked.  Performed at [facility], [address], [place], [place] [address]   Surgical PCR screen     Status: None   Collection Time: 02/05/21 12:53 AM   Specimen: Nasal Mucosa; Nasal Swab  Result Value Ref Range Status   MRSA, PCR NEGATIVE NEGATIVE Final   Staphylococcus aureus NEGATIVE NEGATIVE Final    Comment: (NOTE) The Xpert SA Assay (FDA approved for NASAL specimens in patients 17 years of age and older), is one component of a comprehensive surveillance program. It is not intended to diagnose infection nor to guide or monitor treatment. Performed at [facility], [address] [place], [place] [address]          Radiology Studies: DG C-Arm 1-60 Min-No Report  Result Date: 02/05/2021 Fluoroscopy was utilized by the requesting physician.  No radiographic interpretation.   DG Hip Unilat W or Wo Pelvis 2-3 Views Left  Result Date: 02/04/2021 CLINICAL DATA:  Hip pain after fall EXAM: DG HIP (WITH OR WITHOUT PELVIS) 2-3V LEFT COMPARISON:  None. FINDINGS: Pubic symphysis appears intact. The SI joints are non widened. Acute displaced basicervical fracture without femoral head dislocation. IMPRESSION: Acute  displaced basicervical fracture without femoral head displacement. Electronically Signed   By: Jasmine Pang M.D.   On: 02/04/2021 19:05   DG FEMUR MIN 2 VIEWS LEFT  Result Date: 02/05/2021 CLINICAL DATA:  ORIF LEFT femur fracture. EXAM: LEFT FEMUR 2 VIEWS COMPARISON:  02/04/2021 FINDINGS: Three intraoperative spot views of the LEFT femur are submitted postoperatively for interpretation. The visualized IM nail/screw is noted traversing a proximal LEFT femur fracture. No gross complicating features are noted on these views. IMPRESSION: ORIF LEFT femur fracture. Electronically Signed   By: Henrietta Hoover.D.  On: 02/05/2021 10:49        Scheduled Meds:  acetaminophen  500 mg Oral Q6H   benazepril  10 mg Oral Daily   docusate sodium  100 mg Oral BID   enoxaparin (LOVENOX) injection  40 mg Subcutaneous Q24H   feeding supplement  237 mL Oral BID BM   finasteride  5 mg Oral Daily   insulin aspart  0-15 Units Subcutaneous TID WC   insulin aspart  0-5 Units Subcutaneous QHS   latanoprost  1 drop Both Eyes QHS   mupirocin ointment  1 application Nasal BID   pantoprazole  40 mg Oral Daily   psyllium  1 packet Oral QPM   tamsulosin  0.4 mg Oral QPM   temazepam  30 mg Oral QHS   timolol  1 drop Both Eyes QPM   vitamin B-12  100 mcg Oral Daily   Continuous Infusions:  methocarbamol (ROBAXIN) IV       LOS: 2 days    Time spent: 25 mins.More than 50% of that time was spent in counseling and/or coordination of care.      Burnadette Pop, MD [facility] P9/18/2022, 8:24 AM

## 2021-02-06 NOTE — Evaluation (Signed)
Occupational Therapy Evaluation Patient Details Name: Russell Ayers MRN: [id number] DOB: 1932/08/21 Today's Date: 02/06/2021   History of Present Illness Pt s/p fall with L hip fx and now s/p IM nailing.  Pt with hx of DM, anxiety and CVA (with residual dysphagia).   Clinical Impression   Patient is a 87 year old male who was admitted for above. Patient is currently +2 for transfers, with max A for LB bathing/dressing tasks. Patient was noted to have increased pain, decreased standing balance, decreased endurance, decreased functional activity tolerance impacting patients participation in ADLs. Patient would continue to benefit from skilled OT services at this time while admitted and after d/c to address noted deficits in order to improve overall safety and independence in ADLs.        Recommendations for follow up therapy are one component of a multi-disciplinary discharge planning process, led by the attending physician.  Recommendations may be updated based on patient status, additional functional criteria and insurance authorization.   Follow Up Recommendations  SNF    Equipment Recommendations  None recommended by OT    Recommendations for Other Services       Precautions / Restrictions Precautions Precautions: Fall Precaution Comments: h/o seizures Restrictions Weight Bearing Restrictions: No LLE Weight Bearing: Weight bearing as tolerated      Mobility Bed Mobility Overal bed mobility: Needs Assistance Bed Mobility: Supine to Sit     Supine to sit: Min assist;Mod assist;+2 for physical assistance;+2 for safety/equipment     General bed mobility comments: patient required physical assist to mange LLE and trunk control to transition into sitting. patient needed cues and continued education to use RLE and for sequencing.    Transfers Overall transfer level: Needs assistance Equipment used: Rolling walker (2 wheeled) Transfers: Sit to/from Stand Sit to Stand: Min  assist;Mod assist;+2 physical assistance;+2 safety/equipment;From elevated surface         General transfer comment: patient needed physical assist to bring weight fowards into standing with noted leaning to R side to offload LLE.    Balance Overall balance assessment: Needs assistance Sitting-balance support: No upper extremity supported;Single extremity supported Sitting balance-Leahy Scale: Fair     Standing balance support: Bilateral upper extremity supported Standing balance-Leahy Scale: Poor                             ADL either performed or assessed with clinical judgement   ADL Overall ADL's : Needs assistance/impaired Eating/Feeding: Set up;Sitting   Grooming: Wash/dry face;Wash/dry hands;Sitting Grooming Details (indicate cue type and reason): on edge of bed Upper Body Bathing: Sitting;Min guard   Lower Body Bathing: Sitting/lateral leans;Moderate assistance   Upper Body Dressing : Set up   Lower Body Dressing: Sitting/lateral leans;Moderate assistance   Toilet Transfer: +2 for physical assistance;+2 for safety/equipment;Moderate assistance;RW Toilet Transfer Details (indicate cue type and reason): patient participated in transfer from edge of bed to recliner in room. patient was noted to lean to R side with weight to transition into standing. once in standing patient was able to even out weight through BUE and BLE. patient needed min A to weight shift to advance RLE. Toileting- Clothing Manipulation and Hygiene: Maximal assistance;Sitting/lateral lean       Functional mobility during ADLs: Moderate assistance;Cueing for safety;Cueing for sequencing;Rolling walker;+2 for safety/equipment;+2 for physical assistance       Vision Baseline Vision/History: 1 Wears glasses Ability to See in Adequate Light: 1 Impaired Patient Visual  Report: No change from baseline Additional Comments: patient reported liking other pair of glasses at home better.      Perception     Praxis      Pertinent Vitals/Pain Pain Assessment: 0-10 Pain Score: 5  Pain Location: L hip Pain Descriptors / Indicators: Aching;Sore Pain Intervention(s): Limited activity within patient's tolerance;Monitored during session;Premedicated before session;Ice applied     Hand Dominance Right   Extremity/Trunk Assessment Upper Extremity Assessment Upper Extremity Assessment: Defer to OT evaluation   Lower Extremity Assessment Lower Extremity Assessment: LLE deficits/detail LLE: Unable to fully assess due to pain   Cervical / Trunk Assessment Cervical / Trunk Assessment: Kyphotic   Communication Communication Communication: HOH   Cognition Arousal/Alertness: Awake/alert Behavior During Therapy: WFL for tasks assessed/performed Overall Cognitive Status: Within Functional Limits for tasks assessed                                     General Comments       Exercises Exercises: General Lower Extremity General Exercises - Lower Extremity Ankle Circles/Pumps: AROM;Both;15 reps;Supine   Shoulder Instructions      Home Living Family/patient expects to be discharged to:: Private residence Living Arrangements: Alone Available Help at Discharge: Family;Available PRN/intermittently Type of Home: House Home Access: Level entry     Home Layout: One level     Bathroom Shower/Tub: Tub/shower unit         Home Equipment: Cane - single point;Wheelchair - manual   Additional Comments: [place]. daughter comes to visit frequently.      Prior Functioning/Environment Level of Independence: Independent with assistive device(s)        Comments: patient uses cane for mobility. patient reported doing his own medication management, grocery shopping and driving prior to fall.        OT Problem List: Decreased strength;Decreased activity tolerance;Impaired balance (sitting and/or standing);Decreased  safety awareness;Decreased knowledge of use of DME or AE;Pain      OT Treatment/Interventions: Self-care/ADL training;Therapeutic exercise;DME and/or AE instruction;Therapeutic activities;Balance training;Patient/family education    OT Goals(Current goals can be found in the care plan section) Acute Rehab OT Goals Patient Stated Goal: Regain IND OT Goal Formulation: With patient Time For Goal Achievement: 02/20/21 Potential to Achieve Goals: Good  OT Frequency: Min 2X/week   Barriers to D/C:    [place]       Co-evaluation   Reason for Co-Treatment: For patient/therapist safety PT goals addressed during session: Mobility/safety with mobility OT goals addressed during session: ADL's and self-care      AM-PAC OT "6 Clicks" Daily Activity     Outcome Measure Help from another person eating meals?: None Help from another person taking care of personal grooming?: None Help from another person toileting, which includes using toliet, bedpan, or urinal?: Total Help from another person bathing (including washing, rinsing, drying)?: A Lot Help from another person to put on and taking off regular upper body clothing?: A Little Help from another person to put on and taking off regular lower body clothing?: A Lot 6 Click Score: 16   End of Session Equipment Utilized During Treatment: Gait belt;Rolling walker Nurse Communication: Other (comment) (nurse cleared patient to participate)  Activity Tolerance: Patient tolerated treatment well Patient left: in chair;with call bell/phone within reach;with chair alarm set  OT Visit Diagnosis: Unsteadiness on feet (R26.81);Muscle  weakness (generalized) (M62.81);Pain Pain - Right/Left: Left Pain - part of body: Leg                Time: 8251-8984 OT Time Calculation (min): 38 min Charges:  OT General Charges $OT Visit: 1 Visit OT Evaluation $OT Eval Moderate Complexity: 1 Mod  Sharyn Blitz OTR/L, MS Acute  Rehabilitation Department Office# [phone] Pager# [phone]   Chalmers Guest Kol Consuegra 02/06/2021, 1:23 PM

## 2021-02-06 NOTE — Plan of Care (Signed)
  Problem: Coping: Goal: Level of anxiety will decrease Outcome: Progressing   Problem: Pain Managment: Goal: General experience of comfort will improve Outcome: Progressing   

## 2021-02-06 NOTE — Plan of Care (Signed)
Plan of care reviewed and discussed with the patient. 

## 2021-02-06 NOTE — Plan of Care (Signed)
  Problem: Clinical Measurements: Goal: Respiratory complications will improve Outcome: Progressing   Problem: Clinical Measurements: Goal: Cardiovascular complication will be avoided Outcome: Progressing   Problem: Elimination: Goal: Will not experience complications related to bowel motility Outcome: Progressing   Problem: Coping: Goal: Level of anxiety will decrease Outcome: Progressing   Problem: Skin Integrity: Goal: Risk for impaired skin integrity will decrease Outcome: Progressing   Problem: Education: Goal: Knowledge of the prescribed therapeutic regimen will improve Outcome: Progressing

## 2021-02-06 NOTE — Plan of Care (Signed)
  Problem: Coping: Goal: Level of anxiety will decrease 02/06/2021 0727 by Kizzie Bane, RN Outcome: Progressing 02/06/2021 0727 by Kizzie Bane, RN Outcome: Progressing   Problem: Pain Managment: Goal: General experience of comfort will improve 02/06/2021 0727 by Kizzie Bane, RN Outcome: Progressing 02/06/2021 0727 by Kizzie Bane, RN Outcome: Progressing

## 2021-02-06 NOTE — Progress Notes (Signed)
    Subjective: Patient reports pain as mild to moderate. Tolerating diet but not very hungry. Urinating via condom cath. Amber colored urine in canister. Not sleeping well. No CP, SOB. Has worked with PT some on mobilizing OOB.   Objective:   VITALS:   Vitals:   02/05/21 2304 02/06/21 0117 02/06/21 0705 02/06/21 1040  BP: (!) 105/54 (!) 122/48 132/87 101/60  Pulse: 78 63 81 100  Resp: 16 16 16 18   Temp: 98.2 F (36.8 C) 97.6 F (36.4 C) (!) 97.4 F (36.3 C) 97.7 F (36.5 C)  TempSrc: Oral Oral Oral Oral  SpO2: 100% 100% 100% 93%  Weight:      Height:       CBC Latest Ref Rng & Units 02/06/2021 02/05/2021 02/04/2021  WBC 4.0 - 10.5 K/uL 7.7 9.8 9.9  Hemoglobin 13.0 - 17.0 g/dL 02/06/2021) 11.4(L) 12.7(L)  Hematocrit 39.0 - 52.0 % 27.9(L) 33.7(L) 38.3(L)  Platelets 150 - 400 K/uL 126(L) 174 189   BMP Latest Ref Rng & Units 02/06/2021 02/05/2021 02/04/2021  Glucose 70 - 99 mg/dL 02/06/2021) 322(G) 254(Y)  BUN 8 - 23 mg/dL 14 13 13   Creatinine 0.61 - 1.24 mg/dL 706(C 3.76  BUN/Creat Ratio 10 - 24 - - -  Sodium 135 - 145 mmol/L 138 138 142  Potassium 3.5 - 5.1 mmol/L 4.2 3.7 3.6  Chloride 98 - 111 mmol/L 106 102 107  CO2 22 - 32 mmol/L 27 26 27   Calcium 8.9 - 10.3 mg/dL 2.83) 1.51) 9.6   Intake/Output      09/17 0701 09/18 0700 09/18 0701 09/19 0700   P.O. 1790 840   I.V. (mL/kg) 500 (7.6)    IV Piggyback 500    Total Intake(mL/kg) 2790 (42.6) 840 (12.8)   Urine (mL/kg/hr) 1200 (0.8) 200 (0.5)   Stool  0   Blood 100    Total Output 1300 200   Net +1490 +640        Urine Occurrence 2 x 2 x   Stool Occurrence  1 x      Physical Exam: General: NAD. Sitting in bedside chair. Calm, quiet Resp: No increased wob Cardio: regular rate and rhythm ABD soft Neurologically intact MSK Neurovascularly intact Sensation intact distally Intact pulses distally Dorsiflexion/Plantar flexion intact Incision: dressing C/D/I   Assessment: 1 Day Post-Op  S/P Procedure(s)  (LRB): INTRAMEDULLARY (IM) NAIL FEMORAL (Left) by Dr. 10/18. 10/18 on 02/05/21  Active Problems:   Essential hypertension   Seizure (HCC)   S/p left hip fracture   Prediabetes   BPH (benign prostatic hyperplasia)   Plan:  Advance diet Up with therapy Incentive Spirometry Elevate and Apply ice Encouraged him to drink fluids  Weightbearing: WBAT LLE Insicional and dressing care: Dressings left intact until follow-up and Reinforce dressings as needed Orthopedic device(s): None Showering: Keep dressing dry VTE prophylaxis: Lovenox 40mg  qd  while inpatient, can switch to ASA 81mg  bid x 30 days once d/c , SCDs, ambulation Pain control: Continue current regimen Follow - up plan: 2 weeks Contact information:  Jewel Baize MD, Eulah Pont PA-C  Dispo:  TBD based on PT/OT evals. Came from a ILF. May need to go to SNF for some time.     02/07/21, PA-C Office [phone] 02/06/2021, 1:34 PM

## 2021-02-06 NOTE — Evaluation (Signed)
Physical Therapy Evaluation Patient Details Name: Russell Ayers MRN: [id number] DOB: Sep 21, 1932 Today's Date: 02/06/2021  History of Present Illness  Pt s/p fall with L hip fx and now s/p IM nailing.  Pt with hx of DM, anxiety and CVA (with residual dysphagia).  Clinical Impression  Pt admitted as above and presenting with functional mobility limitations 2* decreased L LE strength/ROM, post op pain and balance deficits limiting functional mobility.  Pt would benefit from follow up rehab at SNF level to maximize IND and safety prior to return to IND living at [facility].  Pt states his facility has a rehab center and his hope is to dc to same.     Recommendations for follow up therapy are one component of a multi-disciplinary discharge planning process, led by the attending physician.  Recommendations may be updated based on patient status, additional functional criteria and insurance authorization.  Follow Up Recommendations SNF    Equipment Recommendations  Rolling walker with 5" wheels    Recommendations for Other Services       Precautions / Restrictions Precautions Precautions: Fall Precaution Comments: h/o seizures Restrictions Weight Bearing Restrictions: No LLE Weight Bearing: Weight bearing as tolerated      Mobility  Bed Mobility Overal bed mobility: Needs Assistance Bed Mobility: Supine to Sit     Supine to sit: Min assist;Mod assist;+2 for physical assistance;+2 for safety/equipment     General bed mobility comments: Increased time with cues for sequence and use of R LE to self assist.  Physical assist to mange L LE and to control trunk.    Transfers Overall transfer level: Needs assistance Equipment used: Rolling walker (2 wheeled) Transfers: Sit to/from Stand Sit to Stand: Min assist;Mod assist;+2 physical assistance;+2 safety/equipment;From elevated surface         General transfer comment: cues for LE management and use of UEs to self assist.   Physical assist to bring wt up and fwd and to balance in standing.  Ambulation/Gait Ambulation/Gait assistance: Min assist;+2 physical assistance;+2 safety/equipment Gait Distance (Feet): 3 Feet Assistive device: Rolling walker (2 wheeled) Gait Pattern/deviations: Step-to pattern;Decreased step length - right;Decreased step length - left;Shuffle;Trunk flexed Gait velocity: decr   General Gait Details: cues for sequence, posture and position from RW.  Distance ltd by [employer] (Stroke Patients Only)       Balance Overall balance assessment: Needs assistance Sitting-balance support: Bilateral upper extremity supported;No upper extremity supported Sitting balance-Leahy Scale: Fair     Standing balance support: Bilateral upper extremity supported Standing balance-Leahy Scale: Poor                               Pertinent Vitals/Pain Pain Assessment: 0-10 Pain Score: 5  Pain Location: L hip Pain Descriptors / Indicators: Aching;Sore Pain Intervention(s): Limited activity within patient's tolerance;Monitored during session;Premedicated before session;Ice applied    Home Living Family/patient expects to be discharged to:: Private residence Living Arrangements: Alone Available Help at Discharge: Family;Available PRN/intermittently Type of Home: House Home Access: Level entry     Home Layout: One level Home Equipment: Cane - single point;Wheelchair - manual Additional Comments: [facility]. daughter comes to visit frequently.    Prior Function Level of Independence: Independent with assistive device(s)         Comments: patient uses  cane for mobility. patient reported doing his own medication management, grocery shopping and driving prior to fall.     Hand Dominance   Dominant Hand: Right    Extremity/Trunk Assessment   Upper Extremity  Assessment Upper Extremity Assessment: Defer to OT evaluation    Lower Extremity Assessment Lower Extremity Assessment: LLE deficits/detail LLE: Unable to fully assess due to pain    Cervical / Trunk Assessment Cervical / Trunk Assessment: Kyphotic  Communication   Communication: HOH  Cognition Arousal/Alertness: Awake/alert Behavior During Therapy: WFL for tasks assessed/performed Overall Cognitive Status: Within Functional Limits for tasks assessed                                        General Comments      Exercises General Exercises - Lower Extremity Ankle Circles/Pumps: AROM;Both;15 reps;Supine   Assessment/Plan    PT Assessment Patient needs continued PT services  PT Problem List Decreased strength;Decreased range of motion;Decreased activity tolerance;Decreased balance;Decreased mobility;Decreased knowledge of use of DME;Pain       PT Treatment Interventions DME instruction;Gait training;Stair training;Functional mobility training;Therapeutic activities;Therapeutic exercise;Balance training;Patient/family education    PT Goals (Current goals can be found in the Care Plan section)  Acute Rehab PT Goals Patient Stated Goal: Regain IND PT Goal Formulation: With patient Time For Goal Achievement: 02/20/21 Potential to Achieve Goals: Good    Frequency Min 5X/week   Barriers to discharge        Co-evaluation PT/OT/SLP Co-Evaluation/Treatment: Yes Reason for Co-Treatment: For patient/therapist safety PT goals addressed during session: Mobility/safety with mobility OT goals addressed during session: ADL's and self-care       AM-PAC PT "6 Clicks" Mobility  Outcome Measure Help needed turning from your back to your side while in a flat bed without using bedrails?: A Lot Help needed moving from lying on your back to sitting on the side of a flat bed without using bedrails?: A Lot Help needed moving to and from a bed to a chair (including a  wheelchair)?: A Lot Help needed standing up from a chair using your arms (e.g., wheelchair or bedside chair)?: A Lot Help needed to walk in hospital room?: A Lot Help needed climbing 3-5 steps with a railing? : Total 6 Click Score: 11    End of Session Equipment Utilized During Treatment: Gait belt Activity Tolerance: Patient tolerated treatment well Patient left: in chair;with call bell/phone within reach;with chair alarm set;with nursing/sitter in room Nurse Communication: Mobility status PT Visit Diagnosis: Difficulty in walking, not elsewhere classified (R26.2);Pain;Unsteadiness on feet (R26.81);History of falling (Z91.81) Pain - Right/Left: Left Pain - part of body: Hip    Time: 1610-9604 PT Time Calculation (min) (ACUTE ONLY): 36 min   Charges:   PT Evaluation $PT Eval Low Complexity: 1 Low          Mauro Kaufmann PT Acute Rehabilitation Services Pager [phone] Office [phone]   Donnis Pecha 02/06/2021, 12:59 PM

## 2021-02-07 ENCOUNTER — Encounter (HOSPITAL_COMMUNITY): Payer: Self-pay | Admitting: Orthopedic Surgery

## 2021-02-07 DIAGNOSIS — Z8781 Personal history of (healed) traumatic fracture: Secondary | ICD-10-CM | POA: Diagnosis not present

## 2021-02-07 LAB — CBC
HCT: 29 % — ABNORMAL LOW (ref 39.0–52.0)
Hemoglobin: 9.5 g/dL — ABNORMAL LOW (ref 13.0–17.0)
MCH: 31.4 pg (ref 26.0–34.0)
MCHC: 32.8 g/dL (ref 30.0–36.0)
MCV: 95.7 fL (ref 80.0–100.0)
Platelets: 136 10*3/uL — ABNORMAL LOW (ref 150–400)
RBC: 3.03 MIL/uL — ABNORMAL LOW (ref 4.22–5.81)
RDW: 13.1 % (ref 11.5–15.5)
WBC: 7.4 10*3/uL (ref 4.0–10.5)
nRBC: 0 % (ref 0.0–0.2)

## 2021-02-07 LAB — GLUCOSE, CAPILLARY
Glucose-Capillary: 151 mg/dL — ABNORMAL HIGH (ref 70–99)
Glucose-Capillary: 171 mg/dL — ABNORMAL HIGH (ref 70–99)
Glucose-Capillary: 136 mg/dL — ABNORMAL HIGH (ref 70–99)
Glucose-Capillary: 235 mg/dL — ABNORMAL HIGH (ref 70–99)

## 2021-02-07 LAB — SARS CORONAVIRUS 2 (TAT 6-24 HRS): SARS Coronavirus 2: NEGATIVE

## 2021-02-07 MED ORDER — CHLORHEXIDINE GLUCONATE CLOTH 2 % EX PADS
6.0000 | MEDICATED_PAD | Freq: Every day | CUTANEOUS | Status: DC
  Administered 2021-02-07 – 2021-02-08 (×2): 6 via TOPICAL

## 2021-02-07 MED ORDER — ASPIRIN EC 81 MG PO TBEC: 81.0000 mg | DELAYED_RELEASE_TABLET | Freq: Two times a day (BID) | ORAL | 0 refills | Status: AC

## 2021-02-07 MED ORDER — LORAZEPAM 2 MG PO TABS: 2.0000 mg | ORAL_TABLET | Freq: Every evening | ORAL | 0 refills | Status: DC | PRN

## 2021-02-07 MED ORDER — HYDROCODONE-ACETAMINOPHEN 7.5-325 MG PO TABS: 1.0000 | ORAL_TABLET | ORAL | 0 refills | Status: DC | PRN

## 2021-02-07 MED ORDER — TEMAZEPAM 30 MG PO CAPS: 30.0000 mg | ORAL_CAPSULE | Freq: Every day | ORAL | 0 refills | Status: DC

## 2021-02-07 NOTE — Progress Notes (Signed)
Patient up to bathroom had large amount of bloody urine Dr Renford Dills notified  D Susann Givens RN

## 2021-02-07 NOTE — Consult Note (Signed)
Subjective: 1. Closed fracture of left hip, initial encounter (HCC)   2. Fracture      Consult requested by Dr. Burnadette Pop.  Mr. Russell Ayers is an 87 yo male who had a left his ORIF on 9/17 following a fall.   He had a foley catheter placed today and had some hematuria noted after that.  He had been on Lovenox for DVT prophylaxis.   The urine is now clear in the foley tubing.  He has a history of BPH with BOO and is followed by Dr. Liliane Ayers in our office with his last visit on 11/01/20 to assess his response to the addition of tamsulosin.   He remains on tamsulosin and finasteride and generally voids incontinently into a diaper at home and he was using a condom catheter until the foley was placed today with initial drainage of documented.  He has a history of an elevated PVR that has been in the range.  He also has a prior history of UTI's.  He hasn't had a recent UA or culture.  He has a remote history of stones but a CT in 3/22 showed only moderate bladder distention.    ROS:  Review of Systems  Genitourinary:        He is chronically incontinent.   Musculoskeletal:  Positive for joint pain (left hip).   No Known Allergies  Past Medical History:  Diagnosis Date  . Diabetes mellitus without complication (HCC)   . Generalized anxiety disorder   . Glaucoma   . Hypertension   . Inguinal hernia   . Insomnia   . Prostatism   . Stroke [facility])     Past Surgical History:  Procedure Laterality Date  . CATARACT EXTRACTION, BILATERAL    . FEMUR IM NAIL Left 02/05/2021   Procedure: INTRAMEDULLARY (IM) NAIL FEMORAL;  Surgeon: Russell Apley, MD;  Location: [facility];  Service: Orthopedics;  Laterality: Left;  . glaucoma      Social History   Socioeconomic History  . Marital status: Divorced    Spouse name: Not on file  . Number of children: 1  . Years of education: Not on file  . Highest education level: Not on file  Occupational History  . Occupation: retired Air cabin crew  Tobacco Use  . Smoking status: Former    Types: Cigarettes    Quit date: 02/18/1989    Years since quitting: 31.9  . Smokeless tobacco: Never  Substance and Sexual Activity  . Alcohol use: Never  . Drug use: Never  . Sexual activity: Not on file  Other Topics Concern  . Not on file  Social History Narrative  . Not on file   Social Determinants of Health   Financial Resource Strain: Not on file  Food Insecurity: Not on file  Transportation Needs: Not on file  Physical Activity: Not on file  Stress: Not on file  Social Connections: Not on file  Intimate Partner Violence: Not on file    Family History  Problem Relation Age of Onset  . Cancer Brother   . Heart attack Brother     Anti-infectives: Anti-infectives (From admission, onward)    Start     Dose/Rate Route Frequency Ordered Stop   02/05/21 1300  ceFAZolin (ANCEF) IVPB 2g/100 mL premix        2 g 200 mL/hr over 30 Minutes Intravenous Every 6 hours 02/05/21 1011 02/05/21 1827   02/05/21 0649  ceFAZolin (ANCEF) 2-4 GM/100ML-% IVPB  Note to Pharmacy: Crista Elliot   : cabinet override      02/05/21 0649 02/05/21 0802   02/05/21 0600  ceFAZolin (ANCEF) IVPB 2g/100 mL premix        2 g 200 mL/hr over 30 Minutes Intravenous On call to O.R. 02/05/21 0158 02/05/21 2951       Current Facility-Administered Medications  Medication Dose Route Frequency Provider Last Rate Last Admin  . acetaminophen (TYLENOL) tablet 325-650 mg  325-650 mg Oral Q6H PRN Levester Fresh M, PA-C      . alum & mag hydroxide-simeth (MAALOX/MYLANTA) 200-200-20 MG/5ML suspension 30 mL  30 mL Oral Q4H PRN Gawne, Meghan M, PA-C      . benazepril (LOTENSIN) tablet 10 mg  10 mg Oral Daily Levester Fresh M, PA-C   10 mg at 02/07/21 0942  . bisacodyl (DULCOLAX) suppository 10 mg  10 mg Rectal Daily PRN Levester Fresh M, PA-C      . Chlorhexidine Gluconate Cloth 2 % PADS 6 each  6 each Topical Daily Burnadette Pop, MD   6 each at 02/07/21 1753   . docusate sodium (COLACE) capsule 100 mg  100 mg Oral BID Levester Fresh M, PA-C   100 mg at 02/07/21 8841  . feeding supplement (ENSURE ENLIVE / ENSURE PLUS) liquid 237 mL  237 mL Oral BID BM Adhikari, Amrit, MD   237 mL at 02/07/21 1754  . finasteride (PROSCAR) tablet 5 mg  5 mg Oral Daily Levester Fresh M, PA-C   5 mg at 02/07/21 6606  . fluticasone (FLONASE) 50 MCG/ACT nasal spray 1-2 spray  1-2 spray Each Nare Daily PRN Levester Fresh M, PA-C      . HYDROcodone-acetaminophen (NORCO) 7.5-325 MG per tablet 1-2 tablet  1-2 tablet Oral Q4H PRN Levester Fresh M, PA-C      . HYDROcodone-acetaminophen (NORCO/VICODIN) 5-325 MG per tablet 1-2 tablet  1-2 tablet Oral Q4H PRN Jenne Pane, PA-C   1 tablet at 02/07/21 0950  . insulin aspart (novoLOG) injection 0-15 Units  0-15 Units Subcutaneous TID WC Burnadette Pop, MD   2 Units at 02/07/21 1754  . insulin aspart (novoLOG) injection 0-5 Units  0-5 Units Subcutaneous QHS Adhikari, Amrit, MD      . latanoprost (XALATAN) 0.005 % ophthalmic solution 1 drop  1 drop Both Eyes QHS Levester Fresh M, PA-C   1 drop at 02/06/21 2153  . LORazepam (ATIVAN) tablet 2 mg  2 mg Oral QHS PRN Burnadette Pop, MD   2 mg at 02/06/21 2140  . menthol-cetylpyridinium (CEPACOL) lozenge 3 mg  1 lozenge Oral PRN Gawne, Meghan M, PA-C       Or  . phenol (CHLORASEPTIC) mouth spray 1 spray  1 spray Mouth/Throat PRN Gawne, Lindie Spruce M, PA-C      . methocarbamol (ROBAXIN) tablet 500 mg  500 mg Oral Q6H PRN Levester Fresh M, PA-C   500 mg at 02/06/21 2141   Or  . methocarbamol (ROBAXIN) 500 mg in dextrose 5 % 50 mL IVPB  500 mg Intravenous Q6H PRN Gawne, Meghan M, PA-C      . metoCLOPramide (REGLAN) tablet 5-10 mg  5-10 mg Oral Q8H PRN Gawne, Meghan M, PA-C       Or  . metoCLOPramide (REGLAN) injection 5-10 mg  5-10 mg Intravenous Q8H PRN Gawne, Meghan M, PA-C      . metoprolol tartrate (LOPRESSOR) injection 5 mg  5 mg Intravenous Q6H PRN Levester Fresh M, PA-C      .  morphine 2 MG/ML  injection 0.5-1 mg  0.5-1 mg Intravenous Q2H PRN Levester Fresh M, PA-C      . ondansetron (ZOFRAN) tablet 4 mg  4 mg Oral Q6H PRN Gawne, Meghan M, PA-C       Or  . ondansetron (ZOFRAN) injection 4 mg  4 mg Intravenous Q6H PRN Gawne, Meghan M, PA-C      . pantoprazole (PROTONIX) EC tablet 40 mg  40 mg Oral Daily Levester Fresh M, PA-C   40 mg at 02/07/21 0942  . polyethylene glycol (MIRALAX / GLYCOLAX) packet 17 g  17 g Oral Daily PRN Gawne, Lindie Spruce M, PA-C      . psyllium (HYDROCIL/METAMUCIL) 1 packet  1 packet Oral QPM Jenne Pane, PA-C   1 packet at 02/06/21 1641  . tamsulosin (FLOMAX) capsule 0.4 mg  0.4 mg Oral QPM Levester Fresh M, PA-C   0.4 mg at 02/06/21 1642  . temazepam (RESTORIL) capsule 30 mg  30 mg Oral QHS Levester Fresh M, PA-C   30 mg at 02/06/21 2140  . timolol (TIMOPTIC) 0.5 % ophthalmic solution 1 drop  1 drop Both Eyes QPM Levester Fresh M, PA-C   1 drop at 02/06/21 1642  . vitamin B-12 (CYANOCOBALAMIN) tablet 100 mcg  100 mcg Oral Daily Levester Fresh M, PA-C   100 mcg at 02/07/21 9562     Objective: Vital signs in last 24 hours: BP (!) 124/47 (BP Location: Right Arm)   Pulse (!) 57   Temp 98.2 F (36.8 C) (Oral)   Resp 19   Ht 5\' 11"  (1.803 m)   Wt 65.5 kg   SpO2 100%   BMI 20.14 kg/m   Intake/Output from previous day: 09/18 0701 - 09/19 0700 In: 1800 [P.O.:1800] Out: 1300 [Urine:1300] Intake/Output this shift: Total I/O In: 1080 [P.O.:1080] Out: 1400 [Urine:1400]   Physical Exam Vitals reviewed.  Constitutional:      Comments: Elderly, feeble WM resting comfortably in bed.   Abdominal:     General: Abdomen is flat.     Palpations: Abdomen is soft.  Genitourinary:    Comments: Foley is draining clear urine.  Neurological:     General: No focal deficit present.     Mental Status: He is oriented to person, place, and time.    Lab Results:  Results for orders placed or performed during the hospital encounter of 02/04/21 (from the past 24 hour(s))   Glucose, capillary     Status: Abnormal   Collection Time: 02/06/21  9:31 PM  Result Value Ref Range   Glucose-Capillary 145 (H) 70 - 99 mg/dL  CBC     Status: Abnormal   Collection Time: 02/07/21  2:55 AM  Result Value Ref Range   WBC 7.4 4.0 - 10.5 K/uL   RBC 3.03 (L) 4.22 - 5.81 MIL/uL   Hemoglobin 9.5 (L) 13.0 - 17.0 g/dL   HCT 02/09/21 (L) 13.0 - 86.5 %   MCV 95.7 80.0 - 100.0 fL   MCH 31.4 26.0 - 34.0 pg   MCHC 32.8 30.0 - 36.0 g/dL   RDW 78.4 69.6 - 29.5 %   Platelets 136 (L) 150 - 400 K/uL   nRBC 0.0 0.0 - 0.2 %  Glucose, capillary     Status: Abnormal   Collection Time: 02/07/21  7:59 AM  Result Value Ref Range   Glucose-Capillary 151 (H) 70 - 99 mg/dL  Glucose, capillary     Status: Abnormal   Collection Time: 02/07/21 12:20 PM  Result Value Ref Range   Glucose-Capillary 171 (H) 70 - 99 mg/dL  Glucose, capillary     Status: Abnormal   Collection Time: 02/07/21  5:01 PM  Result Value Ref Range   Glucose-Capillary 136 (H) 70 - 99 mg/dL    BMET Recent Labs    02/05/21 0122 02/06/21 0306  NA 138 138  K 3.7 4.2  CL 102 106  CO2 26 27  GLUCOSE 156* 160*  BUN 13 14  CREATININE 0.94 0.67  CALCIUM 8.6* 8.7*   PT/INR Recent Labs    02/04/21 2015  LABPROT 13.3  INR 1.0   ABG No results for input(s): PHART, HCO3 in the last 72 hours.  Invalid input(s): PCO2, PO2  Studies/Results: I have reviewed his prior office notes from Dr. Liliane Ayers and his pertinent hospital records.    Assessment/Plan: Gross hematuria.  This is likely secondary to foley placement and anticoagulation for DVT prophylaxis.  The hematuria has resolved.  BPH with obstruction and incomplete emptying with incontinence.  He remains on tamsulosin and finasteride and had been voiding into a condom cath in the hospital and diapers at home.   It would probably be best to continue the foley to ease nursing care for the next 3-4 weeks and he could f/u in our office for a voiding trial depending on his  recovery.    History of UTI's.   I would recommend getting a urine culture as infections could contribute to the bleeding.         No follow-ups on file.    CC: Dr. Burnadette Pop and Dr. Rhoderick Moody.      Russell Ayers 02/07/2021 [phone]

## 2021-02-07 NOTE — TOC Initial Note (Signed)
Transition of Care [facility]) - Initial/Assessment Note   Patient Details  Name: Russell Ayers MRN: [id number] Date of Birth: [birth year]  [facility] CM/SW Contact:    Ewing Schlein, LCSW Phone Number: 02/07/2021, 9:27 AM  Clinical Narrative: Patient is from independent living at [facility] in [place]. PT recommended SNF and CSW confirmed with Meryle Ready the SNF can accept the patient tomorrow pending negative COVID test.  FL2 done; PASRR confirmed. Initial referral and PT notes faxed to [facility]. Patient aware he will discharge tomorrow. TOC to follow.  Expected Discharge Plan: Skilled Nursing Facility Barriers to Discharge: No Barriers Identified  Patient Goals and CMS Choice Patient states their goals for this hospitalization and ongoing recovery are:: Go to [facility] SNF for rehab CMS Medicare.gov Compare Post Acute Care list provided to:: Patient Choice offered to / list presented to : Patient  Expected Discharge Plan and Services Expected Discharge Plan: Skilled Nursing Facility In-house Referral: Clinical Social Work Post Acute Care Choice: Skilled Nursing Facility Living arrangements for the past 2 months: Independent Living Facility            DME Arranged: N/A DME Agency: NA  Prior Living Arrangements/Services Living arrangements for the past 2 months: Independent Living Facility Lives with:: Self Patient language and need for interpreter reviewed:: Yes Do you feel safe going back to the place where you live?: Yes      Need for Family Participation in Patient Care: No (Comment) Care giver support system in place?: Yes (comment) Criminal Activity/Legal Involvement Pertinent to Current Situation/Hospitalization: No - Comment as needed  Activities of Daily Living Home Assistive Devices/Equipment: Eyeglasses, Dentures (specify type) ADL Screening (condition at time of admission) Patient's cognitive ability adequate to safely complete daily activities?:  Yes Is the patient deaf or have difficulty hearing?: No Does the patient have difficulty seeing, even when wearing glasses/contacts?: No Does the patient have difficulty concentrating, remembering, or making decisions?: No Patient able to express need for assistance with ADLs?: Yes Does the patient have difficulty dressing or bathing?: Yes Independently performs ADLs?: No Communication: Independent Dressing (OT): Needs assistance Is this a change from baseline?: Change from baseline, expected to last >3 days Grooming: Needs assistance Is this a change from baseline?: Change from baseline, expected to last <3 days Feeding: Independent Bathing: Needs assistance Is this a change from baseline?: Change from baseline, expected to last >3 days Toileting: Needs assistance Is this a change from baseline?: Change from baseline, expected to last >3days In/Out Bed: Needs assistance Is this a change from baseline?: Change from baseline, expected to last >3 days Walks in Home: Needs assistance Is this a change from baseline?: Change from baseline, expected to last >3 days Does the patient have difficulty walking or climbing stairs?: Yes Weakness of Legs: Left Weakness of Arms/Hands: None  Permission Sought/Granted Permission sought to share information with : Facility Industrial/product designer granted to share information with : Yes, Verbal Permission Granted Permission granted to share info w AGENCY: [facility]  Emotional Assessment Attitude/Demeanor/Rapport: Engaged Affect (typically observed): Accepting Orientation: : Oriented to Self, Oriented to Place, Oriented to  Time, Oriented to Situation Alcohol / Substance Use: Not Applicable Psych Involvement: No (comment)  Admission diagnosis:  Closed fracture of left hip, initial encounter (HCC) [S72.002A] S/p left hip fracture [Z87.81] Patient Active Problem List   Diagnosis Date Noted   Prediabetes 02/05/2021   BPH (benign  prostatic hyperplasia) 02/05/2021   S/p left hip fracture 02/04/2021   Left  lower quadrant abdominal pain    Seizure (HCC)    SBO (small bowel obstruction) (HCC) 07/26/2020   Essential hypertension 07/26/2020   Diabetic neuropathy (HCC) 04/30/2020   Pain due to onychomycosis of toenails of both feet 11/29/2018   PCP:  Soundra Pilon, FNP Pharmacy:   [facility] [phone] - [place], [place] - [address] [place] [address] Phone: [phone] Fax: [phone]  Readmission Risk Interventions No flowsheet data found.

## 2021-02-07 NOTE — Progress Notes (Signed)
Subjective: Patient reports pain as moderate. Not totally controlled with medicine. Has dentures and had trouble eating the hard foods. Better tolerating soft diet now. Urinating. Looks like condom cath was removed. No CP, SOB. Still working on improving his mobilization OOB with PT/OT.  Objective:   VITALS:   Vitals:   02/06/21 1337 02/06/21 2123 02/07/21 0607 02/07/21 1313  BP: (!) 109/53 (!) 136/58 (!) 139/58 (!) 124/47  Pulse: (!) 107 93 98 (!) 57  Resp: 20 16 16 19   Temp: 98 F (36.7 C) 98.1 F (36.7 C) 98.1 F (36.7 C) 98.2 F (36.8 C)  TempSrc: Oral Oral Oral Oral  SpO2: 97% 100% 96% 100%  Weight:      Height:       CBC Latest Ref Rng & Units 02/07/2021 02/06/2021 02/05/2021  WBC 4.0 - 10.5 K/uL 7.4 7.7 9.8  Hemoglobin 13.0 - 17.0 g/dL 02/07/2021) 1.6(X) 11.4(L)  Hematocrit 39.0 - 52.0 % 29.0(L) 27.9(L) 33.7(L)  Platelets 150 - 400 K/uL 136(L) 126(L) 174   BMP Latest Ref Rng & Units 02/06/2021 02/05/2021 02/04/2021  Glucose 70 - 99 mg/dL 02/06/2021) 045(W) 098(J)  BUN 8 - 23 mg/dL 14 13 13   Creatinine 0.61 - 1.24 mg/dL 191(Y 7.82  BUN/Creat Ratio 10 - 24 - - -  Sodium 135 - 145 mmol/L 138 138 142  Potassium 3.5 - 5.1 mmol/L 4.2 3.7 3.6  Chloride 98 - 111 mmol/L 106 102 107  CO2 22 - 32 mmol/L 27 26 27   Calcium 8.9 - 10.3 mg/dL 9.56) 2.13) 9.6   Intake/Output      09/18 0701 09/19 0700 09/19 0701 09/20 0700   P.O. 1800 500   I.V. (mL/kg)     IV Piggyback     Total Intake(mL/kg) 1800 (27.5) 500 (7.6)   Urine (mL/kg/hr) 1300 (0.8) 600 (1.4)   Stool 0 0   Blood     Total Output 1300 600   Net +500 -100        Urine Occurrence 3 x 1 x   Stool Occurrence 1 x 1 x      Physical Exam: General: NAD.  Sitting up in bed on the phone when I walked in. Calm. Resp: No increased wob Cardio: regular rate and rhythm ABD soft Neurologically intact MSK Neurovascularly intact Sensation intact distally Intact pulses distally Dorsiflexion/Plantar flexion  intact Incision: dressing C/D/I   Assessment: 2 Days Post-Op  S/P Procedure(s) (LRB): INTRAMEDULLARY (IM) NAIL FEMORAL (Left) by Dr. 10/19. 10/19 on 02/05/21  Active Problems:   Essential hypertension   Seizure (HCC)   S/p left hip fracture   Prediabetes   BPH (benign prostatic hyperplasia)   Plan:  Advance diet Up with therapy Incentive Spirometry Elevate and Apply ice  Weightbearing: WBAT LLE Insicional and dressing care: Dressings left intact until follow-up and Reinforce dressings as needed Orthopedic device(s): None Showering: Keep dressing dry VTE prophylaxis: Lovenox 40mg  qd  while inpt, ok to switch to ASA 81mg  bid x 30 days upon d/c , SCDs, ambulation Pain control: Continue current regimen Follow - up plan: 2 weeks post op in the office Contact information:  Jewel Baize MD, Eulah Pont PA-C  Dispo: Skilled Nursing Facility/Rehab tomorrow based on notes. Appears his ASA and pain medicine scripts were already taken care of by medicine team. If not, let me know and I will print and sign them prior to him being d/c tomorrow.    02/07/21, PA-C Office [phone] 02/07/2021, 1:36  PM  

## 2021-02-07 NOTE — Care Management Important Message (Signed)
Important Message  Patient Details IM Letter placed in Patient's room. Name: Russell Ayers MRN: [id number] Date of Birth: 1932-07-10   Medicare Important Message Given:  Yes     Caren Macadam 02/07/2021, 2:20 PM

## 2021-02-07 NOTE — Discharge Summary (Addendum)
Physician Discharge Summary  Jaret R Angola VOJ:[id number] DOB: Aug 16, 1932 DOA: 02/04/2021  PCP: Soundra Pilon, FNP  Admit date: 02/04/2021 Discharge date: 02/08/2021  Admitted From: Home Disposition:  SNF  Discharge Condition:Stable CODE STATUS:DNR Diet recommendation: Heart Healthy   Brief/Interim Summary: Patient is 87 year old male with history of hypertension, borderline diabetes who presented from independent living facility after he fell .  He fell to the sidewalk and reported pain on his left hip.  No history of head trauma.  On presentation he was found to have left hip fracture.  Orthopedics consulted and he underwent ORIF .   PT/OT evaluation done, recommended skilled nursing facility on discharge.  Medically stable for discharge as soon as bed is available.  Following problems were addressed during his hospitalization:    Fall/left hip fracture:presented from independent living facility after he fell .  He fell to the sidewalk and reported pain on his left hip.  No history of head trauma.  On presentation he was found to have left hip fracture.  Orthopedics consulted and he underwent ORIF /intramedullary nailing.  PT/OT evaluation done, recommended SN facility on discharge.  DVT prophylaxis with aspirin   frequent PVCs/tachycardia: Observed on presentation.  EKG showed normal sinus rhythm.  Currently heart rate is stable.  EKG did not show any PVCs.  This is normal.   Acute blood loss anemia: Hemoglobin dropped to the range of 9 .  Most likely associated with hip surgery.  Check CBC in a week   Hypertension: On Lotensin.  Currently BP stable   History of prediabetes: Monitor blood sugars   History of BPH/Gross Hematuria: On Proscar, Flomax.Hospital course remarkable for gross hematuria,most likely secondary to foley placement,during surgery.Urology consulted. Foley placed back. Hematuria has resolved,urine is clear now. He needs to follow up with Urology as an outpatient in  3-4 weeks.We have low suspicion for UTI because he doesn't have dysuria,fever or leucocytosis   History of seizure disorder following benzodiazepine withdrawal: On Ativan and temazepam for nighttime.I confirmed with daughter that he takes these on continuous basis.She said that we shd continue both because he had seizures in the past when discontinued   Discharge Diagnoses:  Active Problems:   Essential hypertension   Seizure (HCC)   S/p left hip fracture   Prediabetes   BPH (benign prostatic hyperplasia)    Discharge Instructions  Discharge Instructions     Diet - low sodium heart healthy   Complete by: As directed    Discharge instructions   Complete by: As directed    1)Please take prescribed medications as instructed 2)Follow up with orthopedics in 2 weeks.  Name and number of the provider has been attached 3)Do a  CBC test in a week 4)You are being discharged with foley catheter,please follow up with Urology in 3-4 weeks for the consideration of foley removal   Increase activity slowly   Complete by: As directed    No wound care   Complete by: As directed       Allergies as of 02/08/2021   No Known Allergies      Medication List     TAKE these medications    aspirin EC 81 MG tablet Take 1 tablet (81 mg total) by mouth 2 (two) times daily for 28 days. Swallow whole.   benazepril 10 MG tablet Commonly known as: LOTENSIN Take 10 mg by mouth daily.   diphenhydrAMINE 25 MG tablet Commonly known as: BENADRYL Take 50 mg by mouth in the morning  and at bedtime.   finasteride 5 MG tablet Commonly known as: PROSCAR Take 5 mg by mouth daily.   FISH OIL PO Take 1 capsule by mouth once a week.   fluticasone 50 MCG/ACT nasal spray Commonly known as: FLONASE Place 1-2 sprays into both nostrils daily as needed for allergies.   HYDROcodone-acetaminophen 7.5-325 MG tablet Commonly known as: NORCO Take 1-2 tablets by mouth every 4 (four) hours as needed for severe  pain (pain score 7-10).   latanoprost 0.005 % ophthalmic solution Commonly known as: XALATAN Place 1 drop into both eyes at bedtime.   LORazepam 2 MG tablet Commonly known as: ATIVAN Take 1 tablet (2 mg total) by mouth at bedtime as needed for sleep.   Melatonin 10 MG Tabs Take 40-60 mg by mouth at bedtime as needed (sleep).   polyethylene glycol 17 g packet Commonly known as: MIRALAX / GLYCOLAX Take 17 g by mouth daily as needed for mild constipation.   PROSTATE PO Take 1 capsule by mouth every evening.   PSYLLIUM HUSK PO Take 1 tablet by mouth every evening.   tamsulosin 0.4 MG Caps capsule Commonly known as: FLOMAX Take 0.4 mg by mouth every evening.   temazepam 30 MG capsule Commonly known as: RESTORIL Take 1 capsule (30 mg total) by mouth at bedtime for 5 days.   timolol 0.5 % ophthalmic solution Commonly known as: BETIMOL Place 1 drop into both eyes every evening.   vitamin B-12 100 MCG tablet Commonly known as: CYANOCOBALAMIN Take 100 mcg by mouth daily.         Contact information for follow-up providers     Sheral Apley, MD. Schedule an appointment as soon as possible for a visit in 2 week(s).   Specialty: Orthopedic Surgery Contact information: [address] [place] [address] [phone]         Bjorn Pippin, MD. Schedule an appointment as soon as possible for a visit in 3 week(s).   Specialty: Urology Contact information: [address] [place] [address] [phone]              Contact information for after-discharge care     Destination     [facility] Preferred SNF .   Service: Skilled Nursing Contact information: [address] [place] [address] [phone]                    No Known Allergies  Consultations: Orthopedics,urology   Procedures/Studies: DG C-Arm 1-60 Min-No Report  Result Date: 02/05/2021 Fluoroscopy was  utilized by the requesting physician.  No radiographic interpretation.   DG Hip Unilat W or Wo Pelvis 2-3 Views Left  Result Date: 02/04/2021 CLINICAL DATA:  Hip pain after fall EXAM: DG HIP (WITH OR WITHOUT PELVIS) 2-3V LEFT COMPARISON:  None. FINDINGS: Pubic symphysis appears intact. The SI joints are non widened. Acute displaced basicervical fracture without femoral head dislocation. IMPRESSION: Acute displaced basicervical fracture without femoral head displacement. Electronically Signed   By: Jasmine Pang M.D.   On: 02/04/2021 19:05   DG FEMUR MIN 2 VIEWS LEFT  Result Date: 02/05/2021 CLINICAL DATA:  ORIF LEFT femur fracture. EXAM: LEFT FEMUR 2 VIEWS COMPARISON:  02/04/2021 FINDINGS: Three intraoperative spot views of the LEFT femur are submitted postoperatively for interpretation. The visualized IM nail/screw is noted traversing a proximal LEFT femur fracture. No gross complicating features are noted on these views. IMPRESSION: ORIF LEFT femur fracture. Electronically  Signed   By: Harmon Pier M.D.   On: 02/05/2021 10:49      Subjective:  Patient seen and examined at the bedside this morning.  Medically stable for discharge.Urine is clear on foley bag. I called her daughter about discharge plan, call not received.  Discharge Exam: Vitals:   02/07/21 2150 02/08/21 0432  BP: (!) 138/57 (!) 146/59  Pulse: 98 95  Resp: 16 16  Temp: 98.2 F (36.8 C) 98.2 F (36.8 C)  SpO2: 95% 98%   Vitals:   02/07/21 0607 02/07/21 1313 02/07/21 2150 02/08/21 0432  BP: (!) 139/58 (!) 124/47 (!) 138/57 (!) 146/59  Pulse: 98 (!) 57 98 95  Resp: 16 19 16 16   Temp: 98.1 F (36.7 C) 98.2 F (36.8 C) 98.2 F (36.8 C) 98.2 F (36.8 C)  TempSrc: Oral Oral Oral Oral  SpO2: 96% 100% 95% 98%  Weight:      Height:        General: Pt is alert, awake, not in acute distress Cardiovascular: RRR, S1/S2 +, no rubs, no gallops Respiratory: CTA bilaterally, no wheezing, no rhonchi Abdominal: Soft, NT, ND,  bowel sounds + Extremities: no edema, no cyanosis, surgical wound on the left hip    The results of significant diagnostics from this hospitalization (including imaging, microbiology, ancillary and laboratory) are listed below for reference.     Microbiology: Recent Results (from the past 240 hour(s))  Resp Panel by RT-PCR (Flu A&B, Covid) Nasopharyngeal Swab     Status: None   Collection Time: 02/04/21  8:24 PM   Specimen: Nasopharyngeal Swab; Nasopharyngeal(NP) swabs in vial transport medium  Result Value Ref Range Status   SARS Coronavirus 2 by RT PCR NEGATIVE NEGATIVE Final    Comment: (NOTE) SARS-CoV-2 target nucleic acids are NOT DETECTED.  The SARS-CoV-2 RNA is generally detectable in upper respiratory specimens during the acute phase of infection. The lowest concentration of SARS-CoV-2 viral copies this assay can detect is 138 copies/mL. A negative result does not preclude SARS-Cov-2 infection and should not be used as the sole basis for treatment or other patient management decisions. A negative result may occur with  improper specimen collection/handling, submission of specimen other than nasopharyngeal swab, presence of viral mutation(s) within the areas targeted by this assay, and inadequate number of viral copies(<138 copies/mL). A negative result must be combined with clinical observations, patient history, and epidemiological information. The expected result is Negative.  Fact Sheet for Patients:  02/06/21  Fact Sheet for Healthcare Providers:  BloggerCourse.com  This test is no t yet approved or cleared by the [place] FDA and  has been authorized for detection and/or diagnosis of SARS-CoV-2 by FDA under an Emergency Use Authorization (EUA). This EUA will remain  in effect (meaning this test can be used) for the duration of the COVID-19 declaration under Section 564(b)(1) of the Act,  21 U.S.C.section 360bbb-3(b)(1), unless the authorization is terminated  or revoked sooner.       Influenza A by PCR NEGATIVE NEGATIVE Final   Influenza B by PCR NEGATIVE NEGATIVE Final    Comment: (NOTE) The Xpert Xpress SARS-CoV-2/FLU/RSV plus assay is intended as an aid in the diagnosis of influenza from Nasopharyngeal swab specimens and should not be used as a sole basis for treatment. Nasal washings and aspirates are unacceptable for Xpert Xpress SARS-CoV-2/FLU/RSV testing.  Fact Sheet for Patients: [place]  Fact Sheet for Healthcare Providers: BloggerCourse.com  This test is not yet approved or cleared by the [employer]  States FDA and has been authorized for detection and/or diagnosis of SARS-CoV-2 by FDA under an Emergency Use Authorization (EUA). This EUA will remain in effect (meaning this test can be used) for the duration of the COVID-19 declaration under Section 564(b)(1) of the Act, 21 U.S.C. section 360bbb-3(b)(1), unless the authorization is terminated or revoked.  Performed at [facility], [address] [place], [place] [address]   Surgical PCR screen     Status: None   Collection Time: 02/05/21 12:53 AM   Specimen: Nasal Mucosa; Nasal Swab  Result Value Ref Range Status   MRSA, PCR NEGATIVE NEGATIVE Final   Staphylococcus aureus NEGATIVE NEGATIVE Final    Comment: (NOTE) The Xpert SA Assay (FDA approved for NASAL specimens in patients 47 years of age and older), is one component of a comprehensive surveillance program. It is not intended to diagnose infection nor to guide or monitor treatment. Performed at [facility], [address] [place], [place] [address]   SARS CORONAVIRUS 2 (TAT 6-24 HRS) Nasopharyngeal Nasopharyngeal Swab     Status: None   Collection Time: 02/07/21 11:49 AM   Specimen: Nasopharyngeal Swab  Result Value Ref Range Status   SARS  Coronavirus 2 NEGATIVE NEGATIVE Final    Comment: (NOTE) SARS-CoV-2 target nucleic acids are NOT DETECTED.  The SARS-CoV-2 RNA is generally detectable in upper and lower respiratory specimens during the acute phase of infection. Negative results do not preclude SARS-CoV-2 infection, do not rule out co-infections with other pathogens, and should not be used as the sole basis for treatment or other patient management decisions. Negative results must be combined with clinical observations, patient history, and epidemiological information. The expected result is Negative.  Fact Sheet for Patients: HairSlick.no  Fact Sheet for Healthcare Providers: quierodirigir.com  This test is not yet approved or cleared by the [place] FDA and  has been authorized for detection and/or diagnosis of SARS-CoV-2 by FDA under an Emergency Use Authorization (EUA). This EUA will remain  in effect (meaning this test can be used) for the duration of the COVID-19 declaration under Se ction 564(b)(1) of the Act, 21 U.S.C. section 360bbb-3(b)(1), unless the authorization is terminated or revoked sooner.  Performed at [facility], [address] [place], [place] [address]      Labs: BNP (last 3 results) No results for input(s): BNP in the last 8760 hours. Basic Metabolic Panel: Recent Labs  Lab 02/04/21 2015 02/05/21 0122 02/06/21 0306  NA 142 138 138  K 3.6 3.7 4.2  CL 107 102 106  CO2 27 26 27   GLUCOSE 134* 156* 160*  BUN 13 13 14   CREATININE 0.88 0.94 0.67  CALCIUM 9.6 8.6* 8.7*   Liver Function Tests: No results for input(s): AST, ALT, ALKPHOS, BILITOT, PROT, ALBUMIN in the last 168 hours. No results for input(s): LIPASE, AMYLASE in the last 168 hours. No results for input(s): AMMONIA in the last 168 hours. CBC: Recent Labs  Lab 02/04/21 2015 02/05/21 0122 02/06/21 0306 02/07/21 0255 02/08/21 0337  WBC 9.9 9.8 7.7  7.4 6.9  NEUTROABS 8.3*  --   --   --   --   HGB 12.7* 11.4* 9.4* 9.5* 9.0*  HCT 38.3* 33.7* 27.9* 29.0* 26.6*  MCV 95.5 94.7 94.3 95.7 95.0  PLT 189 174 126* 136* 151   Cardiac Enzymes: No results for input(s): CKTOTAL, CKMB, CKMBINDEX, TROPONINI in the last 168 hours. BNP: Invalid input(s): POCBNP CBG: Recent Labs  Lab  02/07/21 0759 02/07/21 1220 02/07/21 1701 02/07/21 2143 02/08/21 0713  GLUCAP 151* 171* 136* 235* 143*   D-Dimer No results for input(s): DDIMER in the last 72 hours. Hgb A1c Recent Labs    02/06/21 0306  HGBA1C 5.8*   Lipid Profile No results for input(s): CHOL, HDL, LDLCALC, TRIG, CHOLHDL, LDLDIRECT in the last 72 hours. Thyroid function studies No results for input(s): TSH, T4TOTAL, T3FREE, THYROIDAB in the last 72 hours.  Invalid input(s): FREET3  Anemia work up No results for input(s): VITAMINB12, FOLATE, FERRITIN, TIBC, IRON, RETICCTPCT in the last 72 hours. Urinalysis    Component Value Date/Time   COLORURINE YELLOW 02/08/2021 0830   APPEARANCEUR CLEAR 02/08/2021 0830   LABSPEC 1.010 02/08/2021 0830   PHURINE 7.0 02/08/2021 0830   GLUCOSEU NEGATIVE 02/08/2021 0830   HGBUR MODERATE (A) 02/08/2021 0830   BILIRUBINUR NEGATIVE 02/08/2021 0830   KETONESUR NEGATIVE 02/08/2021 0830   PROTEINUR NEGATIVE 02/08/2021 0830   NITRITE NEGATIVE 02/08/2021 0830   LEUKOCYTESUR NEGATIVE 02/08/2021 0830   Sepsis Labs Invalid input(s): PROCALCITONIN,  WBC,  LACTICIDVEN Microbiology Recent Results (from the past 240 hour(s))  Resp Panel by RT-PCR (Flu A&B, Covid) Nasopharyngeal Swab     Status: None   Collection Time: 02/04/21  8:24 PM   Specimen: Nasopharyngeal Swab; Nasopharyngeal(NP) swabs in vial transport medium  Result Value Ref Range Status   SARS Coronavirus 2 by RT PCR NEGATIVE NEGATIVE Final    Comment: (NOTE) SARS-CoV-2 target nucleic acids are NOT DETECTED.  The SARS-CoV-2 RNA is generally detectable in upper respiratory specimens  during the acute phase of infection. The lowest concentration of SARS-CoV-2 viral copies this assay can detect is 138 copies/mL. A negative result does not preclude SARS-Cov-2 infection and should not be used as the sole basis for treatment or other patient management decisions. A negative result may occur with  improper specimen collection/handling, submission of specimen other than nasopharyngeal swab, presence of viral mutation(s) within the areas targeted by this assay, and inadequate number of viral copies(<138 copies/mL). A negative result must be combined with clinical observations, patient history, and epidemiological information. The expected result is Negative.  Fact Sheet for Patients:  BloggerCourse.com  Fact Sheet for Healthcare Providers:  SeriousBroker.it  This test is no t yet approved or cleared by the [place] FDA and  has been authorized for detection and/or diagnosis of SARS-CoV-2 by FDA under an Emergency Use Authorization (EUA). This EUA will remain  in effect (meaning this test can be used) for the duration of the COVID-19 declaration under Section 564(b)(1) of the Act, 21 U.S.C.section 360bbb-3(b)(1), unless the authorization is terminated  or revoked sooner.       Influenza A by PCR NEGATIVE NEGATIVE Final   Influenza B by PCR NEGATIVE NEGATIVE Final    Comment: (NOTE) The Xpert Xpress SARS-CoV-2/FLU/RSV plus assay is intended as an aid in the diagnosis of influenza from Nasopharyngeal swab specimens and should not be used as a sole basis for treatment. Nasal washings and aspirates are unacceptable for Xpert Xpress SARS-CoV-2/FLU/RSV testing.  Fact Sheet for Patients: BloggerCourse.com  Fact Sheet for Healthcare Providers: SeriousBroker.it  This test is not yet approved or cleared by the [place] FDA and has been authorized for detection  and/or diagnosis of SARS-CoV-2 by FDA under an Emergency Use Authorization (EUA). This EUA will remain in effect (meaning this test can be used) for the duration of the COVID-19 declaration under Section 564(b)(1) of the Act, 21 U.S.C. section 360bbb-3(b)(1), unless the  authorization is terminated or revoked.  Performed at [facility], [address] [place], [place] [address]   Surgical PCR screen     Status: None   Collection Time: 02/05/21 12:53 AM   Specimen: Nasal Mucosa; Nasal Swab  Result Value Ref Range Status   MRSA, PCR NEGATIVE NEGATIVE Final   Staphylococcus aureus NEGATIVE NEGATIVE Final    Comment: (NOTE) The Xpert SA Assay (FDA approved for NASAL specimens in patients 57 years of age and older), is one component of a comprehensive surveillance program. It is not intended to diagnose infection nor to guide or monitor treatment. Performed at [facility], [address], [place], [place] [address]   SARS CORONAVIRUS 2 (TAT 6-24 HRS) Nasopharyngeal Nasopharyngeal Swab     Status: None   Collection Time: 02/07/21 11:49 AM   Specimen: Nasopharyngeal Swab  Result Value Ref Range Status   SARS Coronavirus 2 NEGATIVE NEGATIVE Final    Comment: (NOTE) SARS-CoV-2 target nucleic acids are NOT DETECTED.  The SARS-CoV-2 RNA is generally detectable in upper and lower respiratory specimens during the acute phase of infection. Negative results do not preclude SARS-CoV-2 infection, do not rule out co-infections with other pathogens, and should not be used as the sole basis for treatment or other patient management decisions. Negative results must be combined with clinical observations, patient history, and epidemiological information. The expected result is Negative.  Fact Sheet for Patients: HairSlick.no  Fact Sheet for Healthcare Providers: quierodirigir.com  This test is not  yet approved or cleared by the [place] FDA and  has been authorized for detection and/or diagnosis of SARS-CoV-2 by FDA under an Emergency Use Authorization (EUA). This EUA will remain  in effect (meaning this test can be used) for the duration of the COVID-19 declaration under Se ction 564(b)(1) of the Act, 21 U.S.C. section 360bbb-3(b)(1), unless the authorization is terminated or revoked sooner.  Performed at [facility], [address] [place], [place] [address]     Please note: You were cared for by a hospitalist during your hospital stay. Once you are discharged, your primary care physician will handle any further medical issues. Please note that NO REFILLS for any discharge medications will be authorized once you are discharged, as it is imperative that you return to your primary care physician (or establish a relationship with a primary care physician if you do not have one) for your post hospital discharge needs so that they can reassess your need for medications and monitor your lab values.    Time coordinating discharge: 40 minutes  SIGNED:   Burnadette Pop, MD  [facility] 02/08/2021, 11:08 AM Pager [phone]  If 7PM-7AM, please contact night-coverage [email or web address] Password [email or web address]

## 2021-02-07 NOTE — Progress Notes (Signed)
Physical Therapy Treatment Patient Details Name: Russell Ayers MRN: [id number] DOB: 1933-03-18 Today's Date: 02/07/2021   History of Present Illness Pt s/p fall with L hip fx and now s/p IM nailing.  Pt with hx of DM, anxiety and CVA (with residual dysphagia).    PT Comments    POD # 2 Pt was OOB incliner.  RN stated pt was bleeding from penis FYI.  Pt required + 2 assist for mobility.  General transfer comment: pt required increased assist from lower recliner level with 50% VC's to push up from armrests vs pull up on walker.  Very limited session due to increased pain and fatigue from effort. General Gait Details: + 2 assist for safety such that recliner was following.  Limited distance due to increased fatigue from effort. Performed a few AROM TE's followed by ICE. Pt will need ST Rehab at SNF prior to returning to his [facility] apartment.   Recommendations for follow up therapy are one component of a multi-disciplinary discharge planning process, led by the attending physician.  Recommendations may be updated based on patient status, additional functional criteria and insurance authorization.  Follow Up Recommendations  SNF     Equipment Recommendations  Rolling walker with 5" wheels    Recommendations for Other Services       Precautions / Restrictions Precautions Precautions: Fall Precaution Comments: h/o seizures Restrictions Weight Bearing Restrictions: No     Mobility  Bed Mobility               General bed mobility comments: OOB in recliner    Transfers Overall transfer level: Needs assistance Equipment used: Rolling walker (2 wheeled) Transfers: Sit to/from Stand Sit to Stand: Mod assist;+2 physical assistance;+2 safety/equipment         General transfer comment: pt required increased assist from lower recliner level with 50% VC's to push up from armrests vs pull up on walker.  Very limited session due to increased pain and fatigue from  effort.  Ambulation/Gait Ambulation/Gait assistance: Min assist;Mod assist Gait Distance (Feet): 18 Feet Assistive device: Rolling walker (2 wheeled) Gait Pattern/deviations: Step-to pattern;Decreased step length - right;Decreased step length - left;Shuffle;Trunk flexed Gait velocity: decreased   General Gait Details: + 2 assist for safety such that recliner was following.  Limited distance due to increased fatigue from effort.   Stairs             Wheelchair Mobility    Modified Rankin (Stroke Patients Only)       Balance                                            Cognition Arousal/Alertness: Awake/alert Behavior During Therapy: WFL for tasks assessed/performed Overall Cognitive Status: Within Functional Limits for tasks assessed                                 General Comments: AxO x 3 very pleasant.  Stated he has been at [facility] approx 6 months and was able to recall how he fell.      Exercises  10 reps AP, knee presses, gluteal squeezes and AAROM HS     General Comments        Pertinent Vitals/Pain Pain Assessment: 0-10 Pain Score: 6  Pain Location: L hip Pain Descriptors /  Indicators: Aching;Sore;Operative site guarding Pain Intervention(s): Monitored during session;Premedicated before session;Repositioned    Home Living                      Prior Function            PT Goals (current goals can now be found in the care plan section) Progress towards PT goals: Progressing toward goals    Frequency    Min 3X/week      PT Plan Current plan remains appropriate    Co-evaluation              AM-PAC PT "6 Clicks" Mobility   Outcome Measure  Help needed turning from your back to your side while in a flat bed without using bedrails?: A Lot Help needed moving from lying on your back to sitting on the side of a flat bed without using bedrails?: A Lot Help needed moving to and from a  bed to a chair (including a wheelchair)?: A Lot Help needed standing up from a chair using your arms (e.g., wheelchair or bedside chair)?: A Lot Help needed to walk in hospital room?: A Lot Help needed climbing 3-5 steps with a railing? : Total 6 Click Score: 11    End of Session Equipment Utilized During Treatment: Gait belt Activity Tolerance: Patient tolerated treatment well Patient left: in chair;with call bell/phone within reach;with chair alarm set;with nursing/sitter in room Nurse Communication: Mobility status PT Visit Diagnosis: Difficulty in walking, not elsewhere classified (R26.2);Pain;Unsteadiness on feet (R26.81);History of falling (Z91.81) Pain - Right/Left: Left Pain - part of body: Hip     Time: 0071-2197 PT Time Calculation (min) (ACUTE ONLY): 23 min  Charges:  $Gait Training: 8-22 mins $Therapeutic Exercise: 8-22 mins                     Felecia Shelling  PTA Acute  Rehabilitation Services Pager      [phone] Office      [phone]

## 2021-02-07 NOTE — NC FL2 (Signed)
[place] MEDICAID FL2 LEVEL OF CARE SCREENING TOOL     IDENTIFICATION  Patient Name: Russell Ayers Birthdate: 08-11-1932 Sex: male Admission Date (Current Location): 02/04/2021  [facility] and [place] Number:  Producer, television/film/video and Address:  [facility],  [address] [place], [place] [address]      Provider Number: [id number]  Attending Physician Name and Address:  Burnadette Pop, MD  Relative Name and Phone Number:  Liam Rogers (daughter) Ph: [phone]    Current Level of Care: Hospital Recommended Level of Care: Skilled Nursing Facility Prior Approval Number:    Date Approved/Denied:   PASRR Number: [id number]  Discharge Plan: SNF    Current Diagnoses: Patient Active Problem List   Diagnosis Date Noted   Prediabetes 02/05/2021   BPH (benign prostatic hyperplasia) 02/05/2021   S/p left hip fracture 02/04/2021   Left lower quadrant abdominal pain    Seizure (HCC)    SBO (small bowel obstruction) (HCC) 07/26/2020   Essential hypertension 07/26/2020   Diabetic neuropathy (HCC) 04/30/2020   Pain due to onychomycosis of toenails of both feet 11/29/2018    Orientation RESPIRATION BLADDER Height & Weight     Self, Time, Situation, Place  Normal Continent Weight: 144 lb 6.4 oz (65.5 kg) Height:  5\' 11"  (180.3 cm)  BEHAVIORAL SYMPTOMS/MOOD NEUROLOGICAL BOWEL NUTRITION STATUS    Convulsions/Seizures Continent Diet (Soft diet)  AMBULATORY STATUS COMMUNICATION OF NEEDS Skin   Extensive Assist Verbally Surgical wounds, Skin abrasions (Abrasions: bilateral buttocks, sacrum)                       Personal Care Assistance Level of Assistance  Bathing, Feeding, Dressing Bathing Assistance: Maximum assistance Feeding assistance: Independent Dressing Assistance: Maximum assistance     Functional Limitations Info  Sight, Hearing, Speech Sight Info: Impaired Hearing Info: Adequate Speech Info: Adequate    SPECIAL CARE FACTORS FREQUENCY  PT (By  licensed PT), OT (By licensed OT)     PT Frequency: 5x's/week OT Frequency: 5x's/week            Contractures Contractures Info: Not present    Additional Factors Info  Code Status, Allergies, Insulin Sliding Scale Code Status Info: DNR Allergies Info: NKA   Insulin Sliding Scale Info: See discharge summary       Current Medications (02/07/2021):  This is the current hospital active medication list Current Facility-Administered Medications  Medication Dose Route Frequency Provider Last Rate Last Admin   acetaminophen (TYLENOL) tablet 325-650 mg  325-650 mg Oral Q6H PRN Gawne, Meghan M, PA-C       alum & mag hydroxide-simeth (MAALOX/MYLANTA) 200-200-20 MG/5ML suspension 30 mL  30 mL Oral Q4H PRN Gawne, Meghan M, PA-C       benazepril (LOTENSIN) tablet 10 mg  10 mg Oral Daily M M, PA-C   10 mg at 02/06/21 0809   bisacodyl (DULCOLAX) suppository 10 mg  10 mg Rectal Daily PRN 02/08/21 M, PA-C       docusate sodium (COLACE) capsule 100 mg  100 mg Oral BID Gawne, Meghan M, PA-C   100 mg at 02/06/21 2140   enoxaparin (LOVENOX) injection 40 mg  40 mg Subcutaneous Q24H Gawne, Meghan M, PA-C   40 mg at 02/06/21 2149   feeding supplement (ENSURE ENLIVE / ENSURE PLUS) liquid 237 mL  237 mL Oral BID BM 2150, MD   237 mL at 02/06/21 1238   finasteride (PROSCAR) tablet 5 mg  5 mg  Oral Daily Levester Fresh M, PA-C   5 mg at 02/06/21 0809   fluticasone (FLONASE) 50 MCG/ACT nasal spray 1-2 spray  1-2 spray Each Nare Daily PRN Levester Fresh M, PA-C       HYDROcodone-acetaminophen (NORCO) 7.5-325 MG per tablet 1-2 tablet  1-2 tablet Oral Q4H PRN Gawne, Meghan M, PA-C       HYDROcodone-acetaminophen (NORCO/VICODIN) 5-325 MG per tablet 1-2 tablet  1-2 tablet Oral Q4H PRN Jenne Pane, PA-C   1 tablet at 02/07/21 0351   insulin aspart (novoLOG) injection 0-15 Units  0-15 Units Subcutaneous TID WC Burnadette Pop, MD   5 Units at 02/06/21 1653   insulin aspart (novoLOG)  injection 0-5 Units  0-5 Units Subcutaneous QHS Adhikari, Amrit, MD       latanoprost (XALATAN) 0.005 % ophthalmic solution 1 drop  1 drop Both Eyes QHS Levester Fresh M, PA-C   1 drop at 02/06/21 2153   LORazepam (ATIVAN) tablet 2 mg  2 mg Oral QHS PRN Burnadette Pop, MD   2 mg at 02/06/21 2140   menthol-cetylpyridinium (CEPACOL) lozenge 3 mg  1 lozenge Oral PRN Levester Fresh M, PA-C       Or   phenol (CHLORASEPTIC) mouth spray 1 spray  1 spray Mouth/Throat PRN Gawne, Lindie Spruce M, PA-C       methocarbamol (ROBAXIN) tablet 500 mg  500 mg Oral Q6H PRN Levester Fresh M, PA-C   500 mg at 02/06/21 2141   Or   methocarbamol (ROBAXIN) 500 mg in dextrose 5 % 50 mL IVPB  500 mg Intravenous Q6H PRN Gawne, Meghan M, PA-C       metoCLOPramide (REGLAN) tablet 5-10 mg  5-10 mg Oral Q8H PRN Gawne, Meghan M, PA-C       Or   metoCLOPramide (REGLAN) injection 5-10 mg  5-10 mg Intravenous Q8H PRN Gawne, Meghan M, PA-C       metoprolol tartrate (LOPRESSOR) injection 5 mg  5 mg Intravenous Q6H PRN Gawne, Meghan M, PA-C       morphine 2 MG/ML injection 0.5-1 mg  0.5-1 mg Intravenous Q2H PRN Gawne, Meghan M, PA-C       ondansetron (ZOFRAN) tablet 4 mg  4 mg Oral Q6H PRN Gawne, Meghan M, PA-C       Or   ondansetron (ZOFRAN) injection 4 mg  4 mg Intravenous Q6H PRN Gawne, Meghan M, PA-C       pantoprazole (PROTONIX) EC tablet 40 mg  40 mg Oral Daily Levester Fresh M, PA-C   40 mg at 02/06/21 2725   polyethylene glycol (MIRALAX / GLYCOLAX) packet 17 g  17 g Oral Daily PRN Gawne, Meghan M, PA-C       psyllium (HYDROCIL/METAMUCIL) 1 packet  1 packet Oral QPM Jenne Pane, PA-C   1 packet at 02/06/21 1641   tamsulosin (FLOMAX) capsule 0.4 mg  0.4 mg Oral QPM Levester Fresh M, PA-C   0.4 mg at 02/06/21 1642   temazepam (RESTORIL) capsule 30 mg  30 mg Oral QHS Gawne, Meghan M, PA-C   30 mg at 02/06/21 2140   timolol (TIMOPTIC) 0.5 % ophthalmic solution 1 drop  1 drop Both Eyes QPM Levester Fresh M, PA-C   1 drop at 02/06/21  1642   vitamin B-12 (CYANOCOBALAMIN) tablet 100 mcg  100 mcg Oral Daily Levester Fresh M, PA-C   100 mcg at 02/06/21 3664     Discharge Medications: Please see discharge summary for a list of discharge medications.  Relevant Imaging Results:  Relevant Lab Results:   Additional Information SSN: [id number]  Ewing Schlein, LCSW

## 2021-02-08 LAB — CBC
HCT: 26.6 % — ABNORMAL LOW (ref 39.0–52.0)
Hemoglobin: 9 g/dL — ABNORMAL LOW (ref 13.0–17.0)
MCH: 32.1 pg (ref 26.0–34.0)
MCHC: 33.8 g/dL (ref 30.0–36.0)
MCV: 95 fL (ref 80.0–100.0)
Platelets: 151 10*3/uL (ref 150–400)
RBC: 2.8 MIL/uL — ABNORMAL LOW (ref 4.22–5.81)
RDW: 12.9 % (ref 11.5–15.5)
WBC: 6.9 10*3/uL (ref 4.0–10.5)
nRBC: 0 % (ref 0.0–0.2)

## 2021-02-08 LAB — URINALYSIS, COMPLETE (UACMP) WITH MICROSCOPIC
Bilirubin Urine: NEGATIVE
Glucose, UA: NEGATIVE mg/dL
Ketones, ur: NEGATIVE mg/dL
Leukocytes,Ua: NEGATIVE
Nitrite: NEGATIVE
Protein, ur: NEGATIVE mg/dL
Specific Gravity, Urine: 1.01 (ref 1.005–1.030)
pH: 7 (ref 5.0–8.0)

## 2021-02-08 LAB — GLUCOSE, CAPILLARY
Glucose-Capillary: 143 mg/dL — ABNORMAL HIGH (ref 70–99)
Glucose-Capillary: 217 mg/dL — ABNORMAL HIGH (ref 70–99)

## 2021-02-08 MED ORDER — POLYETHYLENE GLYCOL 3350 17 G PO PACK: 17.0000 g | PACK | Freq: Every day | ORAL | 0 refills | Status: AC | PRN

## 2021-02-08 NOTE — Progress Notes (Signed)
Patient seen and examined at the bedside this mrng.Comfortable,urine in foley bag is clear. No new change in the medical management.Hemodynamically stable for dc.

## 2021-02-08 NOTE — Progress Notes (Signed)
[facility] All questions were answered. Daughter, Geanie Berlin, was called instructed to make patient a follow up appointment with urologist as outpatient. PTAR transported patient to facility.

## 2021-02-08 NOTE — TOC Transition Note (Signed)
Transition of Care [facility]) - CM/SW Discharge Note  Patient Details  Name: Russell Ayers MRN: [id number] Date of Birth: 10-15-1932  [facility] CM/SW Contact:  Ewing Schlein, LCSW Phone Number: 02/08/2021, 10:36 AM  Clinical Narrative: Patient to discharge to [facility] SNF today. COVID test is negative. Discharge summary, discharge orders, and SNF transfer report faxed to facility in hub. Patient will go to room 36 and the number for report is [phone]. Medical necessity form done; PTAR scheduled. Discharge packet completed. CSW notified patient and RN of discharge. TOC signing off.  Final next level of care: Skilled Nursing Facility Barriers to Discharge: Barriers Resolved  Patient Goals and CMS Choice Patient states their goals for this hospitalization and ongoing recovery are:: Go to [facility] SNF for rehab CMS Medicare.gov Compare Post Acute Care list provided to:: Patient Choice offered to / list presented to : Patient  Discharge Placement Existing PASRR number confirmed : 02/07/21          Patient chooses bed at: [facility] Patient to be transferred to facility by: PTAR Patient and family notified of of transfer: 02/08/21  Discharge Plan and Services In-house Referral: Clinical Social Work Post Acute Care Choice: Skilled Nursing Facility          DME Arranged: N/A DME Agency: NA  Readmission Risk Interventions No flowsheet data found.

## 2021-02-08 NOTE — Plan of Care (Signed)
  Problem: Education: Goal: Knowledge of General Education information will improve Description: Including pain rating scale, medication(s)/side effects and non-pharmacologic comfort measures Outcome: Adequate for Discharge   Problem: Health Behavior/Discharge Planning: Goal: Ability to manage health-related needs will improve Outcome: Adequate for Discharge   Problem: Clinical Measurements: Goal: Ability to maintain clinical measurements within normal limits will improve Outcome: Adequate for Discharge Goal: Will remain free from infection Outcome: Adequate for Discharge Goal: Diagnostic test results will improve Outcome: Adequate for Discharge Goal: Respiratory complications will improve Outcome: Adequate for Discharge Goal: Cardiovascular complication will be avoided Outcome: Adequate for Discharge   Problem: Activity: Goal: Risk for activity intolerance will decrease Outcome: Adequate for Discharge   Problem: Nutrition: Goal: Adequate nutrition will be maintained Outcome: Adequate for Discharge   Problem: Coping: Goal: Level of anxiety will decrease Outcome: Adequate for Discharge   Problem: Elimination: Goal: Will not experience complications related to bowel motility Outcome: Adequate for Discharge Goal: Will not experience complications related to urinary retention Outcome: Adequate for Discharge   Problem: Pain Managment: Goal: General experience of comfort will improve Outcome: Adequate for Discharge   Problem: Safety: Goal: Ability to remain free from injury will improve Outcome: Adequate for Discharge   Problem: Skin Integrity: Goal: Risk for impaired skin integrity will decrease Outcome: Adequate for Discharge   Problem: Education: Goal: Knowledge of the prescribed therapeutic regimen will improve Outcome: Adequate for Discharge Goal: Understanding of discharge needs will improve Outcome: Adequate for Discharge Goal: Individualized Educational  Video(s) Outcome: Adequate for Discharge   Problem: Activity: Goal: Ability to avoid complications of mobility impairment will improve Outcome: Adequate for Discharge Goal: Ability to tolerate increased activity will improve Outcome: Adequate for Discharge   Problem: Clinical Measurements: Goal: Postoperative complications will be avoided or minimized Outcome: Adequate for Discharge   Problem: Pain Management: Goal: Pain level will decrease with appropriate interventions Outcome: Adequate for Discharge   Problem: Skin Integrity: Goal: Will show signs of wound healing Outcome: Adequate for Discharge   Problem: Education: Goal: Verbalization of understanding the information provided (i.e., activity precautions, restrictions, etc) will improve Outcome: Adequate for Discharge   Problem: Activity: Goal: Ability to ambulate and perform ADLs will improve Outcome: Adequate for Discharge   Problem: Clinical Measurements: Goal: Postoperative complications will be avoided or minimized Outcome: Adequate for Discharge   Problem: Self-Concept: Goal: Ability to maintain and perform role responsibilities to the fullest extent possible will improve Outcome: Adequate for Discharge   Problem: Pain Management: Goal: Pain level will decrease Outcome: Adequate for Discharge

## 2021-02-09 LAB — URINE CULTURE: Culture: NO GROWTH

## 2021-03-22 ENCOUNTER — Ambulatory Visit: Payer: Medicare Other | Admitting: Podiatry

## 2021-05-15 ENCOUNTER — Emergency Department (HOSPITAL_BASED_OUTPATIENT_CLINIC_OR_DEPARTMENT_OTHER)
Admission: EM | Admit: 2021-05-15 | Discharge: 2021-05-15 | Disposition: A | Discharge status: Home / Self Care | Payer: Medicare Other | Attending: Emergency Medicine | Admitting: Emergency Medicine

## 2021-05-15 ENCOUNTER — Encounter (HOSPITAL_BASED_OUTPATIENT_CLINIC_OR_DEPARTMENT_OTHER): Payer: Self-pay

## 2021-05-15 DIAGNOSIS — T83518A Infection and inflammatory reaction due to other urinary catheter, initial encounter: Secondary | ICD-10-CM | POA: Diagnosis present

## 2021-05-15 DIAGNOSIS — E1140 Type 2 diabetes mellitus with diabetic neuropathy, unspecified: Secondary | ICD-10-CM | POA: Insufficient documentation

## 2021-05-15 DIAGNOSIS — Z87891 Personal history of nicotine dependence: Secondary | ICD-10-CM | POA: Diagnosis not present

## 2021-05-15 DIAGNOSIS — Z79899 Other long term (current) drug therapy: Secondary | ICD-10-CM | POA: Diagnosis not present

## 2021-05-15 DIAGNOSIS — Y846 Urinary catheterization as the cause of abnormal reaction of the patient, or of later complication, without mention of misadventure at the time of the procedure: Secondary | ICD-10-CM | POA: Diagnosis not present

## 2021-05-15 DIAGNOSIS — I10 Essential (primary) hypertension: Secondary | ICD-10-CM | POA: Diagnosis not present

## 2021-05-15 DIAGNOSIS — T83511A Infection and inflammatory reaction due to indwelling urethral catheter, initial encounter: Secondary | ICD-10-CM

## 2021-05-15 LAB — URINALYSIS, ROUTINE W REFLEX MICROSCOPIC
Bilirubin Urine: NEGATIVE
Glucose, UA: NEGATIVE mg/dL
Ketones, ur: NEGATIVE mg/dL
Nitrite: NEGATIVE
Protein, ur: 30 mg/dL — AB
RBC / HPF: >50 RBC/hpf — ABNORMAL HIGH (ref 0–5)
Specific Gravity, Urine: 1.016 (ref 1.005–1.030)
WBC, UA: >50 WBC/hpf — ABNORMAL HIGH (ref 0–5)
pH: 7.5 (ref 5.0–8.0)

## 2021-05-15 MED ORDER — CEPHALEXIN 500 MG PO CAPS: 500.0000 mg | ORAL_CAPSULE | Freq: Four times a day (QID) | ORAL | 0 refills | Status: DC

## 2021-05-15 MED ORDER — CEPHALEXIN 500 MG PO CAPS: 500.0000 mg | ORAL_CAPSULE | Freq: Four times a day (QID) | ORAL | 0 refills | Status: AC

## 2021-05-15 NOTE — ED Provider Notes (Signed)
[place] EMERGENCY DEPT Provider Note   CSN: [id number] Arrival date & time: 05/15/21  [id number]     History Chief Complaint  Patient presents with   meatus drainage    Russell Ayers is a 87 y.o. male.  HPI  87 year old male with history significant for DM 2, BPH, SBO, HTN, Foley catheter in place who presents to the emergency department with purulence draining from around his Foley catheter.  The patient follows outpatient with urology and has an indwelling Foley placed for urinary retention in the setting of BPH.  He denies any fevers or chills.  He states that he has had increasing purulence draining from his urethral meatus around his Foley catheter.  He is still draining urine from his Foley catheter.  Past Medical History:  Diagnosis Date   Diabetes mellitus without complication ([place])    Generalized anxiety disorder    Glaucoma    Hypertension    Inguinal hernia    Insomnia    Prostatism    Stroke [facility])     Patient Active Problem List   Diagnosis Date Noted   Prediabetes 02/05/2021   BPH (benign prostatic hyperplasia) 02/05/2021   S/p left hip fracture 02/04/2021   Left lower quadrant abdominal pain    Seizure ([place])    SBO (small bowel obstruction) ([place]) 07/26/2020   Essential hypertension 07/26/2020   Diabetic neuropathy ([place]) 04/30/2020   Pain due to onychomycosis of toenails of both feet 11/29/2018    Past Surgical History:  Procedure Laterality Date   CATARACT EXTRACTION, BILATERAL     FEMUR IM NAIL Left 02/05/2021   Procedure: INTRAMEDULLARY (IM) NAIL FEMORAL;  Surgeon: Renette Butters, MD;  Location: [facility] ORS;  Service: Orthopedics;  Laterality: Left;   glaucoma         Family History  Problem Relation Age of Onset   Cancer Brother    Heart attack Brother     Social History   Tobacco Use   Smoking status: Former    Types: Cigarettes    Quit date: 02/18/1989    Years since quitting: 32.2   Smokeless tobacco: Never  Substance  Use Topics   Alcohol use: Never   Drug use: Never    Home Medications Prior to Admission medications   Medication Sig Start Date End Date Taking? Authorizing Provider  benazepril (LOTENSIN) 10 MG tablet Take 10 mg by mouth daily. 10/20/19   [provider]  cephALEXin (KEFLEX) 500 MG capsule Take 1 capsule (500 mg total) by mouth 4 (four) times daily for 7 days. 05/15/21 05/22/21  Regan Lemming, MD  diphenhydrAMINE (BENADRYL) 25 MG tablet Take 50 mg by mouth in the morning and at bedtime.    [provider]  finasteride (PROSCAR) 5 MG tablet Take 5 mg by mouth daily. 09/30/18   [provider]  fluticasone (FLONASE) 50 MCG/ACT nasal spray Place 1-2 sprays into both nostrils daily as needed for allergies.    [provider]  HYDROcodone-acetaminophen (NORCO) 7.5-325 MG tablet Take 1-2 tablets by mouth every 4 (four) hours as needed for severe pain (pain score 7-10). 02/07/21   Shelly Coss, MD  latanoprost (XALATAN) 0.005 % ophthalmic solution Place 1 drop into both eyes at bedtime. 11/20/18   [provider]  LORazepam (ATIVAN) 2 MG tablet Take 1 tablet (2 mg total) by mouth at bedtime as needed for sleep. 02/07/21   Shelly Coss, MD  Melatonin 10 MG TABS Take 40-60 mg by mouth at bedtime as  needed (sleep).    [provider]  Nutritional Supplements (PROSTATE PO) Take 1 capsule by mouth every evening.    [provider]  Omega-3 Fatty Acids (FISH OIL PO) Take 1 capsule by mouth once a week.    [provider]  polyethylene glycol (MIRALAX / GLYCOLAX) 17 g packet Take 17 g by mouth daily as needed for mild constipation. 02/08/21   Shelly Coss, MD  PSYLLIUM HUSK PO Take 1 tablet by mouth every evening.    [provider]  tamsulosin (FLOMAX) 0.4 MG CAPS capsule Take 0.4 mg by mouth every evening. 11/25/20   [provider]  temazepam (RESTORIL) 30 MG capsule Take 1 capsule (30 mg total) by mouth at bedtime  for 5 days. 02/07/21 02/12/21  Shelly Coss, MD  timolol (BETIMOL) 0.5 % ophthalmic solution Place 1 drop into both eyes every evening.    [provider]  vitamin B-12 (CYANOCOBALAMIN) 100 MCG tablet Take 100 mcg by mouth daily.    [provider]    Allergies    Patient has no known allergies.  Review of Systems   Review of Systems  Constitutional:  Negative for chills and fever.  HENT:  Negative for ear pain and sore throat.   Eyes:  Negative for pain and visual disturbance.  Respiratory:  Negative for cough and shortness of breath.   Cardiovascular:  Negative for chest pain and palpitations.  Gastrointestinal:  Negative for abdominal pain and vomiting.  Genitourinary:  Positive for penile discharge. Negative for dysuria and hematuria.  Musculoskeletal:  Negative for arthralgias and back pain.  Skin:  Negative for color change and rash.  Neurological:  Negative for seizures and syncope.  All other systems reviewed and are negative.  Physical Exam Updated Vital Signs BP 124/88 (BP Location: Right Arm)    Pulse 77    Temp 98.6 F (37 C) (Oral)    Resp 16    SpO2 95%   Physical Exam Vitals and nursing note reviewed.  Constitutional:      General: He is not in acute distress.    Appearance: He is well-developed.  HENT:     Head: Normocephalic and atraumatic.  Eyes:     Conjunctiva/sclera: Conjunctivae normal.     Pupils: Pupils are equal, round, and reactive to light.  Cardiovascular:     Rate and Rhythm: Normal rate and regular rhythm.     Heart sounds: No murmur heard. Pulmonary:     Effort: Pulmonary effort is normal. No respiratory distress.     Breath sounds: Normal breath sounds.  Abdominal:     General: There is no distension.     Palpations: Abdomen is soft.     Tenderness: There is no abdominal tenderness. There is no guarding.  Genitourinary:    Comments: Foley catheter in place, draining yellow urine, purulence noted to be draining around  the Foley catheter at the urethral meatus. Musculoskeletal:        General: No swelling, deformity or signs of injury.     Cervical back: Neck supple.  Skin:    General: Skin is warm and dry.     Capillary Refill: Capillary refill takes less than 2 seconds.     Findings: No lesion or rash.  Neurological:     General: No focal deficit present.     Mental Status: He is alert. Mental status is at baseline.  Psychiatric:        Mood and Affect: Mood normal.  ED Results / Procedures / Treatments   Labs (all labs ordered are listed, but only abnormal results are displayed) Labs Reviewed  URINALYSIS, ROUTINE W REFLEX MICROSCOPIC - Abnormal; Notable for the following components:      Result Value   APPearance HAZY (*)    Hgb urine dipstick MODERATE (*)    Protein, ur 30 (*)    Leukocytes,Ua LARGE (*)    RBC / HPF >50 (*)    WBC, UA >50 (*)    All other components within normal limits    EKG None  Radiology No results found.  Procedures Procedures   Medications Ordered in ED Medications - No data to display  ED Course  I have reviewed the triage vital signs and the nursing notes.  Pertinent labs & imaging results that were available during my care of the patient were reviewed by me and considered in my medical decision making (see chart for details).    MDM Rules/Calculators/A&P                          87 year old male with history significant for DM 2, BPH, SBO, HTN, Foley catheter in place who presents to the emergency department with purulence draining from around his Foley catheter.  The patient follows outpatient with urology and has an indwelling Foley placed for urinary retention in the setting of BPH.  He denies any fevers or chills.  He states that he has had increasing purulence draining from his urethral meatus around his Foley catheter.  He is still draining urine from his Foley catheter.  Concern for urethritis.  The patient's urine was tested and  concerning for large leukocytes, greater than 50 WBCs.  Given the purulence draining from around the patient's Foley catheter, his Foley catheter was exchanged by nursing in the emergency department.  We will treat symptomatically for UTI associated with the catheter with Keflex for 10 days.  Patient was advised to follow-up outpatient with urology.  Nonseptic, well-appearing, in no distress, comfortable following Foley catheter exchange.  Overall stable for discharge at this time.   Final Clinical Impression(s) / ED Diagnoses Final diagnoses:  Urinary tract infection associated with indwelling urethral catheter, initial encounter (HCC)    Rx / DC Orders ED Discharge Orders          Ordered    cephALEXin (KEFLEX) 500 MG capsule  4 times daily,   Status:  Discontinued        05/15/21 1111    cephALEXin (KEFLEX) 500 MG capsule  4 times daily        05/15/21 1140             Ernie Avena, MD 05/16/21 2300

## 2021-05-15 NOTE — Discharge Instructions (Addendum)
You were evaluated in the Emergency Department and after careful evaluation, we did not find any emergent condition requiring admission or further testing in the hospital.  Your exam/testing today was positive for catheter associated UTI. We have exchanged your Foley catheter. We will treat your infection with Keflex for 7 days.  Please return to the Emergency Department if you experience any worsening of your condition.  Thank you for allowing [place] to be a part of your care.

## 2021-08-02 ENCOUNTER — Emergency Department (HOSPITAL_COMMUNITY): Payer: Medicare Other

## 2021-08-02 ENCOUNTER — Other Ambulatory Visit: Payer: Self-pay

## 2021-08-02 ENCOUNTER — Encounter (HOSPITAL_COMMUNITY): Payer: Self-pay | Admitting: Emergency Medicine

## 2021-08-02 ENCOUNTER — Emergency Department (HOSPITAL_COMMUNITY)
Admission: EM | Admit: 2021-08-02 | Discharge: 2021-08-02 | Disposition: A | Discharge status: Home / Self Care | Payer: Medicare Other | Attending: Emergency Medicine | Admitting: Emergency Medicine

## 2021-08-02 DIAGNOSIS — S43015A Anterior dislocation of left humerus, initial encounter: Secondary | ICD-10-CM | POA: Insufficient documentation

## 2021-08-02 DIAGNOSIS — M25552 Pain in left hip: Secondary | ICD-10-CM | POA: Insufficient documentation

## 2021-08-02 DIAGNOSIS — E1140 Type 2 diabetes mellitus with diabetic neuropathy, unspecified: Secondary | ICD-10-CM | POA: Diagnosis not present

## 2021-08-02 DIAGNOSIS — I10 Essential (primary) hypertension: Secondary | ICD-10-CM | POA: Diagnosis not present

## 2021-08-02 DIAGNOSIS — Z96642 Presence of left artificial hip joint: Secondary | ICD-10-CM | POA: Insufficient documentation

## 2021-08-02 DIAGNOSIS — W1839XA Other fall on same level, initial encounter: Secondary | ICD-10-CM | POA: Insufficient documentation

## 2021-08-02 DIAGNOSIS — S7002XA Contusion of left hip, initial encounter: Secondary | ICD-10-CM

## 2021-08-02 DIAGNOSIS — S51012A Laceration without foreign body of left elbow, initial encounter: Secondary | ICD-10-CM | POA: Diagnosis not present

## 2021-08-02 DIAGNOSIS — S4992XA Unspecified injury of left shoulder and upper arm, initial encounter: Secondary | ICD-10-CM | POA: Diagnosis present

## 2021-08-02 DIAGNOSIS — W19XXXA Unspecified fall, initial encounter: Secondary | ICD-10-CM

## 2021-08-02 LAB — CBC WITH DIFFERENTIAL/PLATELET
Abs Immature Granulocytes: 0.06 10*3/uL (ref 0.00–0.07)
Basophils Absolute: 0 10*3/uL (ref 0.0–0.1)
Basophils Relative: 0 %
Eosinophils Absolute: 0.1 10*3/uL (ref 0.0–0.5)
Eosinophils Relative: 1 %
HCT: 30.3 % — ABNORMAL LOW (ref 39.0–52.0)
Hemoglobin: 10.3 g/dL — ABNORMAL LOW (ref 13.0–17.0)
Immature Granulocytes: 1 %
Lymphocytes Relative: 23 %
Lymphs Abs: 2.5 10*3/uL (ref 0.7–4.0)
MCH: 31.3 pg (ref 26.0–34.0)
MCHC: 34 g/dL (ref 30.0–36.0)
MCV: 92.1 fL (ref 80.0–100.0)
Monocytes Absolute: 0.5 10*3/uL (ref 0.1–1.0)
Monocytes Relative: 4 %
Neutro Abs: 8 10*3/uL — ABNORMAL HIGH (ref 1.7–7.7)
Neutrophils Relative %: 71 %
Platelets: 252 10*3/uL (ref 150–400)
RBC: 3.29 MIL/uL — ABNORMAL LOW (ref 4.22–5.81)
RDW: 13.8 % (ref 11.5–15.5)
WBC: 11.1 10*3/uL — ABNORMAL HIGH (ref 4.0–10.5)
nRBC: 0 % (ref 0.0–0.2)

## 2021-08-02 LAB — BASIC METABOLIC PANEL
Anion gap: 9 (ref 5–15)
BUN: 17 mg/dL (ref 8–23)
CO2: 26 mmol/L (ref 22–32)
Calcium: 9.1 mg/dL (ref 8.9–10.3)
Chloride: 98 mmol/L (ref 98–111)
Creatinine, Ser: 0.98 mg/dL (ref 0.61–1.24)
GFR, Estimated: >60 mL/min (ref >60)
Glucose, Bld: 170 mg/dL — ABNORMAL HIGH (ref 70–99)
Potassium: 4.4 mmol/L (ref 3.5–5.1)
Sodium: 133 mmol/L — ABNORMAL LOW (ref 135–145)

## 2021-08-02 MED ORDER — PROPOFOL 10 MG/ML IV BOLUS
INTRAVENOUS | Status: DC | PRN
  Administered 2021-08-02: 20 mg via INTRAVENOUS

## 2021-08-02 MED ORDER — MORPHINE SULFATE (PF) 2 MG/ML IV SOLN
2.0000 mg | Freq: Once | INTRAVENOUS | Status: AC
  Administered 2021-08-02: 2 mg via INTRAVENOUS
  Filled 2021-08-02: qty 1

## 2021-08-02 MED ORDER — PROPOFOL 10 MG/ML IV BOLUS
32.0000 mg | Freq: Once | INTRAVENOUS | Status: DC
  Filled 2021-08-02: qty 20

## 2021-08-02 MED ORDER — PROPOFOL 10 MG/ML IV BOLUS
INTRAVENOUS | Status: AC | PRN
Start: 2021-08-02 — End: 2021-08-02
  Administered 2021-08-02: 40 mg via INTRAVENOUS

## 2021-08-02 NOTE — ED Notes (Signed)
Called [facility] to transport [facility] to [facility] ?

## 2021-08-02 NOTE — Progress Notes (Signed)
Orthopedic Tech Progress Note ?Patient Details:  ?Russell Ayers ?[birth year] ?[id number] ? ?Ortho Devices ?Type of Ortho Device: Arm sling ?Ortho Device/Splint Location: lue ?Ortho Device/Splint Interventions: Ordered, Application, Adjustment ?  ?Post Interventions ?Patient Tolerated: Well ?Instructions Provided: Care of device, Adjustment of device ? ?Trinna Post ?08/02/2021, 3:59 AM ? ?

## 2021-08-02 NOTE — ED Provider Notes (Signed)
?[facility] ?Provider Note ? ? ?CSN: [id number] ?Arrival date & time: 08/02/21  0014 ? ?  ? ?History ? ?Chief Complaint  ?Patient presents with  ? Fall  ? ? ?Russell Ayers is a 88 y.o. male. ? ?Patient is an 88 year old male with past medical history of diabetes, diabetic neuropathy, hypertension, left hip fracture with hip replacement.  Patient presenting today with after a fall at his extended care facility.  Patient apparently fell, landing on his left side.  He is complaining of pain in the left hip and left shoulder.  He also has a laceration to the left elbow.  He denies having struck his head, headache, or neck pain.  He denies any chest or abdominal pain.  Patient transported here by EMS for evaluation of his injuries. ? ?The history is provided by the patient.  ?Fall ?This is a new problem. The current episode started less than 1 hour ago. The problem occurs constantly. The problem has not changed since onset.Exacerbated by: Movement and palpation. Nothing relieves the symptoms. He has tried nothing for the symptoms.  ? ?  ? ?Home Medications ?Prior to Admission medications   ?Medication Sig Start Date End Date Taking? Authorizing Provider  ?benazepril (LOTENSIN) 10 MG tablet Take 10 mg by mouth daily. 10/20/19   [provider]  ?diphenhydrAMINE (BENADRYL) 25 MG tablet Take 50 mg by mouth in the morning and at bedtime.    [provider]  ?finasteride (PROSCAR) 5 MG tablet Take 5 mg by mouth daily. 09/30/18   [provider]  ?fluticasone (FLONASE) 50 MCG/ACT nasal spray Place 1-2 sprays into both nostrils daily as needed for allergies.    [provider]  ?HYDROcodone-acetaminophen (NORCO) 7.5-325 MG tablet Take 1-2 tablets by mouth every 4 (four) hours as needed for severe pain (pain score 7-10). 02/07/21   Shelly Coss, MD  ?latanoprost (XALATAN) 0.005 % ophthalmic solution Place 1 drop into both eyes at bedtime. 11/20/18    [provider]  ?LORazepam (ATIVAN) 2 MG tablet Take 1 tablet (2 mg total) by mouth at bedtime as needed for sleep. 02/07/21   Shelly Coss, MD  ?Melatonin 10 MG TABS Take 40-60 mg by mouth at bedtime as needed (sleep).    [provider]  ?Nutritional Supplements (PROSTATE PO) Take 1 capsule by mouth every evening.    [provider]  ?Omega-3 Fatty Acids (FISH OIL PO) Take 1 capsule by mouth once a week.    [provider]  ?polyethylene glycol (MIRALAX / GLYCOLAX) 17 g packet Take 17 g by mouth daily as needed for mild constipation. 02/08/21   Shelly Coss, MD  ?PSYLLIUM HUSK PO Take 1 tablet by mouth every evening.    [provider]  ?tamsulosin (FLOMAX) 0.4 MG CAPS capsule Take 0.4 mg by mouth every evening. 11/25/20   [provider]  ?temazepam (RESTORIL) 30 MG capsule Take 1 capsule (30 mg total) by mouth at bedtime for 5 days. 02/07/21 02/12/21  Shelly Coss, MD  ?timolol (BETIMOL) 0.5 % ophthalmic solution Place 1 drop into both eyes every evening.    [provider]  ?vitamin B-12 (CYANOCOBALAMIN) 100 MCG tablet Take 100 mcg by mouth daily.    [provider]  ?   ? ?Allergies    ?Patient has no known allergies.   ? ?Review of Systems   ?Review of Systems  ?All other systems reviewed and are negative. ? ?Physical Exam ?Updated Vital Signs ?  BP (!) 148/73 (BP Location: Right Arm)   Pulse 73   Temp 97.7 ?F (36.5 ?C) (Oral)   Resp 14   SpO2 99%  ?Physical Exam ?Vitals and nursing note reviewed.  ?Constitutional:   ?   General: He is not in acute distress. ?   Appearance: He is well-developed. He is not diaphoretic.  ?HENT:  ?   Head: Normocephalic and atraumatic.  ?Cardiovascular:  ?   Rate and Rhythm: Normal rate and regular rhythm.  ?   Heart sounds: No murmur heard. ?  No friction rub.  ?Pulmonary:  ?   Effort: Pulmonary effort is normal. No respiratory distress.  ?   Breath sounds: Normal breath sounds. No wheezing or rales.   ?Abdominal:  ?   General: Bowel sounds are normal. There is no distension.  ?   Palpations: Abdomen is soft.  ?   Tenderness: There is no abdominal tenderness.  ?Musculoskeletal:     ?   General: Normal range of motion.  ?   Cervical back: Normal range of motion and neck supple.  ?   Comments: The left hip appears grossly normal with no obvious deformity, rotation, or shortening of the leg.  DP pulses are easily palpable and motor and sensation are intact throughout the entire foot. ? ?The left shoulder with deformity noted.  There is pain with range of motion.  Ulnar and radial pulses are easily palpable and motor and sensation are intact throughout the entire foot. ? ?There is a small, punctate laceration/skin tear of the left elbow that is less than 1/2 cm.  Bleeding is controlled.  ?Skin: ?   General: Skin is warm and dry.  ?Neurological:  ?   Mental Status: He is alert and oriented to person, place, and time.  ?   Coordination: Coordination normal.  ? ? ?ED Results / Procedures / Treatments   ?Labs ?(all labs ordered are listed, but only abnormal results are displayed) ?Labs Reviewed - No data to display ? ?EKG ?None ? ?Radiology ?No results found. ? ?Procedures ?.Sedation ? ?Date/Time: 08/02/2021 3:35 AM ?Performed by: Veryl Speak, MD ?Authorized by: Veryl Speak, MD  ? ?Consent:  ?  Consent obtained:  Verbal and written ?  Consent given by:  Patient ?  Risks discussed:  Allergic reaction, prolonged hypoxia resulting in organ damage, prolonged sedation necessitating reversal and inadequate sedation ?  Alternatives discussed:  Analgesia without sedation ?Universal protocol:  ?  Procedure explained and questions answered to patient or proxy's satisfaction: yes   ?  Relevant documents present and verified: yes   ?  Test results available: yes   ?  Imaging studies available: yes   ?  Required blood products, implants, devices, and special equipment available: yes   ?  Immediately prior to procedure, a time out  was called: yes   ?  Patient identity confirmed:  Arm band, verbally with patient and hospital-assigned identification number ?Indications:  ?  Procedure performed:  Dislocation reduction ?Pre-sedation assessment:  ?  Time since last food or drink:  6 hours ?  ASA classification: class 3 - patient with severe systemic disease   ?  Mallampati score:  I - soft palate, uvula, fauces, pillars visible ?  Neck mobility: normal   ?  Pre-sedation assessments completed and reviewed: airway patency and cardiovascular function   ?  Pre-sedation assessment completed:  08/02/2021 2:45 AM ?Immediate pre-procedure details:  ?  Reassessment: Patient reassessed immediately prior to procedure   ?  Reviewed: vital signs, relevant labs/tests and NPO status   ?  Verified: bag valve mask available, intubation equipment available, IV patency confirmed and oxygen available   ?Procedure details (see MAR for exact dosages):  ?  Preoxygenation:  Nasal cannula ?  Sedation:  Propofol ?  Intended level of sedation: deep and moderate (conscious sedation) ?  Intra-procedure monitoring:  Blood pressure monitoring ?  Intra-procedure events: none   ?  Total Provider sedation time (minutes):  15 ?Post-procedure details:  ?  Attendance: Constant attendance by certified staff until patient recovered   ?  Recovery: Patient returned to pre-procedure baseline   ?  Post-sedation assessments completed and reviewed: airway patency, cardiovascular function, mental status and nausea/vomiting   ?  Patient is stable for discharge or admission: yes   ?  Procedure completion:  Tolerated well, no immediate complications ?Reduction of dislocation ? ?Date/Time: 08/02/2021 3:38 AM ?Performed by: Veryl Speak, MD ?Authorized by: Veryl Speak, MD  ?Consent: Verbal consent obtained. Written consent obtained. ?Risks and benefits: risks, benefits and alternatives were discussed ?Consent given by: patient ?Patient understanding: patient states understanding of the procedure  being performed ?Patient consent: the patient's understanding of the procedure matches consent given ?Procedure consent: procedure consent matches procedure scheduled ?Relevant documents: relevant documents pres

## 2021-08-02 NOTE — ED Notes (Signed)
Patient verbalizes understanding of d/c instructions. Opportunities for questions and answers were provided. Pt d/c from ED and transferred back to [facility] via [facility]. ?

## 2021-08-02 NOTE — ED Triage Notes (Signed)
Patient arrived via [facility] to be evaluated for fall. Per EMS patient slept and fell to the left side. Laceration to left elbow, complaints of left hip and left shoulder pain. No blood thinner reported. Per EMS BP-148/78, HR-65, SpO2-95 on room air, CBG-164. ?

## 2021-08-02 NOTE — Discharge Instructions (Signed)
Wear arm sling for the next several days, then slowly begin to reintroduce activity. ? ?Ice your shoulder for 20 minutes every 2 hours while awake for the next 2 days. ? ?Continue other medications as previously prescribed. ?

## 2021-10-04 ENCOUNTER — Other Ambulatory Visit: Payer: Self-pay

## 2021-10-04 ENCOUNTER — Emergency Department (HOSPITAL_COMMUNITY)
Admission: EM | Admit: 2021-10-04 | Discharge: 2021-10-04 | Disposition: A | Discharge status: Home / Self Care | Payer: Medicare Other | Attending: Emergency Medicine | Admitting: Emergency Medicine

## 2021-10-04 ENCOUNTER — Encounter (HOSPITAL_COMMUNITY): Payer: Self-pay

## 2021-10-04 DIAGNOSIS — R45851 Suicidal ideations: Secondary | ICD-10-CM | POA: Insufficient documentation

## 2021-10-04 DIAGNOSIS — Z046 Encounter for general psychiatric examination, requested by authority: Secondary | ICD-10-CM | POA: Diagnosis present

## 2021-10-04 DIAGNOSIS — E119 Type 2 diabetes mellitus without complications: Secondary | ICD-10-CM | POA: Diagnosis not present

## 2021-10-04 DIAGNOSIS — Z20822 Contact with and (suspected) exposure to covid-19: Secondary | ICD-10-CM | POA: Insufficient documentation

## 2021-10-04 DIAGNOSIS — F418 Other specified anxiety disorders: Secondary | ICD-10-CM

## 2021-10-04 DIAGNOSIS — Z008 Encounter for other general examination: Secondary | ICD-10-CM

## 2021-10-04 DIAGNOSIS — Z7984 Long term (current) use of oral hypoglycemic drugs: Secondary | ICD-10-CM | POA: Diagnosis not present

## 2021-10-04 LAB — COMPREHENSIVE METABOLIC PANEL
ALT: 12 U/L (ref 0–44)
AST: 17 U/L (ref 15–41)
Albumin: 3.8 g/dL (ref 3.5–5.0)
Alkaline Phosphatase: 69 U/L (ref 38–126)
Anion gap: 7 (ref 5–15)
BUN: 14 mg/dL (ref 8–23)
CO2: 24 mmol/L (ref 22–32)
Calcium: 9.4 mg/dL (ref 8.9–10.3)
Chloride: 100 mmol/L (ref 98–111)
Creatinine, Ser: 0.85 mg/dL (ref 0.61–1.24)
GFR, Estimated: >60 mL/min (ref >60)
Glucose, Bld: 135 mg/dL — ABNORMAL HIGH (ref 70–99)
Potassium: 4 mmol/L (ref 3.5–5.1)
Sodium: 131 mmol/L — ABNORMAL LOW (ref 135–145)
Total Bilirubin: 0.5 mg/dL (ref 0.3–1.2)
Total Protein: 7.2 g/dL (ref 6.5–8.1)

## 2021-10-04 LAB — RAPID URINE DRUG SCREEN, HOSP PERFORMED
Amphetamines: NOT DETECTED
Barbiturates: NOT DETECTED
Benzodiazepines: POSITIVE — AB
Cocaine: NOT DETECTED
Opiates: NOT DETECTED
Tetrahydrocannabinol: NOT DETECTED

## 2021-10-04 LAB — CBC
HCT: 33.2 % — ABNORMAL LOW (ref 39.0–52.0)
Hemoglobin: 11.3 g/dL — ABNORMAL LOW (ref 13.0–17.0)
MCH: 31.8 pg (ref 26.0–34.0)
MCHC: 34 g/dL (ref 30.0–36.0)
MCV: 93.5 fL (ref 80.0–100.0)
Platelets: 249 10*3/uL (ref 150–400)
RBC: 3.55 MIL/uL — ABNORMAL LOW (ref 4.22–5.81)
RDW: 15 % (ref 11.5–15.5)
WBC: 8.2 10*3/uL (ref 4.0–10.5)
nRBC: 0 % (ref 0.0–0.2)

## 2021-10-04 LAB — ACETAMINOPHEN LEVEL: Acetaminophen (Tylenol), Serum: 13 ug/mL (ref 10–30)

## 2021-10-04 LAB — SALICYLATE LEVEL: Salicylate Lvl: <7 mg/dL — ABNORMAL LOW (ref 7.0–30.0)

## 2021-10-04 LAB — RESP PANEL BY RT-PCR (FLU A&B, COVID) ARPGX2
Influenza A by PCR: NEGATIVE
Influenza B by PCR: NEGATIVE
SARS Coronavirus 2 by RT PCR: NEGATIVE

## 2021-10-04 LAB — ETHANOL: Alcohol, Ethyl (B): <10 mg/dL (ref <10)

## 2021-10-04 NOTE — ED Notes (Signed)
Patient DC d off unit to facility per provider. Patient alert, cooperative, no s/s of distress. DC information and belongings given to daughter for facility. Patient transported off unit in w/c, escorted by NT. Patient transported by daughter.  ?

## 2021-10-04 NOTE — ED Triage Notes (Addendum)
Patient states he is a resident of [address] in [place], [place]. ? ?The facility called the charge nurse and reported that the patient was trying to strangle himself with a gait belt last night. ?Patient arrives with a foley. Patient's daughter also reported that th epatient has said a few times tht he wished he still had his gun." ? ?Patient states, "I Russell Ayers' t trying to do that. I was just playing around." ? ?Patient denies any SI/HI. ?

## 2021-10-04 NOTE — ED Provider Notes (Signed)
?[facility]-EMERGENCY DEPT ?Provider Note ? ? ?CSN: [id number] ?Arrival date & time: 10/04/21  1130 ? ?  ? ?History ? ?Chief Complaint  ?Patient presents with  ? Suicidal  ? Psychiatric Evaluation  ? ? ?Russell Ayers is a 88 y.o. male with medical history to include diabetes, generalized anxiety disorder, glaucoma, hypertension, insomnia, prostatism.  Patient reports ED for evaluation.  Patient states he currently resides at [facility] in [place], [place].  Per facility, they called the charge nurse reporting that the patient was trying to strangle himself with a gait belt last night.  Patient arrives with daughter at bedside, patient daughter reported that the patient is said a few times that he wished he "still had his gun".  On questioning, the patient states that he "was not trying to do that.  I was just playing around."  in reference to him tying a gait belt around his neck.  Patient denies any SI/HI, AVH.  Patient denies any psychiatric history.  Patient denies any inpatient psychiatric stays.  Patient denies history of self-harm.  The patient states that he was try to get the nurses attention last night when he took the gait belt and strangled himself.  Patient reports that the nurse was supposed to be helping him however was helping someone else instead.  Patient denies any chest pain, shortness of breath, headache, fever, nausea, vomiting, diarrhea, abdominal pain, lightheadedness, dizziness, weakness. ? ?HPI ? ?  ? ?Home Medications ?Prior to Admission medications   ?Medication Sig Start Date End Date Taking? Authorizing Provider  ?acetaminophen (TYLENOL) 500 MG tablet Take 1,000 mg by mouth 3 (three) times daily.   Yes [provider]  ?Cholecalciferol (VITAMIN D3) 50 MCG (2000 UT) TABS Take 2,000 Units by mouth every morning.   Yes [provider]  ?diclofenac Sodium (VOLTAREN) 1 % GEL Apply 2 g topically See admin instructions. Apply 2 grams topically twice  daily to right foot toes, left hip and both knees.   Yes [provider]  ?escitalopram (LEXAPRO) 20 MG tablet Take 20 mg by mouth every morning. 09/26/21  Yes [provider]  ?ferrous sulfate 325 (65 FE) MG tablet Take 325 mg by mouth every evening.   Yes [provider]  ?finasteride (PROSCAR) 5 MG tablet Take 5 mg by mouth every morning. 09/30/18  Yes [provider]  ?hydrocortisone 1 % lotion Apply 1 application. topically See admin instructions. Apply topically - liberally -  twice daily to blisters between buttocks (left buttock). Notify MD if worsens or no resolution   Yes [provider]  ?latanoprost (XALATAN) 0.005 % ophthalmic solution Place 1 drop into both eyes every evening. 11/20/18  Yes [provider]  ?LORazepam (ATIVAN) 1 MG tablet Take 1 mg by mouth 2 (two) times daily. 09/24/21  Yes [provider]  ?metFORMIN (GLUCOPHAGE) 500 MG tablet Take 500 mg by mouth 2 (two) times daily. 09/28/21  Yes [provider]  ?mirabegron ER (MYRBETRIQ) 25 MG TB24 tablet Take 25 mg by mouth in the morning.   Yes [provider]  ?Misc Natural Products (PROSTATE THERAPY COMPLEX) CAPS Take 1 capsule by mouth every evening. Nettle-pumpkin-saw palm-min 17)   Yes [provider]  ?polyethylene glycol (MIRALAX / GLYCOLAX) 17 g packet Take 17 g by mouth daily as needed for mild constipation. 02/08/21  Yes Burnadette Pop, MD  ?tamsulosin (FLOMAX) 0.4 MG CAPS capsule Take 0.4 mg by mouth every evening. 11/25/20  Yes [provider]  ?temazepam (RESTORIL) 15 MG capsule Take 15 mg by mouth at bedtime. 09/19/21  Yes [provider]  ?timolol (TIMOPTIC) 0.5 % ophthalmic solution Place 1 drop into both eyes every evening. 08/27/21  Yes [provider]  ?traZODone (DESYREL) 100 MG tablet Take 100 mg by mouth at bedtime.   Yes [provider]  ?vitamin B-12 (CYANOCOBALAMIN) 100 MCG tablet Take 100 mcg by mouth every  morning.   Yes [provider]  ?zinc oxide 20 % ointment Apply 1 application. topically See admin instructions. Apply topically to buttocks every shift   Yes [provider]  ?   ? ?Allergies    ?Patient has no known allergies.   ? ?Review of Systems   ?Review of Systems  ?Constitutional:  Negative for chills and fever.  ?Respiratory:  Negative for shortness of breath.   ?Cardiovascular:  Negative for chest pain.  ?Gastrointestinal:  Negative for abdominal pain, diarrhea, nausea and vomiting.  ?Neurological:  Negative for dizziness, weakness, light-headedness and headaches.  ?All other systems reviewed and are negative. ? ?Physical Exam ?Updated Vital Signs ?BP (!) 152/68 (BP Location: Right Arm)   Pulse 83   Temp 98.1 ?F (36.7 ?C) (Oral)   Resp 16   Ht 5\' 11"  (1.803 m)   Wt 69.9 kg   SpO2 98%   BMI 21.48 kg/m?  ?Physical Exam ?Vitals and nursing note reviewed.  ?Constitutional:   ?   General: He is not in acute distress. ?   Appearance: Normal appearance. He is not ill-appearing, toxic-appearing or diaphoretic.  ?HENT:  ?   Head: Normocephalic and atraumatic.  ?   Nose: Nose normal. No congestion.  ?   Mouth/Throat:  ?   Mouth: Mucous membranes are dry.  ?   Pharynx: Oropharynx is clear.  ?Eyes:  ?   Extraocular Movements: Extraocular movements intact.  ?   Conjunctiva/sclera: Conjunctivae normal.  ?   Pupils: Pupils are equal, round, and reactive to light.  ?Cardiovascular:  ?   Rate and Rhythm: Normal rate and regular rhythm.  ?Pulmonary:  ?   Effort: Pulmonary effort is normal.  ?   Breath sounds: Normal breath sounds. No wheezing.  ?Abdominal:  ?   General: Abdomen is flat. Bowel sounds are normal.  ?   Palpations: Abdomen is soft.  ?   Tenderness: There is no abdominal tenderness.  ?Genitourinary: ?   Comments: Foley catheter in place ?Musculoskeletal:     ?   General: Normal range of motion.  ?   Cervical back: Normal range of motion and neck supple. No tenderness.  ?Skin: ?    General: Skin is warm and dry.  ?   Capillary Refill: Capillary refill takes less than 2 seconds.  ?Neurological:  ?   General: No focal deficit present.  ?   Mental Status: He is alert and oriented to person, place, and time.  ?   GCS: GCS eye subscore is 4. GCS verbal subscore is 5. GCS motor subscore is 6.  ?   Cranial Nerves: Cranial nerves 2-12 are intact. No cranial nerve deficit.  ?   Sensory: Sensation is intact. No sensory deficit.  ?   Motor: Motor function is intact. No weakness.  ?   Coordination: Coordination is intact. Heel to [facility] Test normal.  ?Psychiatric:     ?   Mood and Affect: Mood normal.     ?   Behavior: Behavior normal.     ?  Thought Content: Thought content normal.  ? ? ?ED Results / Procedures / Treatments   ?Labs ?(all labs ordered are listed, but only abnormal results are displayed) ?Labs Reviewed  ?COMPREHENSIVE METABOLIC PANEL - Abnormal; Notable for the following components:  ?    Result Value  ? Sodium 131 (*)   ? Glucose, Bld 135 (*)   ? All other components within normal limits  ?SALICYLATE LEVEL - Abnormal; Notable for the following components:  ? Salicylate Lvl <7.0 (*)   ? All other components within normal limits  ?CBC - Abnormal; Notable for the following components:  ? RBC 3.55 (*)   ? Hemoglobin 11.3 (*)   ? HCT 33.2 (*)   ? All other components within normal limits  ?RAPID URINE DRUG SCREEN, HOSP PERFORMED - Abnormal; Notable for the following components:  ? Benzodiazepines POSITIVE (*)   ? All other components within normal limits  ?RESP PANEL BY RT-PCR (FLU A&B, COVID) ARPGX2  ?ETHANOL  ?ACETAMINOPHEN LEVEL  ? ? ?EKG ?None ? ?Radiology ?No results found. ? ?Procedures ?Procedures  ? ? ?Medications Ordered in ED ?Medications - No data to display ? ?ED Course/ Medical Decision Making/ A&P ?  ?                        ?Medical Decision Making ?Amount and/or Complexity of Data Reviewed ?Labs: ordered. ? ? ?89-year male presents ED for evaluation.  Please see HPI for further  details. ? ?On examination, patient is afebrile, nontachycardic.  Patient abdomen soft compressible 4 quadrants.  Patient lungs clear to auscultation, not hypoxic on room air.  Patient denies any medical complaints.

## 2021-10-04 NOTE — ED Notes (Addendum)
Please call daughter for any updates- [phone] ? ? ?Daughter left pts paper work and DNR form at bedside in envelope with belongings bag.  ? ?Pt also has his own walker at bedside with him. Pt sticker attached.  ?

## 2021-10-04 NOTE — Consult Note (Addendum)
[facility] ED Discharge ? ?10/04/2021 6:03 PM ?Russell Ayers  ?MRN:  [id number] ? ?Method of visit?: Face to Face  ? ?Principal Problem: <principal problem not specified> ?Discharge Diagnoses: Active Problems: ?  * No active hospital problems. * ? ? ?Subjective: 88 Years old Caucasian male brought to the ER by his daughter from ALF where he resides for evaluation.  Patient lives at [facility] in [place].  Staff called the Charge Nurse to report that patient was trying to strangle himself with a gait belt last night.  This morning daughter brought patient to the ER for evaluation.  Provider spent time with patient in his room for evaluation.  Patient spoke coherently, alert and oriented x3-4.  He admitted that he had a belt and that he did not mean to strangle himself with the belt.  He stated that he was playing with the belt.  He adamantly denied wanting to kill himself.  He also admitted that he feels depressed/sad but not to the extent of wanting to kill himself.  He denied previous attempt to kill himself.  Patient stated " I love Steci, I care about her"  Harlin Rain is her only living child after he lost his only son. ?Provider called Serafina Royals, patient's daughter who reported that she brought the father to the ER.  She also added that she does not believe her father wanted to kill himself.  She is worried about the amount of Medications her father is being given at the facility.  Patient is on Restoril 15 mg at night for sleep, Ativan 1 mg twice a day for anxiety and Trazodone 100 mg for sleep as well.  MS Staci wanted to know if her father's medications can be changed by the facility provider so that adequate monitoring will be done.  She agreed to this plan.  Patient takes Lexapro 20 mg daily for depression and anxiety.  We also discussed removing items that poses a danger to patient from his room since he lives alone in that room.  Patient adamantly denied suicide thought or ideation.  He denied AVH  and no mention of paranoia.  Patient is discharged.  Daughter was made aware that close monitoring of patient is required.at the facility. ? ?Total Time spent with patient: 30 minutes ? ?Past Psychiatric History: Depression, anxiety  facility Psychiatrist manages patient ? ?Past Medical History:  ?Past Medical History:  ?Diagnosis Date  ? Diabetes mellitus without complication ([place])   ? Generalized anxiety disorder   ? Glaucoma   ? Hypertension   ? Inguinal hernia   ? Insomnia   ? Prostatism   ? Stroke [facility])   ?  ?Past Surgical History:  ?Procedure Laterality Date  ? CATARACT EXTRACTION, BILATERAL    ? FEMUR IM NAIL Left 02/05/2021  ? Procedure: INTRAMEDULLARY (IM) NAIL FEMORAL;  Surgeon: Renette Butters, MD;  Location: [facility] ORS;  Service: Orthopedics;  Laterality: Left;  ? glaucoma    ? ?Family History:  ?Family History  ?Problem Relation Age of Onset  ? Cancer Brother   ? Heart attack Brother   ? ?Family Psychiatric  History: unknown ?Social History:  ?Social History  ? ?Substance and Sexual Activity  ?Alcohol Use Never  ?   ?Social History  ? ?Substance and Sexual Activity  ?Drug Use Never  ?  ?Social History  ? ?Socioeconomic History  ? Marital status: Divorced  ?  Spouse name: Not on file  ? Number of children: 1  ?  Years of education: Not on file  ? Highest education level: Not on file  ?Occupational History  ? Occupation: retired Engineer, technical sales  ?Tobacco Use  ? Smoking status: Former  ?  Types: Cigarettes  ?  Quit date: 02/18/1989  ?  Years since quitting: 32.6  ? Smokeless tobacco: Never  ?Vaping Use  ? Vaping Use: Never used  ?Substance and Sexual Activity  ? Alcohol use: Never  ? Drug use: Never  ? Sexual activity: Not on file  ?Other Topics Concern  ? Not on file  ?Social History Narrative  ? Not on file  ? ?Social Determinants of Health  ? ?Financial Resource Strain: Not on file  ?Food Insecurity: Not on file  ?Transportation Needs: Not on file  ?Physical Activity: Not on file  ?Stress: Not on file   ?Social Connections: Not on file  ? ? ?Tobacco Cessation:  N/A, patient does not currently use tobacco products ? ?Current Medications: ?No current facility-administered medications for this encounter.  ? ?Current Outpatient Medications  ?Medication Sig Dispense Refill  ? acetaminophen (TYLENOL) 500 MG tablet Take 1,000 mg by mouth 3 (three) times daily.    ? Cholecalciferol (VITAMIN D3) 50 MCG (2000 UT) TABS Take 2,000 Units by mouth every morning.    ? diclofenac Sodium (VOLTAREN) 1 % GEL Apply 2 g topically See admin instructions. Apply 2 grams topically twice daily to right foot toes, left hip and both knees.    ? escitalopram (LEXAPRO) 20 MG tablet Take 20 mg by mouth every morning.    ? ferrous sulfate 325 (65 FE) MG tablet Take 325 mg by mouth every evening.    ? finasteride (PROSCAR) 5 MG tablet Take 5 mg by mouth every morning.    ? hydrocortisone 1 % lotion Apply 1 application. topically See admin instructions. Apply topically - liberally -  twice daily to blisters between buttocks (left buttock). Notify MD if worsens or no resolution    ? latanoprost (XALATAN) 0.005 % ophthalmic solution Place 1 drop into both eyes every evening.    ? LORazepam (ATIVAN) 1 MG tablet Take 1 mg by mouth 2 (two) times daily.    ? metFORMIN (GLUCOPHAGE) 500 MG tablet Take 500 mg by mouth 2 (two) times daily.    ? mirabegron ER (MYRBETRIQ) 25 MG TB24 tablet Take 25 mg by mouth in the morning.    ? Misc Natural Products (PROSTATE THERAPY COMPLEX) CAPS Take 1 capsule by mouth every evening. Nettle-pumpkin-saw palm-min 17)    ? polyethylene glycol (MIRALAX / GLYCOLAX) 17 g packet Take 17 g by mouth daily as needed for mild constipation. 14 each 0  ? tamsulosin (FLOMAX) 0.4 MG CAPS capsule Take 0.4 mg by mouth every evening.    ? temazepam (RESTORIL) 15 MG capsule Take 15 mg by mouth at bedtime.    ? timolol (TIMOPTIC) 0.5 % ophthalmic solution Place 1 drop into both eyes every evening.    ? traZODone (DESYREL) 100 MG tablet Take  100 mg by mouth at bedtime.    ? vitamin B-12 (CYANOCOBALAMIN) 100 MCG tablet Take 100 mcg by mouth every morning.    ? zinc oxide 20 % ointment Apply 1 application. topically See admin instructions. Apply topically to buttocks every shift    ? ?PTA Medications: ?(Not in a hospital admission) ? ? ?Musculoskeletal: ?Strength & Muscle Tone:  SITTING IN BED RESTING ?Gait & Station:  Sitting in bed resting ?Patient leans:  see above ? ?Psychiatric Specialty Exam: ? ?  Presentation  ?General Appearance: Appropriate for Environment; Well Groomed ?Eye Contact:Good ?Speech:Clear and Coherent; Normal Rate ?Speech Volume:Normal ?Handedness:Right ? ?Mood and Affect  ?Mood:Depressed ?Affect:Congruent ? ?Thought Process  ?Thought Processes:Coherent; Goal Directed ?Descriptions of Associations:Intact ? ?Orientation:Full (Time, Place and Person) ? ?Thought Content:Logical ? ?History of Schizophrenia/Schizoaffective disorder:No data recorded ?Duration of Psychotic Symptoms:No data recorded ?Hallucinations:Hallucinations: None ? ?Ideas of Reference:None ? ?Suicidal Thoughts:Suicidal Thoughts: No ? ?Homicidal Thoughts:Homicidal Thoughts: No ? ? ?Sensorium  ?Memory:Immediate Good; Recent Good; Remote Fair ?Judgment:Fair ?Insight:Good ? ?Executive Functions  ?Concentration:Fair ?Attention Span:Good ?Recall:Good ?Fund of [place] ?Language:Good ? ?Psychomotor Activity  ?Psychomotor Activity:Psychomotor Activity: Normal ? ?Assets  ?Assets:Communication Skills; Desire for Improvement; Financial Resources/Insurance; Housing; Social Support ? ?Sleep  ?Sleep:Sleep: Fair ? ? ?Physical Exam: ?Physical Exam ?Vitals and nursing note reviewed.  ?Constitutional:   ?   Appearance: Normal appearance.  ?HENT:  ?   Head: Atraumatic.  ?   Nose: Nose normal.  ?Cardiovascular:  ?   Rate and Rhythm: Normal rate and regular rhythm.  ?Pulmonary:  ?   Effort: Pulmonary effort is normal.  ?Musculoskeletal:     ?   General: Normal range of motion.  ?    Cervical back: Normal range of motion.  ?Skin: ?   General: Skin is warm and dry.  ?Neurological:  ?   General: No focal deficit present.  ?   Mental Status: He is alert and oriented to person, place, and time

## 2021-11-16 ENCOUNTER — Other Ambulatory Visit: Payer: Self-pay | Admitting: Adult Health

## 2021-11-16 ENCOUNTER — Other Ambulatory Visit (HOSPITAL_COMMUNITY): Payer: Self-pay | Admitting: Internal Medicine

## 2021-11-16 ENCOUNTER — Other Ambulatory Visit (HOSPITAL_COMMUNITY): Payer: Self-pay | Admitting: Adult Health

## 2021-11-16 ENCOUNTER — Other Ambulatory Visit: Payer: Self-pay | Admitting: Internal Medicine

## 2021-11-16 DIAGNOSIS — K469 Unspecified abdominal hernia without obstruction or gangrene: Secondary | ICD-10-CM

## 2021-11-16 DIAGNOSIS — K4090 Unilateral inguinal hernia, without obstruction or gangrene, not specified as recurrent: Secondary | ICD-10-CM

## 2021-11-25 ENCOUNTER — Ambulatory Visit (HOSPITAL_BASED_OUTPATIENT_CLINIC_OR_DEPARTMENT_OTHER)
Admission: RE | Admit: 2021-11-25 | Discharge: 2021-11-25 | Disposition: A | Discharge status: Home / Self Care | Payer: Medicare Other | Source: Ambulatory Visit | Attending: Internal Medicine | Admitting: Internal Medicine

## 2021-11-25 ENCOUNTER — Encounter (HOSPITAL_BASED_OUTPATIENT_CLINIC_OR_DEPARTMENT_OTHER): Payer: Self-pay

## 2021-11-25 DIAGNOSIS — K4090 Unilateral inguinal hernia, without obstruction or gangrene, not specified as recurrent: Secondary | ICD-10-CM | POA: Diagnosis present

## 2021-11-25 DIAGNOSIS — K469 Unspecified abdominal hernia without obstruction or gangrene: Secondary | ICD-10-CM | POA: Diagnosis not present

## 2021-11-25 LAB — POCT I-STAT CREATININE: Creatinine, Ser: 0.9 mg/dL (ref 0.61–1.24)

## 2021-11-25 MED ORDER — IOHEXOL 300 MG/ML  SOLN
100.0000 mL | Freq: Once | INTRAMUSCULAR | Status: AC | PRN
  Administered 2021-11-25: 80 mL via INTRAVENOUS

## 2021-12-22 ENCOUNTER — Ambulatory Visit: Payer: Self-pay | Admitting: Surgery

## 2021-12-22 NOTE — H&P (Signed)
Russell Ayers [id number]   Referring Provider: Self  Subjective   Chief Complaint: Hernia   History of Present Illness:  88 year old man with history of hypertension, diabetes, depression/anxiety, bipolar disorder, glaucoma, insomnia, prostatism with indwelling Foley, left hip fracture status post ORIF, chronic pain/osteoarthritis, stroke, anemia, inguinal hernia. He is a resident at [facility].  He underwent a CT scan a couple weeks ago confirming a large left inguinal hernia containing mesenteric fat and sigmoid colon without obstruction or incarceration. Also noted bronchiectasis and mucous plugging, moderate hiatal hernia, thick-walled bladder with indwelling Foley. He was evaluated here a few years ago for the hernia and at that time elected to manage with a hernia belt and expectantly. Recently he has had more discomfort related to the hernia. He has not been wearing his hernia belt due to the indwelling Foley. Notes some constipation as well.  Review of Systems: A complete review of systems was obtained from the patient. I have reviewed this information and discussed as appropriate with the patient. See HPI as well for other ROS.  Medical History: Past Medical History:  Diagnosis Date  Anxiety  Asthma, unspecified asthma severity, unspecified whether complicated, unspecified whether persistent  Diabetes mellitus without complication (CMS-HCC)  Glaucoma (increased eye pressure)  Hypertension   There is no problem list on file for this patient.  Past Surgical History:  Procedure Laterality Date  cataract surgery  STAB PHLEBECTOMY VARICOSE VEINS ONE EXTREMITY    No Known Allergies  Current Outpatient Medications on File Prior to Visit  Medication Sig Dispense Refill  ciprofloxacin HCl (CIPRO) 500 MG tablet  escitalopram oxalate (LEXAPRO) 20 MG tablet Take 20 mg by mouth every morning  lithium carbonate 150 MG capsule  LORazepam (ATIVAN) 1 MG tablet Take 1 mg by  mouth 2 (two) times daily  metFORMIN (GLUCOPHAGE) 500 MG tablet Take 500 mg by mouth 2 (two) times daily  polyethylene glycol (MIRALAX) packet Take by mouth  tamsulosin (FLOMAX) 0.4 mg capsule 1 capsule  temazepam (RESTORIL) 15 mg capsule Take 15 mg by mouth at bedtime  timoloL maleate (TIMOPTIC) 0.5 % ophthalmic solution Apply to eye  acetaminophen (TYLENOL) 500 MG tablet Take 1,000 mg by mouth 3 (three) times daily  cholecalciferol (VITAMIN D3) 2,000 unit tablet Take 2,000 Units by mouth every morning  cyanocobalamin (VITAMIN B12) 100 MCG tablet Take by mouth  diclofenac (VOLTAREN) 1 % topical gel Apply 2 g topically 4 (four) times daily  ferrous sulfate 325 (65 FE) MG tablet Take by mouth  finasteride (PROSCAR) 5 mg tablet 1 tablet  latanoprost, PF, 0.005 % Drop 1 drop into affected eye in the evening  mirabegron (MYRBETRIQ) 25 mg ER Tablet Take by mouth  nettle-pumpkin-saw palm-min 17 (PROSTATE THERAPY) Cap Take by mouth  traZODone (DESYREL) 100 MG tablet Take 100 mg by mouth at bedtime   No current facility-administered medications on file prior to visit.   Family History  Problem Relation Age of Onset  Coronary Artery Disease (Blocked arteries around heart) Brother    Social History   Tobacco Use  Smoking Status Former  Types: Cigarettes  Quit date: 1990  Years since quitting: 33.5  Smokeless Tobacco Not on file    Social History   Socioeconomic History  Marital status: Divorced  Tobacco Use  Smoking status: Former  Types: Cigarettes  Quit date: 1990  Years since quitting: 33.5  Substance and Sexual Activity  Drug use: Never   Objective:   Vitals:  12/07/21 1447  BP: 112/60  Pulse: 82  Temp: 36.8 C (98.2 F)  SpO2: 96%  Weight: 71.6 kg (157 lb 12.8 oz)  Height: 185.4 cm (6\' 1" )   Body mass index is 20.82 kg/m.  Alert, calm, cooperative Unlabored respirations Abdomen soft and nontender. Large left inguinal hernia present  Assessment and Plan:   Diagnoses and all orders for this visit:  Non-recurrent unilateral inguinal hernia without obstruction or gangrene  Large left inguinal hernia containing colon. Recent worsened discomfort, also with chronic constipation that may or may not be exacerbated by the hernia. We discussed options going forward including resuming use of the hernia belt and ongoing observation, or open repair. In the case of observation, we discussed stability of stable symptoms versus worsening pain, bowel obstruction, incarceration/regulation and need for emergency surgery. We briefly discussed signs and symptoms that should prompt emergency evaluation. In the case of surgery, discussed the technique of open repair, use of mesh, risks of bleeding, infection, pain, scarring, injury to intra-abdominal or retroperitoneal structures, postop wound healing issues, seroma/hematoma, potential loss of the testicle, hernia recurrence, as well as general cardiovascular/pulmonary/thromboembolic complications. In general his risk of complications is much higher than average due to age and medical comorbidities and we discussed that. Discussed outpatient surgery, potential need for temporary skilled nursing versus returning to his assisted living facility. Questions welcomed and answered. He would like to think about surgery and he will let know if he wishes to proceed.  Jasman Pfeifle [place], MD

## 2021-12-26 NOTE — Patient Instructions (Signed)
SURGICAL WAITING ROOM VISITATION Patients having surgery or a procedure may have no more than 2 support people in the waiting area - these visitors may rotate.   Children under the age of 43 must have an adult with them who is not the patient. If the patient needs to stay at the hospital during part of their recovery, the visitor guidelines for inpatient rooms apply. Pre-op nurse will coordinate an appropriate time for 1 support person to accompany patient in pre-op.  This support person may not rotate.    Please refer to the [facility] website for the visitor guidelines for Inpatients (after your surgery is over and you are in a regular room).      Your procedure is scheduled on: 01-03-22   Report to [facility] Main Entrance    Report to admitting at 11:15 AM   Call this number if you have problems the morning of surgery [phone]   Do not eat food :After Midnight.   After Midnight you may have the following liquids until 10:30 AM DAY OF SURGERY  Water Non-Citrus Juices (without pulp, NO RED) Carbonated Beverages Black Coffee (NO MILK/CREAM OR CREAMERS, sugar ok)  Clear Tea (NO MILK/CREAM OR CREAMERS, sugar ok) regular and decaf                             Plain Jell-O (NO RED)                                           Fruit ices (not with fruit pulp, NO RED)                                     Popsicles (NO RED)                                                               Sports drinks like Gatorade (NO RED)                     If you have questions, please contact your surgeon's office.  FOLLOW ANY ADDITIONAL PRE OP INSTRUCTIONS YOU RECEIVED FROM YOUR SURGEON'S OFFICE!!!     Oral Hygiene is also important to reduce your risk of infection.                                    Remember - BRUSH YOUR TEETH THE MORNING OF SURGERY WITH YOUR REGULAR TOOTHPASTE   Do NOT smoke after Midnight  Take these medicines the morning of surgery with A SIP OF WATER:   Escitalopram, Finasteride, Lorazepam, Mirabegron.  Okay to use eyedrops  How to Manage Your Diabetes Before and After Surgery  Why is it important to control my blood sugar before and after surgery? Improving blood sugar levels before and after surgery helps healing and can limit problems. A way of improving blood sugar control is eating a healthy diet by:  Eating less sugar and carbohydrates  Increasing activity/exercise  Talking with your doctor about reaching your blood sugar goals High blood sugars (greater than 180 mg/dL) can raise your risk of infections and slow your recovery, so you will need to focus on controlling your diabetes during the weeks before surgery. Make sure that the doctor who takes care of your diabetes knows about your planned surgery including the date and location.  How do I manage my blood sugar before surgery? Check your blood sugar at least 4 times a day, starting 2 days before surgery, to make sure that the level is not too high or low. Check your blood sugar the morning of your surgery when you wake up and every 2 hours until you get to the Short Stay unit. If your blood sugar is less than 70 mg/dL, you will need to treat for low blood sugar: Do not take insulin. Treat a low blood sugar (less than 70 mg/dL) with  cup of clear juice (cranberry or apple), 4 glucose tablets, OR glucose gel. Recheck blood sugar in 15 minutes after treatment (to make sure it is greater than 70 mg/dL). If your blood sugar is not greater than 70 mg/dL on recheck, call [phone] for further instructions. Report your blood sugar to the short stay nurse when you get to Short Stay.  If you are admitted to the hospital after surgery: Your blood sugar will be checked by the staff and you will probably be given insulin after surgery (instead of oral diabetes medicines) to make sure you have good blood sugar levels. The goal for blood sugar control after surgery is 80-180  mg/dL.   WHAT DO I DO ABOUT MY DIABETES MEDICATION?  Do not take oral diabetes medicines (pills) the morning of surgery.  THE NIGHT BEFORE SURGERY:  Take Metformin as prescribed  THE MORNING OF SURGERY:  Do not take Metformin.  Reviewed and Endorsed by [facility] Patient Education Committee, August 2015   Bring CPAP mask and tubing day of surgery.                              You may not have any metal on your body including jewelry, and body piercing             Do not wear lotions, powders, cologne, or deodorant              Men may shave face and neck.   Do not bring valuables to the hospital. [facility] IS NOT RESPONSIBLE   FOR VALUABLES.   Contacts, dentures or bridgework may not be worn into surgery.  DO NOT BRING YOUR HOME MEDICATIONS TO THE HOSPITAL. PHARMACY WILL DISPENSE MEDICATIONS LISTED ON YOUR MEDICATION LIST TO YOU DURING YOUR ADMISSION IN THE HOSPITAL!   Patients discharged on the day of surgery will not be allowed to drive home.  Someone NEEDS to stay with you for the first 24 hours after anesthesia.  Special Instructions: Bring a copy of your healthcare power of attorney and living will documents the day of surgery if you haven't scanned them before.  Please read over the following fact sheets you were given: IF YOU HAVE QUESTIONS ABOUT YOUR PRE-OP INSTRUCTIONS PLEASE CALL [phone] [facility] - Preparing for Surgery Before surgery, you can play an important role.  Because skin is not sterile, your skin needs to be as free of germs as possible.  You can reduce the number of germs on your skin by  washing with CHG (chlorahexidine gluconate) soap before surgery.  CHG is an antiseptic cleaner which kills germs and bonds with the skin to continue killing germs even after washing. Please DO NOT use if you have an allergy to CHG or antibacterial soaps.  If your skin becomes reddened/irritated stop using the CHG and inform your nurse when you arrive at Short  Stay. Do not shave (including legs and underarms) for at least 48 hours prior to the first CHG shower.  You may shave your face/neck.  Please follow these instructions carefully:  1.  Shower with CHG Soap the night before surgery and the  morning of surgery.  2.  If you choose to wash your hair, wash your hair first as usual with your normal  shampoo.  3.  After you shampoo, rinse your hair and body thoroughly to remove the shampoo.                             4.  Use CHG as you would any other liquid soap.  You can apply chg directly to the skin and wash.  Gently with a scrungie or clean washcloth.  5.  Apply the CHG Soap to your body ONLY FROM THE NECK DOWN.   Do   not use on face/ open                           Wound or open sores. Avoid contact with eyes, ears mouth and   genitals (private parts).                       Wash face,  Genitals (private parts) with your normal soap.             6.  Wash thoroughly, paying special attention to the area where your    surgery  will be performed.  7.  Thoroughly rinse your body with warm water from the neck down.  8.  DO NOT shower/wash with your normal soap after using and rinsing off the CHG Soap.                9.  Pat yourself dry with a clean towel.            10.  Wear clean pajamas.            11.  Place clean sheets on your bed the night of your first shower and do not  sleep with pets. Day of Surgery : Do not apply any lotions/deodorants the morning of surgery.  Please wear clean clothes to the hospital/surgery center.  FAILURE TO FOLLOW THESE INSTRUCTIONS MAY RESULT IN THE CANCELLATION OF YOUR SURGERY  PATIENT SIGNATURE_________________________________  NURSE SIGNATURE__________________________________  ________________________________________________________________________

## 2021-12-27 NOTE — Progress Notes (Addendum)
COVID Vaccine Completed:  Yes  Date of COVID positive in last 90 days:  No, negative test on 12-28-21  PCP - Yehuda Mao, MD at [facility] - Peter [place], MD (last OV 2020 for syncope)  Chest x-ray - 12-20-21 On chart EKG - 02-05-21 Epic Stress Test - N/A ECHO - greater than 2 years Epic Cardiac Cath - N/A Pacemaker/ICD device last checked: Spinal Cord Stimulator: Long Term Monitor - 2020 Epic  Bowel Prep - N/A  Sleep Study - N/A CPAP -   Fasting Blood Sugar -  Checks Blood Sugar - patient states that it is checked occasionally at [facility]  Blood Thinner Instructions: N/A Aspirin Instructions: Last Dose:  Activity level:   Unable to climb stairs uses a walker or wheelchair to get around at [facility]   Anesthesia review:  Eval by cardiology in 2020 for syncope. Patient states that he has not had any syncopal episodes in a few years.    Hemoglobin 9.5 on PAT labs  Recent pneumonia 12/09/21 with residual cough, patient states that he is not coughing up any phlegm.  Dr. Derrill Memo office notified to see if surgery needs to be rescheduled.  Also requested note from [facility] physician stating if okay to proceed with surgery.    Patient denies shortness of breath, fever, and chest pain at PAT appointment  Patient verbalized understanding of instructions that were given to them at the PAT appointment. Patient was also instructed that they will need to review over the PAT instructions again at home before surgery.

## 2021-12-29 ENCOUNTER — Encounter (HOSPITAL_COMMUNITY)
Admission: RE | Admit: 2021-12-29 | Discharge: 2021-12-29 | Disposition: A | Discharge status: Home / Self Care | Payer: Medicare Other | Source: Ambulatory Visit | Attending: Surgery | Admitting: Surgery

## 2021-12-29 ENCOUNTER — Encounter (HOSPITAL_COMMUNITY): Payer: Self-pay

## 2021-12-29 ENCOUNTER — Other Ambulatory Visit: Payer: Self-pay

## 2021-12-29 VITALS — BP 125/51 | HR 77 | Temp 98.0°F | Resp 20 | Ht 71.0 in | Wt 160.9 lb

## 2021-12-29 DIAGNOSIS — E119 Type 2 diabetes mellitus without complications: Secondary | ICD-10-CM | POA: Insufficient documentation

## 2021-12-29 DIAGNOSIS — D649 Anemia, unspecified: Secondary | ICD-10-CM | POA: Insufficient documentation

## 2021-12-29 DIAGNOSIS — I2510 Atherosclerotic heart disease of native coronary artery without angina pectoris: Secondary | ICD-10-CM | POA: Insufficient documentation

## 2021-12-29 DIAGNOSIS — Z01812 Encounter for preprocedural laboratory examination: Secondary | ICD-10-CM | POA: Diagnosis not present

## 2021-12-29 HISTORY — DX: Anemia, unspecified: D64.9

## 2021-12-29 HISTORY — DX: Pneumonia, unspecified organism: J18.9

## 2021-12-29 HISTORY — DX: Personal history of urinary calculi: Z87.442

## 2021-12-29 HISTORY — DX: Unspecified osteoarthritis, unspecified site: M19.90

## 2021-12-29 LAB — BASIC METABOLIC PANEL
Anion gap: 6 (ref 5–15)
BUN: 10 mg/dL (ref 8–23)
CO2: 28 mmol/L (ref 22–32)
Calcium: 9.2 mg/dL (ref 8.9–10.3)
Chloride: 102 mmol/L (ref 98–111)
Creatinine, Ser: 0.68 mg/dL (ref 0.61–1.24)
GFR, Estimated: >60 mL/min (ref >60)
Glucose, Bld: 178 mg/dL — ABNORMAL HIGH (ref 70–99)
Potassium: 4.4 mmol/L (ref 3.5–5.1)
Sodium: 136 mmol/L (ref 135–145)

## 2021-12-29 LAB — CBC
HCT: 29.6 % — ABNORMAL LOW (ref 39.0–52.0)
Hemoglobin: 9.5 g/dL — ABNORMAL LOW (ref 13.0–17.0)
MCH: 31.9 pg (ref 26.0–34.0)
MCHC: 32.1 g/dL (ref 30.0–36.0)
MCV: 99.3 fL (ref 80.0–100.0)
Platelets: 347 10*3/uL (ref 150–400)
RBC: 2.98 MIL/uL — ABNORMAL LOW (ref 4.22–5.81)
RDW: 13 % (ref 11.5–15.5)
WBC: 6.7 10*3/uL (ref 4.0–10.5)
nRBC: 0 % (ref 0.0–0.2)

## 2021-12-29 LAB — HEMOGLOBIN A1C
Hgb A1c MFr Bld: 5.3 % (ref 4.8–5.6)
Mean Plasma Glucose: 105.41 mg/dL

## 2021-12-29 LAB — GLUCOSE, CAPILLARY: Glucose-Capillary: 217 mg/dL — ABNORMAL HIGH (ref 70–99)

## 2021-12-29 NOTE — Progress Notes (Signed)
Spoke to [place] at Dr. Derrill Memo office and made her aware that patient recently had pneumonia 12/09/21.  She will discuss with Dr. Fredricka Bonine to see if surgery needs to be rescheduled.

## 2021-12-29 NOTE — Progress Notes (Signed)
Russell Ayers at Dr. Derrill Memo office phoned stating that patient's surgery will be moved to a later date due to his recent pneumonia.  Their surgery scheduler will get in touch with family regarding new date.

## 2021-12-29 NOTE — Progress Notes (Signed)
Spoke to Berkey at [facility] and requested a note from the physician stating that it is safe for patient to have surgery with recent pneumonia diagnosis.

## 2022-01-11 NOTE — Progress Notes (Signed)
Preop instructions for:  Russell Ayers                         Date of Birth  - [birth year]                           Date of Procedure 02/08/2022        Doctor: Dr Phylliss Blakes  Time to arrive at [facility]:   0900 Report to: Admitting  Procedure: left inguinal hernia repair    Do not eat past midnight the night before your procedure.(To include any tube feedings-must be discontinued)  May have the following liquids am of procedure untl 0800am.    Water Non ctrus joines with no fruit pulp ( no red)  Carbonated beverages Coffee or tea- no milk cream or creamer Sport dirnks like gatorade ( no red)  Plain Jello ( no red)  Fruit ices or popsicles ( no red and no fruit pulp)     Take these morning medications only with sips of water.(or give through gastrostomy or feeding tube).     Note: No Insulin or Diabetic meds should be given or taken the morning of the procedure!   Facility contact:    [facility] [employer] [phone]               Phone:                  Health Care POA:  Transportation contact phone#:  Please send day of procedure:current med list and meds last taken that day, confirm nothing by mouth status from what time, Patient Demographic info( to include DNR status, problem list, allergies)   RN contact name/phone#:                             and Fax #:  Office manager card and picture ID Leave all jewelry and other valuables at place where living( no metal or rings to be worn) No contact lens Women-no make-up, no lotions,perfumes,powders Men-no colognes,lotions  Any questions day of procedure,call  SHORT STAY-[phone]   Sent from :[facility]                   Phone:[phone]                   Fax:[phone]  Sent by :     Cyndia Diver            RN

## 2022-01-11 NOTE — Progress Notes (Addendum)
Anesthesia Review:  PCP: Cardiologist : Chest x-ray : EKG : 02/05/21 and 01/16/22  Echo : 2020  Monitor- 2020  Stress test: Cardiac Cath :  Activity level:  Sleep Study/ CPAP : Fasting Blood Sugar :      / Checks Blood Sugar -- times a day:   Blood Thinner/ Instructions /Last Dose: ASA / Instructions/ Last Dose :   12/29/21-hgba1c- 5.3  DM- type  LIVes at [facility]-  Pneumonia- diagnosed 12/09/21- surgery delayed until 01/2022.  Spoke with nurse at facility on 01/11/22.  They are to fax, current [facility], medical diagnosis, most recent cxr, labs and ekg.  Daughter will be coming with him to preop appt.  PT in wheelchair for longdistance and uses walker for short distance per nurse.

## 2022-01-16 ENCOUNTER — Encounter (HOSPITAL_COMMUNITY)
Admission: RE | Admit: 2022-01-16 | Discharge: 2022-01-16 | Disposition: A | Discharge status: Home / Self Care | Payer: Medicare Other | Source: Ambulatory Visit | Attending: Family Medicine | Admitting: Family Medicine

## 2022-02-03 NOTE — Progress Notes (Signed)
Preop instructions for:     Russell Ayers Date of Birth:   [birth year]               Date of Procedure:   02-08-22 Procedure:     L inguinal hernia repair Surgeon: Dr. Phylliss Blakes Facility contact:     Phone:  [phone]               Health Care POA:  Andrew Au RN contact name/phone#:                          and Fax #: [phone]   Transportation contact phone#:  Andrew Au (daughter) [phone]    Time to arrive at [facility]:  8:15 AM   Report to: Admitting (On your left hand side)    Do not eat solid food past midnight the night before your procedure.(To include any tube feedings-must be discontinued)  May have the following liquids until 7:30 AM day of procedure  CLEAR LIQUID DIET  Water Black Coffee (sugar ok, NO MILK/CREAM OR CREAMERS)  Tea (sugar ok, NO MILK/CREAM OR CREAMERS) regular and decaf                             Plain Jell-O (NO RED)                                           Fruit ices (not with fruit pulp, NO RED)                                     Popsicles (NO RED)                                                                  Juice: apple, WHITE grape, WHITE cranberry Sports drinks like Gatorade (NO RED)   Take these morning medications only with sips of water.(or give through gastrostomy or feeding tube).  Escitalopram, Finasteride, Lorazepam, Mirabegron.  Okay to use eyedrops and Tylenol if needed   Note: No Insulin or Diabetic meds should be given or taken the morning of the procedure!    Please send day of procedure:current med list and meds last taken that day, confirm nothing by mouth status from what time, Patient Demographic info( to include DNR status, problem list, allergies)   Bring Insurance card and picture ID Leave all jewelry and other valuables at place where living( no metal or rings to be worn) No contact lens Women-no make-up, no lotions,perfumes,powders Men-no colognes,lotions   Any questions day of  procedure,call  SHORT [phone]     Sent from :[facility]                   Phone:[phone]                   Fax:[phone]   Sent by :     Derek Mound, RN

## 2022-02-03 NOTE — Progress Notes (Signed)
COVID Vaccine Completed:  Yes   Date of COVID positive in last 90 days:  No, tested at [facility] regularly.   PCP - Yehuda Mao, MD at [facility] Cardiologist - Peter [place], MD (last OV 2020 for syncope)   Chest x-ray - 12-20-21 On chart EKG - to be done day of surgery Stress Test - N/A ECHO - greater than 2 years Epic Cardiac Cath - N/A Pacemaker/ICD device last checked: Spinal Cord Stimulator: Long Term Monitor - 2020 Epic   Bowel Prep - N/A   Sleep Study - N/A CPAP -    Fasting Blood Sugar -  Checks Blood Sugar - patient states that it is checked occasionally at [facility]   Blood Thinner Instructions: N/A Aspirin Instructions: Last Dose:   Activity level:   Unable to climb stairs uses a walker or wheelchair to get around at [facility]     Anesthesia review:  Eval by cardiology in 2020 for syncope. Patient states that he has not had any syncopal episodes in a few years.    Initial surgery was rescheduled due to pneumonia diagnosis in July.  Per patient's daughter patient does not have any residual symptoms.    Patient denies shortness of breath, fever, and chest pain at PAT appointment   Patient verbalized understanding of instructions that were given to them at the PAT appointment. Patient was also instructed that they will need to review over the PAT instructions again at home before surgery.

## 2022-02-07 ENCOUNTER — Encounter (HOSPITAL_COMMUNITY): Payer: Self-pay | Admitting: Surgery

## 2022-02-07 NOTE — Anesthesia Preprocedure Evaluation (Signed)
Anesthesia Evaluation    Reviewed: Allergy & Precautions, Patient's Chart, lab work & pertinent test results, Unable to perform ROS - Chart review only  Airway Mallampati: III  TM Distance: >3 FB Neck ROM: Limited  Mouth opening: Limited Mouth Opening  Dental  (+) Poor Dentition, Partial Upper, Partial Lower, Missing, Dental Advisory Given, Chipped   Pulmonary former smoker,    Pulmonary exam normal breath sounds clear to auscultation       Cardiovascular hypertension, Pt. on medications  Rhythm:Regular Rate:Normal  Echo 02/2019 1. Left ventricular ejection fraction, by visual estimation, is 55%. The left ventricle has normal function. Normal left ventricular size. There is no left ventricular hypertrophy. Technically difficult study, hard to comment on motion of individual wall segments but overall EF appears normal.  2. Left ventricular diastolic Doppler parameters are consistent with impaired relaxation pattern of LV diastolic filling.  3. The tricuspid valve is normal in structure. Tricuspid valve regurgitation is trivial.  4. The aortic valve was not well visualized Aortic valve regurgitation was not visualized by color flow Doppler. Mild aortic valve sclerosis without stenosis.  5. There is mild dilatation of the aortic root measuring 37 mm.  6. Global right ventricle has normal systolic function.The right ventricular size is normal. No increase in right ventricular wall thickness.  7. Left atrial size was normal.  8. Right atrial size was normal.  9. The mitral valve is normal in structure. Trace mitral valve regurgitation. No evidence of mitral stenosis.  10. Moderate pericardial effusion. The effusion was primarily in the apical area and laterally. No evidence for tamponade.  11. The inferior vena cava is normal in size with greater than 50% respiratory variability, suggesting right atrial pressure of 3 mmHg.  12. The  tricuspid regurgitant velocity is 2.17 m/s, and with an assumed right atrial pressure of 3 mmHg, the estimated right ventricular systolic pressure is normal at 21.8 mmHg.    Neuro/Psych Seizures -, Well Controlled,  PSYCHIATRIC DISORDERS Anxiety CVA    GI/Hepatic negative GI ROS, Neg liver ROS,   Endo/Other  diabetes  Renal/GU negative Renal ROS     Musculoskeletal  (+) Arthritis ,   Abdominal   Peds  Hematology  (+) Blood dyscrasia, anemia ,   Anesthesia Other Findings  left hip fracture  Reproductive/Obstetrics                            Anesthesia Physical  Anesthesia Plan  ASA: 3  Anesthesia Plan: General   Post-op Pain Management: Tylenol PO (pre-op)*   Induction: Intravenous  PONV Risk Score and Plan: 2 and Ondansetron, Dexamethasone and Treatment may vary due to age or medical condition  Airway Management Planned: Oral ETT and Video Laryngoscope Planned  Additional Equipment:   Intra-op Plan:   Post-operative Plan: Extubation in OR  Informed Consent: I have reviewed the patients History and Physical, chart, labs and discussed the procedure including the risks, benefits and alternatives for the proposed anesthesia with the patient or authorized representative who has indicated his/her understanding and acceptance.     Dental advisory given  Plan Discussed with: CRNA  Anesthesia Plan Comments:        Anesthesia Quick Evaluation

## 2022-02-08 ENCOUNTER — Encounter (HOSPITAL_COMMUNITY)
Admission: RE | Disposition: A | Discharge status: Other Acute Inpatient Hospital | Payer: Self-pay | Source: Home / Self Care | Attending: Surgery

## 2022-02-08 ENCOUNTER — Ambulatory Visit (HOSPITAL_BASED_OUTPATIENT_CLINIC_OR_DEPARTMENT_OTHER): Payer: Medicare Other | Admitting: Physician Assistant

## 2022-02-08 ENCOUNTER — Ambulatory Visit (HOSPITAL_COMMUNITY): Payer: Medicare Other | Admitting: Physician Assistant

## 2022-02-08 ENCOUNTER — Other Ambulatory Visit: Payer: Self-pay

## 2022-02-08 ENCOUNTER — Ambulatory Visit (HOSPITAL_COMMUNITY)
Admission: RE | Admit: 2022-02-08 | Discharge: 2022-02-08 | Payer: Medicare Other | Attending: Surgery | Admitting: Surgery

## 2022-02-08 ENCOUNTER — Other Ambulatory Visit (HOSPITAL_COMMUNITY): Payer: Self-pay

## 2022-02-08 ENCOUNTER — Encounter (HOSPITAL_COMMUNITY): Payer: Self-pay | Admitting: Surgery

## 2022-02-08 DIAGNOSIS — K4090 Unilateral inguinal hernia, without obstruction or gangrene, not specified as recurrent: Secondary | ICD-10-CM | POA: Diagnosis present

## 2022-02-08 DIAGNOSIS — Z79899 Other long term (current) drug therapy: Secondary | ICD-10-CM | POA: Insufficient documentation

## 2022-02-08 DIAGNOSIS — Z87891 Personal history of nicotine dependence: Secondary | ICD-10-CM | POA: Diagnosis not present

## 2022-02-08 DIAGNOSIS — E119 Type 2 diabetes mellitus without complications: Secondary | ICD-10-CM | POA: Insufficient documentation

## 2022-02-08 DIAGNOSIS — Z8673 Personal history of transient ischemic attack (TIA), and cerebral infarction without residual deficits: Secondary | ICD-10-CM | POA: Insufficient documentation

## 2022-02-08 DIAGNOSIS — G8929 Other chronic pain: Secondary | ICD-10-CM | POA: Diagnosis not present

## 2022-02-08 DIAGNOSIS — I10 Essential (primary) hypertension: Secondary | ICD-10-CM | POA: Diagnosis not present

## 2022-02-08 DIAGNOSIS — H409 Unspecified glaucoma: Secondary | ICD-10-CM | POA: Insufficient documentation

## 2022-02-08 DIAGNOSIS — K4030 Unilateral inguinal hernia, with obstruction, without gangrene, not specified as recurrent: Secondary | ICD-10-CM | POA: Diagnosis not present

## 2022-02-08 DIAGNOSIS — F419 Anxiety disorder, unspecified: Secondary | ICD-10-CM | POA: Diagnosis not present

## 2022-02-08 DIAGNOSIS — D649 Anemia, unspecified: Secondary | ICD-10-CM | POA: Insufficient documentation

## 2022-02-08 DIAGNOSIS — M1990 Unspecified osteoarthritis, unspecified site: Secondary | ICD-10-CM | POA: Diagnosis not present

## 2022-02-08 DIAGNOSIS — I2510 Atherosclerotic heart disease of native coronary artery without angina pectoris: Secondary | ICD-10-CM

## 2022-02-08 DIAGNOSIS — F319 Bipolar disorder, unspecified: Secondary | ICD-10-CM | POA: Insufficient documentation

## 2022-02-08 HISTORY — PX: INGUINAL HERNIA REPAIR: SHX194

## 2022-02-08 LAB — CBC
HCT: 31.9 % — ABNORMAL LOW (ref 39.0–52.0)
Hemoglobin: 10.4 g/dL — ABNORMAL LOW (ref 13.0–17.0)
MCH: 31.9 pg (ref 26.0–34.0)
MCHC: 32.6 g/dL (ref 30.0–36.0)
MCV: 97.9 fL (ref 80.0–100.0)
Platelets: 248 10*3/uL (ref 150–400)
RBC: 3.26 MIL/uL — ABNORMAL LOW (ref 4.22–5.81)
RDW: 12.6 % (ref 11.5–15.5)
WBC: 7.5 10*3/uL (ref 4.0–10.5)
nRBC: 0 % (ref 0.0–0.2)

## 2022-02-08 LAB — BASIC METABOLIC PANEL
Anion gap: 6 (ref 5–15)
BUN: 14 mg/dL (ref 8–23)
CO2: 25 mmol/L (ref 22–32)
Calcium: 9.6 mg/dL (ref 8.9–10.3)
Chloride: 104 mmol/L (ref 98–111)
Creatinine, Ser: 0.8 mg/dL (ref 0.61–1.24)
GFR, Estimated: >60 mL/min (ref >60)
Glucose, Bld: 142 mg/dL — ABNORMAL HIGH (ref 70–99)
Potassium: 4.1 mmol/L (ref 3.5–5.1)
Sodium: 135 mmol/L (ref 135–145)

## 2022-02-08 LAB — GLUCOSE, CAPILLARY
Glucose-Capillary: 140 mg/dL — ABNORMAL HIGH (ref 70–99)
Glucose-Capillary: 137 mg/dL — ABNORMAL HIGH (ref 70–99)

## 2022-02-08 SURGERY — REPAIR, HERNIA, INGUINAL, ADULT
Anesthesia: General | Laterality: Left

## 2022-02-08 MED ORDER — ORAL CARE MOUTH RINSE: 15.0000 mL | Freq: Once | OROMUCOSAL | Status: AC

## 2022-02-08 MED ORDER — FENTANYL CITRATE PF 50 MCG/ML IJ SOSY: 25.0000 ug | PREFILLED_SYRINGE | INTRAMUSCULAR | Status: DC | PRN

## 2022-02-08 MED ORDER — SODIUM CHLORIDE 0.9% FLUSH: 3.0000 mL | INTRAVENOUS | Status: DC | PRN

## 2022-02-08 MED ORDER — BUPIVACAINE-EPINEPHRINE 0.25% -1:200000 IJ SOLN
INTRAMUSCULAR | Status: DC | PRN
  Administered 2022-02-08: 30 mL

## 2022-02-08 MED ORDER — LACTATED RINGERS IV SOLN: INTRAVENOUS | Status: DC

## 2022-02-08 MED ORDER — CHLORHEXIDINE GLUCONATE 4 % EX LIQD: 60.0000 mL | Freq: Once | CUTANEOUS | Status: DC

## 2022-02-08 MED ORDER — BUPIVACAINE-EPINEPHRINE (PF) 0.25% -1:200000 IJ SOLN
INTRAMUSCULAR | Status: AC
  Filled 2022-02-08: qty 30

## 2022-02-08 MED ORDER — FENTANYL CITRATE (PF) 100 MCG/2ML IJ SOLN
INTRAMUSCULAR | Status: AC
  Filled 2022-02-08: qty 2

## 2022-02-08 MED ORDER — FENTANYL CITRATE (PF) 100 MCG/2ML IJ SOLN
INTRAMUSCULAR | Status: DC | PRN
  Administered 2022-02-08 (×2): 50 ug via INTRAVENOUS

## 2022-02-08 MED ORDER — PHENYLEPHRINE 80 MCG/ML (10ML) SYRINGE FOR IV PUSH (FOR BLOOD PRESSURE SUPPORT)
PREFILLED_SYRINGE | INTRAVENOUS | Status: AC
  Filled 2022-02-08: qty 10

## 2022-02-08 MED ORDER — ONDANSETRON HCL 4 MG/2ML IJ SOLN
INTRAMUSCULAR | Status: DC | PRN
  Administered 2022-02-08: 4 mg via INTRAVENOUS

## 2022-02-08 MED ORDER — OXYCODONE HCL 5 MG PO TABS: 5.0000 mg | ORAL_TABLET | ORAL | Status: DC | PRN

## 2022-02-08 MED ORDER — ACETAMINOPHEN 500 MG PO TABS
1000.0000 mg | ORAL_TABLET | ORAL | Status: AC
  Administered 2022-02-08: 1000 mg via ORAL
  Filled 2022-02-08: qty 2

## 2022-02-08 MED ORDER — TRAMADOL HCL 50 MG PO TABS
50.0000 mg | ORAL_TABLET | Freq: Four times a day (QID) | ORAL | 0 refills | Status: AC | PRN
  Filled 2022-02-08: qty 12, 3d supply, fill #0

## 2022-02-08 MED ORDER — PROPOFOL 10 MG/ML IV BOLUS
INTRAVENOUS | Status: DC | PRN
  Administered 2022-02-08: 70 mg via INTRAVENOUS

## 2022-02-08 MED ORDER — SODIUM CHLORIDE 0.9% FLUSH: 3.0000 mL | Freq: Two times a day (BID) | INTRAVENOUS | Status: DC

## 2022-02-08 MED ORDER — ACETAMINOPHEN 650 MG RE SUPP: 650.0000 mg | RECTAL | Status: DC | PRN

## 2022-02-08 MED ORDER — DEXAMETHASONE SODIUM PHOSPHATE 10 MG/ML IJ SOLN
INTRAMUSCULAR | Status: DC | PRN
  Administered 2022-02-08: 4 mg via INTRAVENOUS

## 2022-02-08 MED ORDER — BUPIVACAINE LIPOSOME 1.3 % IJ SUSP
INTRAMUSCULAR | Status: DC | PRN
  Administered 2022-02-08: 20 mL

## 2022-02-08 MED ORDER — BUPIVACAINE LIPOSOME 1.3 % IJ SUSP: 20.0000 mL | Freq: Once | INTRAMUSCULAR | Status: DC

## 2022-02-08 MED ORDER — CHLORHEXIDINE GLUCONATE 0.12 % MT SOLN
15.0000 mL | Freq: Once | OROMUCOSAL | Status: AC
  Administered 2022-02-08: 15 mL via OROMUCOSAL

## 2022-02-08 MED ORDER — 0.9 % SODIUM CHLORIDE (POUR BTL) OPTIME
TOPICAL | Status: DC | PRN
  Administered 2022-02-08: 1000 mL

## 2022-02-08 MED ORDER — PROPOFOL 10 MG/ML IV BOLUS
INTRAVENOUS | Status: AC
  Filled 2022-02-08: qty 40

## 2022-02-08 MED ORDER — MIDAZOLAM HCL 2 MG/2ML IJ SOLN
INTRAMUSCULAR | Status: AC
  Filled 2022-02-08: qty 2

## 2022-02-08 MED ORDER — ROCURONIUM BROMIDE 10 MG/ML (PF) SYRINGE
PREFILLED_SYRINGE | INTRAVENOUS | Status: DC | PRN
  Administered 2022-02-08: 50 mg via INTRAVENOUS
  Administered 2022-02-08: 20 mg via INTRAVENOUS

## 2022-02-08 MED ORDER — SODIUM CHLORIDE 0.9 % IV SOLN: 250.0000 mL | INTRAVENOUS | Status: DC | PRN

## 2022-02-08 MED ORDER — PHENYLEPHRINE 80 MCG/ML (10ML) SYRINGE FOR IV PUSH (FOR BLOOD PRESSURE SUPPORT)
PREFILLED_SYRINGE | INTRAVENOUS | Status: DC | PRN
  Administered 2022-02-08: 40 ug via INTRAVENOUS
  Administered 2022-02-08 (×4): 80 ug via INTRAVENOUS
  Administered 2022-02-08: 40 ug via INTRAVENOUS

## 2022-02-08 MED ORDER — BUPIVACAINE LIPOSOME 1.3 % IJ SUSP
INTRAMUSCULAR | Status: AC
  Filled 2022-02-08: qty 20

## 2022-02-08 MED ORDER — LIDOCAINE 2% (20 MG/ML) 5 ML SYRINGE
INTRAMUSCULAR | Status: DC | PRN
  Administered 2022-02-08: 75 mg via INTRAVENOUS

## 2022-02-08 MED ORDER — ACETAMINOPHEN 325 MG PO TABS: 650.0000 mg | ORAL_TABLET | ORAL | Status: DC | PRN

## 2022-02-08 MED ORDER — CEFAZOLIN SODIUM-DEXTROSE 2-4 GM/100ML-% IV SOLN
2.0000 g | INTRAVENOUS | Status: AC
  Administered 2022-02-08: 2 g via INTRAVENOUS
  Filled 2022-02-08: qty 100

## 2022-02-08 SURGICAL SUPPLY — 41 items
BAG COUNTER SPONGE SURGICOUNT (BAG) IMPLANT
BENZOIN TINCTURE PRP APPL 2/3 (GAUZE/BANDAGES/DRESSINGS) ×1 IMPLANT
BLADE SURG 15 STRL LF DISP TIS (BLADE) ×1 IMPLANT
BLADE SURG 15 STRL SS (BLADE) ×1
CHLORAPREP W/TINT 26 (MISCELLANEOUS) ×1 IMPLANT
COVER SURGICAL LIGHT HANDLE (MISCELLANEOUS) ×1 IMPLANT
DRAIN PENROSE 0.5X18 (DRAIN) ×1 IMPLANT
DRAPE LAPAROSCOPIC ABDOMINAL (DRAPES) ×1 IMPLANT
ELECT REM PT RETURN 15FT ADLT (MISCELLANEOUS) ×1 IMPLANT
GAUZE SPONGE 4X4 12PLY STRL (GAUZE/BANDAGES/DRESSINGS) IMPLANT
GLOVE BIO SURGEON STRL SZ 6 (GLOVE) ×1 IMPLANT
GLOVE INDICATOR 6.5 STRL GRN (GLOVE) ×1 IMPLANT
GLOVE SS BIOGEL STRL SZ 6 (GLOVE) ×1 IMPLANT
GOWN STRL REUS W/ TWL LRG LVL3 (GOWN DISPOSABLE) ×1 IMPLANT
GOWN STRL REUS W/ TWL XL LVL3 (GOWN DISPOSABLE) IMPLANT
GOWN STRL REUS W/TWL LRG LVL3 (GOWN DISPOSABLE) ×1
GOWN STRL REUS W/TWL XL LVL3 (GOWN DISPOSABLE)
KIT BASIN OR (CUSTOM PROCEDURE TRAY) ×1 IMPLANT
KIT TURNOVER KIT A (KITS) IMPLANT
MARKER SKIN DUAL TIP RULER LAB (MISCELLANEOUS) ×1 IMPLANT
MESH PARIETEX PROGRIP LEFT (Mesh General) IMPLANT
NEEDLE HYPO 22GX1.5 SAFETY (NEEDLE) ×1 IMPLANT
PACK BASIC VI WITH GOWN DISP (CUSTOM PROCEDURE TRAY) ×1 IMPLANT
PENCIL SMOKE EVACUATOR (MISCELLANEOUS) IMPLANT
SPIKE FLUID TRANSFER (MISCELLANEOUS) ×1 IMPLANT
SPONGE T-LAP 4X18 ~~LOC~~+RFID (SPONGE) ×1 IMPLANT
STRIP CLOSURE SKIN 1/2X4 (GAUZE/BANDAGES/DRESSINGS) ×1 IMPLANT
SUT ETHIBOND 0 MO6 C/R (SUTURE) ×1 IMPLANT
SUT MNCRL AB 4-0 PS2 18 (SUTURE) ×1 IMPLANT
SUT PDS AB 0 CT1 36 (SUTURE) ×2 IMPLANT
SUT SILK 3 0 (SUTURE) ×1
SUT SILK 3-0 18XBRD TIE 12 (SUTURE) ×1 IMPLANT
SUT VIC AB 3-0 SH 27 (SUTURE) ×2
SUT VIC AB 3-0 SH 27XBRD (SUTURE) ×2 IMPLANT
SUT VICRYL 0 UR6 27IN ABS (SUTURE) IMPLANT
SUT VICRYL 3 0 BR 18  UND (SUTURE) ×1
SUT VICRYL 3 0 BR 18 UND (SUTURE) ×1 IMPLANT
SYR CONTROL 10ML LL (SYRINGE) ×1 IMPLANT
TOWEL OR 17X26 10 PK STRL BLUE (TOWEL DISPOSABLE) ×1 IMPLANT
TOWEL OR NON WOVEN STRL DISP B (DISPOSABLE) ×1 IMPLANT
TRAY FOLEY MTR SLVR 16FR STAT (SET/KITS/TRAYS/PACK) IMPLANT

## 2022-02-08 NOTE — Op Note (Signed)
Operative Note  Russell Ayers  [id number]  [id number]  02/08/2022   Surgeon: Romana Juniper MD FACS   Procedure performed: Open left inguinal hernia repair with mesh   Preop diagnosis:  left inguinal hernia   Post-op diagnosis/intraop findings:  chronically incarcerated left indirect inguinal hernia containing colon   Specimens: none   EBL: 5cc   Complications: none   Description of procedure: After confirming informed consent, the patient was taken to the operating room and placed supine on operating room table where general anesthesia was initiated, preoperative antibiotics were administered, SCDs applied, and a formal timeout was performed. The groin was clipped, prepped and draped in the usual sterile fashion. An oblique incision was made the just above the inguinal ligament after infiltrating the tissues with local anesthetic (Exparel mixed with quarter percent Marcaine with epinephrine). Soft tissues were dissected using electrocautery until the external oblique aponeurosis was encountered. This was divided sharply to expand the external ring. A plane was bluntly developed between the spermatic cord/hernia sac and the external oblique. The ilioinguinal nerve was divided. The spermatic cord was then bluntly dissected away from the pubic tubercle and encircled with a Penrose. Inspection of the inguinal anatomy revealed a large indirect hernia sac. The indirect hernia sac was bluntly dissected away from the cord structures and skeletonized to the level of the internal ring, where it was reduced intact into the abdomen.  The inguinal floor was reconstructed suturing the conjoined tendon to the inguinal ligament with interrupted 0 PDS, leaving an internal ring just sufficient for the cord structures. A ProGrip mesh was brought onto the field and trimmed to approximate the field. This was sutured to the pubic tubercle fascia, newly created internal ring and to the internal oblique superiorly with  interrupted 0 ethibonds. The tail of the mesh were wrapped around the spermatic cord, ensuring adequate room for the cord, and sutured to each other with 0 ethibond.  The lateral edge of the mesh was directed laterally to lie flat beneath the external oblique aponeurosis. Hemostasis was ensured within the wound. The Penrose was removed. The external oblique aponeurosis was reapproximated with a running 3-0 Vicryl to re-create a narrowed external ring. More local was infiltrated around the pubic tubercle and in the plane just below the external oblique. The Scarpa's was reapproximated with interrupted 3-0 Vicryls. The skin was closed with a running subcuticular 4-0 Monocryl. The remainder of the local was injected in the subcutaneous and subcuticular space. The field was then cleaned, benzoin and Steri-Strips and sterile bandage were applied. Both testicles were palpated in the scrotum at the end of the case. The patient was then awakened extubated and taken to PACU in stable condition.    All counts were correct at the completion of the case

## 2022-02-08 NOTE — H&P (Signed)
Russell Ayers [id number]   Referring Provider: Self  Subjective   Chief Complaint: Hernia   History of Present Illness:  88 year old man with history of hypertension, diabetes, depression/anxiety, bipolar disorder, glaucoma, insomnia, prostatism with indwelling Foley, left hip fracture status post ORIF, chronic pain/osteoarthritis, stroke, anemia, inguinal hernia. He is a resident at [facility].  He underwent a CT scan a couple weeks ago confirming a large left inguinal hernia containing mesenteric fat and sigmoid colon without obstruction or incarceration. Also noted bronchiectasis and mucous plugging, moderate hiatal hernia, thick-walled bladder with indwelling Foley. He was evaluated here a few years ago for the hernia and at that time elected to manage with a hernia belt and expectantly. Recently he has had more discomfort related to the hernia. He has not been wearing his hernia belt due to the indwelling Foley. Notes some constipation as well.  Review of Systems: A complete review of systems was obtained from the patient. I have reviewed this information and discussed as appropriate with the patient. See HPI as well for other ROS.  Medical History: Past Medical History:  Diagnosis Date  Anxiety  Asthma, unspecified asthma severity, unspecified whether complicated, unspecified whether persistent  Diabetes mellitus without complication (CMS-HCC)  Glaucoma (increased eye pressure)  Hypertension   There is no problem list on file for this patient.  Past Surgical History:  Procedure Laterality Date  cataract surgery  STAB PHLEBECTOMY VARICOSE VEINS ONE EXTREMITY    No Known Allergies  Current Outpatient Medications on File Prior to Visit  Medication Sig Dispense Refill  ciprofloxacin HCl (CIPRO) 500 MG tablet  escitalopram oxalate (LEXAPRO) 20 MG tablet Take 20 mg by mouth every morning  lithium carbonate 150 MG capsule  LORazepam (ATIVAN) 1 MG tablet Take 1 mg by  mouth 2 (two) times daily  metFORMIN (GLUCOPHAGE) 500 MG tablet Take 500 mg by mouth 2 (two) times daily  polyethylene glycol (MIRALAX) packet Take by mouth  tamsulosin (FLOMAX) 0.4 mg capsule 1 capsule  temazepam (RESTORIL) 15 mg capsule Take 15 mg by mouth at bedtime  timoloL maleate (TIMOPTIC) 0.5 % ophthalmic solution Apply to eye  acetaminophen (TYLENOL) 500 MG tablet Take 1,000 mg by mouth 3 (three) times daily  cholecalciferol (VITAMIN D3) 2,000 unit tablet Take 2,000 Units by mouth every morning  cyanocobalamin (VITAMIN B12) 100 MCG tablet Take by mouth  diclofenac (VOLTAREN) 1 % topical gel Apply 2 g topically 4 (four) times daily  ferrous sulfate 325 (65 FE) MG tablet Take by mouth  finasteride (PROSCAR) 5 mg tablet 1 tablet  latanoprost, PF, 0.005 % Drop 1 drop into affected eye in the evening  mirabegron (MYRBETRIQ) 25 mg ER Tablet Take by mouth  nettle-pumpkin-saw palm-min 17 (PROSTATE THERAPY) Cap Take by mouth  traZODone (DESYREL) 100 MG tablet Take 100 mg by mouth at bedtime   No current facility-administered medications on file prior to visit.   Family History  Problem Relation Age of Onset  Coronary Artery Disease (Blocked arteries around heart) Brother    Social History   Tobacco Use  Smoking Status Former  Types: Cigarettes  Quit date: 1990  Years since quitting: 33.5  Smokeless Tobacco Not on file    Social History   Socioeconomic History  Marital status: Divorced  Tobacco Use  Smoking status: Former  Types: Cigarettes  Quit date: 1990  Years since quitting: 33.5  Substance and Sexual Activity  Drug use: Never   Objective:   Vitals:  12/07/21 1447  BP: 112/60  Pulse: 82  Temp: 36.8 C (98.2 F)  SpO2: 96%  Weight: 71.6 kg (157 lb 12.8 oz)  Height: 185.4 cm (6' 1")   Body mass index is 20.82 kg/m.  Alert, calm, cooperative Unlabored respirations Abdomen soft and nontender. Large left inguinal hernia present  Assessment and Plan:   Diagnoses and all orders for this visit:  Non-recurrent unilateral inguinal hernia without obstruction or gangrene  Large left inguinal hernia containing colon. Recent worsened discomfort, also with chronic constipation that may or may not be exacerbated by the hernia. We discussed options going forward including resuming use of the hernia belt and ongoing observation, or open repair. In the case of observation, we discussed stability of stable symptoms versus worsening pain, bowel obstruction, incarceration/regulation and need for emergency surgery. We briefly discussed signs and symptoms that should prompt emergency evaluation. In the case of surgery, discussed the technique of open repair, use of mesh, risks of bleeding, infection, pain, scarring, injury to intra-abdominal or retroperitoneal structures, postop wound healing issues, seroma/hematoma, potential loss of the testicle, hernia recurrence, as well as general cardiovascular/pulmonary/thromboembolic complications. In general his risk of complications is much higher than average due to age and medical comorbidities and we discussed that. Discussed outpatient surgery, potential need for temporary skilled nursing versus returning to his assisted living facility. Questions welcomed and answered. He would like to think about surgery and he will let us know if he wishes to proceed.  Russell Ayers Russell Ayers Russell Ivan, MD   

## 2022-02-08 NOTE — Anesthesia Procedure Notes (Signed)
Procedure Name: Intubation Date/Time: 02/08/2022 9:10 AM  Performed by: Lavina Hamman, CRNAPre-anesthesia Checklist: Patient identified, Emergency Drugs available, Suction available, Patient being monitored and Timeout performed Patient Re-evaluated:Patient Re-evaluated prior to induction Oxygen Delivery Method: Circle system utilized Preoxygenation: Pre-oxygenation with 100% oxygen Induction Type: IV induction Ventilation: Mask ventilation without difficulty Laryngoscope Size: Glidescope and 3 Grade View: Grade I Tube type: Oral Tube size: 7.0 mm Number of attempts: 1 Airway Equipment and Method: Rigid stylet and Video-laryngoscopy Placement Confirmation: ETT inserted through vocal cords under direct vision, positive ETCO2, CO2 detector and breath sounds checked- equal and bilateral Secured at: 22 cm Tube secured with: Tape Dental Injury: Teeth and Oropharynx as per pre-operative assessment  Difficulty Due To: Difficulty was anticipated, Difficult Airway- due to limited oral opening and Difficult Airway- due to dentition Comments: ATOI

## 2022-02-08 NOTE — Anesthesia Postprocedure Evaluation (Signed)
Anesthesia Post Note  Patient: Russell Ayers  Procedure(s) Performed: LEFT HERNIA REPAIR INGUINAL ADULT (Left)     Patient location during evaluation: PACU Anesthesia Type: General Level of consciousness: sedated and patient cooperative Pain management: pain level controlled Vital Signs Assessment: post-procedure vital signs reviewed and stable Respiratory status: spontaneous breathing Cardiovascular status: stable Anesthetic complications: yes   Encounter Notable Events  Notable Event Outcome Phase Comment  Difficult to intubate - expected  Intraprocedure Filed from anesthesia note documentation.    Last Vitals:  Vitals:   02/08/22 1045 02/08/22 1100  BP: (!) 155/50 (!) 153/63  Pulse: 69 68  Resp: 20 16  Temp:  (!) 36.4 C  SpO2: 100% 100%    Last Pain:  Vitals:   02/08/22 1100  TempSrc:   PainSc: 0-No pain                 Nolon Nations

## 2022-02-08 NOTE — Transfer of Care (Signed)
Immediate Anesthesia Transfer of Care Note  Patient: Russell Ayers  Procedure(s) Performed: Procedure(s): LEFT HERNIA REPAIR INGUINAL ADULT (Left)  Patient Location: PACU  Anesthesia Type:General  Level of Consciousness:  sedated, patient cooperative and responds to stimulation  Airway & Oxygen Therapy:Patient Spontanous Breathing and Patient connected to face mask oxgen  Post-op Assessment:  Report given to PACU RN and Post -op Vital signs reviewed and stable  Post vital signs:  Reviewed and stable  Last Vitals:  Vitals:   02/08/22 0821  BP: (!) 185/88  Pulse: 70  Resp: 17  Temp: 36.8 C  SpO2: 77%    Complications: No apparent anesthesia complications

## 2022-02-08 NOTE — Discharge Instructions (Signed)
HERNIA REPAIR: POST OP INSTRUCTIONS   EAT Gradually transition to a high fiber diet with a fiber supplement over the next few weeks after discharge.  Start with a pureed / full liquid diet (see below)  WALK Walk as tolerated.  Control your pain to do that.    CONTROL PAIN Control pain so that you can walk, sleep, tolerate sneezing/coughing, etc.  HAVE A BOWEL MOVEMENT DAILY Keep your bowels regular to avoid problems.  OK to try a laxative to override constipation.  OK to use an antidairrheal to slow down diarrhea.  Call if not better after 2 tries  CALL IF YOU HAVE PROBLEMS/CONCERNS Call if you are still struggling despite following these instructions. Call if you have concerns not answered by these instructions  ######################################################################    DIET: Follow a light bland diet & liquids the first 24 hours after arrival home, such as soup, liquids, starches, etc.  Be sure to drink plenty of fluids.  Quickly advance to a usual solid diet within a few days.  Avoid fast food or heavy meals as your are more likely to get nauseated or have irregular bowels.   Take your usually prescribed home medications unless otherwise directed.  PAIN CONTROL: Pain is best controlled by a usual combination of three different methods TOGETHER: Ice/Heat Over the counter pain medication Prescription pain medication Most patients will experience some swelling and bruising around the hernia(s) such as the bellybutton, groins, or old incisions.  Ice packs or heating pads (30-60 minutes up to 6 times a day) will help. Use ice for the first few days to help decrease swelling and bruising, then switch to heat to help relax tight/sore spots and speed recovery.  Some people prefer to use ice alone, heat alone, alternating between ice & heat.  Experiment to what works for you.  Swelling and bruising can take several weeks to resolve.   It is helpful to take an  over-the-counter pain medication regularly for the first days: Naproxen (Aleve, etc)  Two 220mg  tabs twice a day OR Ibuprofen (Advil, etc) Three 200mg  tabs four times a day (every meal & bedtime) AND Acetaminophen (Tylenol, etc) 325-650mg  four times a day (every meal & bedtime) A  prescription for pain medication should be given to you upon discharge.  Take your pain medication as prescribed, IF NEEDED.  If you are having problems/concerns with the prescription medicine (does not control pain, nausea, vomiting, rash, itching, etc), please call us [phone] to see if we need to switch you to a different pain medicine that will work better for you and/or control your side effect better. If you need a refill on your pain medication, please contact your pharmacy.  They will contact our office to request authorization. Prescriptions will not be filled after 5 pm or on week-ends.  Avoid getting constipated.  Between the surgery and the pain medications, it is common to experience some constipation.  Increasing fluid intake and taking a fiber supplement (such as Metamucil, Citrucel, FiberCon, MiraLax, etc) 1-2 times a day regularly will usually help prevent this problem from occurring.  A mild laxative (prune juice, Milk of Magnesia, MiraLax, etc) should be taken according to package directions if there are no bowel movements after 48 hours.    Wash / shower every day, starting 2 days after surgery.  You may shower over the steri strips or skin glue which are waterproof.  No rubbing, scrubbing, lotions or ointments to incision(s). Do not soak or submerge.  Remove your outer bandage 2 days after surgery. Steri strips (if present) will peel off after 1-2 weeks. You may leave the incision open to air.  You may replace a dressing/Band-Aid to cover an incision for comfort if you wish.  Continue to shower over incision(s) after the dressing is off.  ACTIVITIES as tolerated:   You may resume regular (light)  daily activities beginning the next day--such as daily self-care, walking, climbing stairs--gradually increasing activities as tolerated.  Control your pain so that you can walk an hour a day.  If you can walk 30 minutes without difficulty, it is safe to try more intense activity such as jogging, treadmill, bicycling, low-impact aerobics, swimming, etc. Refrain from the most intensive and strenuous activity such as sit-ups, heavy lifting, contact sports, etc  Refrain from any heavy lifting or straining until 6 weeks after surgery.   DO NOT PUSH THROUGH PAIN.  Let pain be your guide: If it hurts to do something, don't do it.  Pain is your body warning you to avoid that activity for another week until the pain goes down. You may drive when you are no longer taking prescription pain medication, you can comfortably wear a seatbelt, and you can safely maneuver your car and apply brakes. You may have sexual intercourse when it is comfortable.  OK to continue wearing hernia belt/ Truss if this is comfortable. A scrotal support garment will als help relieve pressure/pain in the area of the hernia repair.   FOLLOW UP in our office Please call [facility] at [phone] to set up an appointment to see your surgeon in the office for a follow-up appointment approximately 2-3 weeks after your surgery. Make sure that you call for this appointment the day you arrive home to insure a convenient appointment time.  9.  If you have disability of FMLA / Family leave forms, please bring the forms to the office for processing.  (do not give to your surgeon).  WHEN TO CALL us [phone]: Poor pain control Reactions / problems with new medications (rash/itching, nausea, etc)  Fever over 101.5 F (38.5 C) Inability to urinate Nausea and/or vomiting Worsening swelling or bruising Continued bleeding from incision. Increased pain, redness, or drainage from the incision   The clinic staff is available to answer your  questions during regular business hours (8:30am-5pm).  Please don't hesitate to call and ask to speak to one of our nurses for clinical concerns.   If you have a medical emergency, go to the nearest emergency room or call 911.  A surgeon from [facility] is always on call at the hospitals in [facility], [place] [address], [address], [place], [place]  [address] ?  [address] [address], [place], [place]   [address] MAIN: [phone] ? TOLL FREE: [phone] ? FAX: [phone] [email or web address]

## 2022-02-09 ENCOUNTER — Encounter (HOSPITAL_COMMUNITY): Payer: Self-pay | Admitting: Surgery

## 2022-03-17 NOTE — Progress Notes (Deleted)
Cardiology Office Note   Date:  03/17/2022   ID:  Russell Ayers, DOB 1932/09/05, MRN [id number]  PCP:  Soundra Pilon, FNP  Cardiologist:   Shelagh Rayman [place], MD   No chief complaint on file.     History of Present Illness: Russell Ayers is a 88 y.o. male who is seen for follow up of syncope. He has a history of HTN, glaucoma, and DM diet controlled. Last seen in November 2020  He was seen in September 2020 after an episode of syncope. He states  he was watching TV until 12 midnight. He believes he got up to go to bed but the next thing he knew he was on the floor. Did not report any dizziness, pain, clamminess, dyspnea, confusion or loss of bowel/bladder continence. He states he was on his back and had a difficult time getting up. Able to crawl to door and called EMS. Did not go to ED. No HA or other Neurologic symptoms. Has never passed out before but has had falls.   We performed an Echo which was normal except for small to moderate apical pericardial effusion. Zio patch monitor which showed only mild arrhythmia.  He has no recurrent symptoms of syncope or dizziness.     Past Medical History:  Diagnosis Date   Anemia    Arthritis    Diabetes mellitus without complication (HCC)    Generalized anxiety disorder    Glaucoma    History of kidney stones    Hypertension    Inguinal hernia    Insomnia    Pneumonia    July 2023   Prostatism    Stroke [facility])     Past Surgical History:  Procedure Laterality Date   CATARACT EXTRACTION, BILATERAL     FEMUR IM NAIL Left 02/05/2021   Procedure: INTRAMEDULLARY (IM) NAIL FEMORAL;  Surgeon: Sheral Apley, MD;  Location: [facility];  Service: Orthopedics;  Laterality: Left;   glaucoma     HIP SURGERY Left    INGUINAL HERNIA REPAIR Left 02/08/2022   Procedure: LEFT HERNIA REPAIR INGUINAL ADULT;  Surgeon: Berna Bue, MD;  Location: [facility];  Service: General;  Laterality: Left;     Current Outpatient Medications  Medication  Sig Dispense Refill   acetaminophen (TYLENOL) 500 MG tablet Take 1,000 mg by mouth 3 (three) times daily.     Cholecalciferol (VITAMIN D3) 50 MCG (2000 UT) TABS Take 2,000 Units by mouth every morning.     diclofenac Sodium (VOLTAREN) 1 % GEL Apply 2 g topically in the morning and at bedtime. To right foot toes, left hip and both knees.     docusate sodium (COLACE) 100 MG capsule Take 100 mg by mouth 2 (two) times daily.     escitalopram (LEXAPRO) 20 MG tablet Take 20 mg by mouth every morning.     ferrous sulfate 325 (65 FE) MG tablet Take 325 mg by mouth daily.     finasteride (PROSCAR) 5 MG tablet Take 5 mg by mouth every morning.     furosemide (LASIX) 20 MG tablet Take 20 mg by mouth daily.     LACTOBACILLUS PO Take 3 capsules by mouth 2 (two) times daily before a meal.     latanoprost (XALATAN) 0.005 % ophthalmic solution Place 1 drop into both eyes every evening.     lithium carbonate 150 MG capsule Take 150 mg by mouth at bedtime.     LORazepam (ATIVAN) 1 MG tablet Take 1  mg by mouth 2 (two) times daily.     metFORMIN (GLUCOPHAGE) 500 MG tablet Take 500 mg by mouth 2 (two) times daily.     mirabegron ER (MYRBETRIQ) 25 MG TB24 tablet Take 25 mg by mouth in the morning.     Misc Natural Products (PROSTATE THERAPY COMPLEX) CAPS Take 1 capsule by mouth every evening. Nettle-pumpkin-saw palm-min 17)     nitrofurantoin, macrocrystal-monohydrate, (MACROBID) 100 MG capsule Take 100 mg by mouth 2 (two) times daily.     polyethylene glycol (MIRALAX / GLYCOLAX) 17 g packet Take 17 g by mouth daily as needed for mild constipation. 14 each 0   tamsulosin (FLOMAX) 0.4 MG CAPS capsule Take 0.4 mg by mouth every evening.     temazepam (RESTORIL) 15 MG capsule Take 15 mg by mouth at bedtime.     timolol (TIMOPTIC) 0.5 % ophthalmic solution Place 1 drop into both eyes every evening.     traZODone (DESYREL) 100 MG tablet Take 100 mg by mouth at bedtime.     vitamin B-12 (CYANOCOBALAMIN) 100 MCG tablet  Take 100 mcg by mouth every morning.     zinc oxide 20 % ointment Apply 1 application. topically See admin instructions. Apply topically to buttocks every shift     No current facility-administered medications for this visit.    Allergies:   Patient has no known allergies.    Social History:  The patient  reports that he quit smoking about 33 years ago. His smoking use included cigarettes. He has never used smokeless tobacco. He reports that he does not drink alcohol and does not use drugs.   Family History:  The patient's family history includes Cancer in his brother; Heart attack in his brother.    ROS:  Please see the history of present illness.   Otherwise, review of systems are positive for none.   All other systems are reviewed and negative.    PHYSICAL EXAM: VS:  There were no vitals taken for this visit. , BMI There is no height or weight on file to calculate BMI.  GEN: Well nourished, elderly, obese WM, in no acute distress  HEENT: normal  Neck: no JVD, carotid bruits, or masses Cardiac: RRR; no murmurs, rubs, or gallops,no edema  Respiratory:  clear to auscultation bilaterally, normal work of breathing GI: soft, nontender, nondistended, + BS MS: no deformity or atrophy  Skin: warm and dry, no rash Neuro:  Strength and sensation are intact Psych: euthymic mood, depressed affect   EKG:  EKG is not ordered today.   Recent Labs: 10/04/2021: ALT 12 02/08/2022: BUN 14; Creatinine, Ser 0.80; Hemoglobin 10.4; Platelets 248; Potassium 4.1; Sodium 135    Lipid Panel No results found for: "CHOL", "TRIG", "HDL", "CHOLHDL", "VLDL", "LDLCALC", "LDLDIRECT"    Wt Readings from Last 3 Encounters:  02/08/22 157 lb (71.2 kg)  12/29/21 160 lb 14.4 oz (73 kg)  10/04/21 154 lb (69.9 kg)      Other studies Reviewed: Additional studies/ records that were reviewed today include:   Labs dated 11/13/18: A1c 6.8%. BMET and ALT normal.   Echo 02/25/19: IMPRESSIONS      1. Left  ventricular ejection fraction, by visual estimation, is 55%. The left ventricle has normal function. Normal left ventricular size. There is no left ventricular hypertrophy. Technically difficult study, hard to comment on motion of individual  wall segments but overall EF appears normal.  2. Left ventricular diastolic Doppler parameters are consistent with impaired relaxation pattern of LV  diastolic filling.  3. The tricuspid valve is normal in structure. Tricuspid valve regurgitation is trivial.  4. The aortic valve was not well visualized Aortic valve regurgitation was not visualized by color flow Doppler. Mild aortic valve sclerosis without stenosis.  5. There is mild dilatation of the aortic root measuring 37 mm.  6. Global right ventricle has normal systolic function.The right ventricular size is normal. No increase in right ventricular wall thickness.  7. Left atrial size was normal.  8. Right atrial size was normal.  9. The mitral valve is normal in structure. Trace mitral valve regurgitation. No evidence of mitral stenosis. 10. Moderate pericardial effusion. The effusion was primarily in the apical area and laterally. No evidence for tamponade. 11. The inferior vena cava is normal in size with greater than 50% respiratory variability, suggesting right atrial pressure of 3 mmHg. 12. The tricuspid regurgitant velocity is 2.17 m/s, and with an assumed right atrial pressure of 3 mmHg, the estimated right ventricular systolic pressure is normal at 21.8 mmHg.   Event monitor 03/31/19: Study Highlights  Normal sinus rhythm Occasional PVCs, couplets and brief runs of NSVT 4-8 beats. Occasional PACs with brief runs of PAT/SVT. longest 20 seconds at rate 152. No symptoms recorded  ASSESSMENT AND PLAN:  1.  Syncope. No warning symptoms. Event unwitnessed. Concerning for potential arrhythmia. Not previously orthostatic.  Labs and Echo unremarkable. Will await results of monitor. If no significant  arrhythmia will watch and wait.   2. HTN well controlled.    Current medicines are reviewed at length with the patient today.  The patient does not have concerns regarding medicines.  The following changes have been made:  no change  Labs/ tests ordered today include:   No orders of the defined types were placed in this encounter.    Disposition:   FU after above studies. Signed, Austina Constantin [place], MD  03/17/2022 3:13 PM    [facility] [address], [place], [place], [address] Phone [phone], Fax [phone]

## 2022-03-31 ENCOUNTER — Ambulatory Visit: Payer: Medicare Other | Admitting: Cardiology

## 2022-07-16 ENCOUNTER — Inpatient Hospital Stay (HOSPITAL_COMMUNITY)
Admission: EM | Admit: 2022-07-16 | Discharge: 2022-07-18 | DRG: 194 | Disposition: A | Discharge status: Skilled Nursing Facility | Payer: Medicare Other | Attending: Internal Medicine | Admitting: Internal Medicine

## 2022-07-16 ENCOUNTER — Encounter (HOSPITAL_COMMUNITY): Payer: Self-pay

## 2022-07-16 ENCOUNTER — Emergency Department (HOSPITAL_COMMUNITY): Payer: Medicare Other

## 2022-07-16 ENCOUNTER — Other Ambulatory Visit: Payer: Self-pay

## 2022-07-16 DIAGNOSIS — J9811 Atelectasis: Secondary | ICD-10-CM | POA: Diagnosis present

## 2022-07-16 DIAGNOSIS — R053 Chronic cough: Secondary | ICD-10-CM | POA: Diagnosis present

## 2022-07-16 DIAGNOSIS — F411 Generalized anxiety disorder: Secondary | ICD-10-CM | POA: Diagnosis present

## 2022-07-16 DIAGNOSIS — I10 Essential (primary) hypertension: Secondary | ICD-10-CM | POA: Diagnosis present

## 2022-07-16 DIAGNOSIS — Z8249 Family history of ischemic heart disease and other diseases of the circulatory system: Secondary | ICD-10-CM

## 2022-07-16 DIAGNOSIS — K2100 Gastro-esophageal reflux disease with esophagitis, without bleeding: Secondary | ICD-10-CM | POA: Diagnosis present

## 2022-07-16 DIAGNOSIS — R5381 Other malaise: Secondary | ICD-10-CM | POA: Diagnosis present

## 2022-07-16 DIAGNOSIS — Z8673 Personal history of transient ischemic attack (TIA), and cerebral infarction without residual deficits: Secondary | ICD-10-CM | POA: Diagnosis not present

## 2022-07-16 DIAGNOSIS — J189 Pneumonia, unspecified organism: Secondary | ICD-10-CM | POA: Diagnosis not present

## 2022-07-16 DIAGNOSIS — G4700 Insomnia, unspecified: Secondary | ICD-10-CM | POA: Diagnosis present

## 2022-07-16 DIAGNOSIS — E1169 Type 2 diabetes mellitus with other specified complication: Secondary | ICD-10-CM | POA: Diagnosis present

## 2022-07-16 DIAGNOSIS — Z1152 Encounter for screening for COVID-19: Secondary | ICD-10-CM

## 2022-07-16 DIAGNOSIS — N400 Enlarged prostate without lower urinary tract symptoms: Secondary | ICD-10-CM | POA: Diagnosis present

## 2022-07-16 DIAGNOSIS — H409 Unspecified glaucoma: Secondary | ICD-10-CM | POA: Diagnosis present

## 2022-07-16 DIAGNOSIS — K449 Diaphragmatic hernia without obstruction or gangrene: Secondary | ICD-10-CM | POA: Diagnosis present

## 2022-07-16 DIAGNOSIS — Z87891 Personal history of nicotine dependence: Secondary | ICD-10-CM | POA: Diagnosis not present

## 2022-07-16 DIAGNOSIS — R296 Repeated falls: Secondary | ICD-10-CM | POA: Insufficient documentation

## 2022-07-16 DIAGNOSIS — G40909 Epilepsy, unspecified, not intractable, without status epilepticus: Secondary | ICD-10-CM | POA: Diagnosis present

## 2022-07-16 DIAGNOSIS — F5101 Primary insomnia: Secondary | ICD-10-CM | POA: Diagnosis present

## 2022-07-16 DIAGNOSIS — Z7984 Long term (current) use of oral hypoglycemic drugs: Secondary | ICD-10-CM | POA: Diagnosis not present

## 2022-07-16 DIAGNOSIS — Z9841 Cataract extraction status, right eye: Secondary | ICD-10-CM

## 2022-07-16 DIAGNOSIS — Z9842 Cataract extraction status, left eye: Secondary | ICD-10-CM

## 2022-07-16 DIAGNOSIS — K5900 Constipation, unspecified: Secondary | ICD-10-CM | POA: Diagnosis present

## 2022-07-16 DIAGNOSIS — Z79899 Other long term (current) drug therapy: Secondary | ICD-10-CM

## 2022-07-16 DIAGNOSIS — J90 Pleural effusion, not elsewhere classified: Secondary | ICD-10-CM | POA: Diagnosis present

## 2022-07-16 DIAGNOSIS — E1140 Type 2 diabetes mellitus with diabetic neuropathy, unspecified: Secondary | ICD-10-CM | POA: Diagnosis present

## 2022-07-16 DIAGNOSIS — R413 Other amnesia: Secondary | ICD-10-CM | POA: Diagnosis present

## 2022-07-16 DIAGNOSIS — R569 Unspecified convulsions: Secondary | ICD-10-CM

## 2022-07-16 DIAGNOSIS — Z66 Do not resuscitate: Secondary | ICD-10-CM | POA: Diagnosis present

## 2022-07-16 DIAGNOSIS — R1030 Lower abdominal pain, unspecified: Secondary | ICD-10-CM | POA: Insufficient documentation

## 2022-07-16 HISTORY — DX: Unspecified convulsions: R56.9

## 2022-07-16 LAB — CBC WITH DIFFERENTIAL/PLATELET
Abs Immature Granulocytes: 0.05 10*3/uL (ref 0.00–0.07)
Basophils Absolute: 0.1 10*3/uL (ref 0.0–0.1)
Basophils Relative: 0 %
Eosinophils Absolute: 0.1 10*3/uL (ref 0.0–0.5)
Eosinophils Relative: 1 %
HCT: 30 % — ABNORMAL LOW (ref 39.0–52.0)
Hemoglobin: 9.7 g/dL — ABNORMAL LOW (ref 13.0–17.0)
Immature Granulocytes: 0 %
Lymphocytes Relative: 19 %
Lymphs Abs: 2.5 10*3/uL (ref 0.7–4.0)
MCH: 30.8 pg (ref 26.0–34.0)
MCHC: 32.3 g/dL (ref 30.0–36.0)
MCV: 95.2 fL (ref 80.0–100.0)
Monocytes Absolute: 0.9 10*3/uL (ref 0.1–1.0)
Monocytes Relative: 7 %
Neutro Abs: 9.7 10*3/uL — ABNORMAL HIGH (ref 1.7–7.7)
Neutrophils Relative %: 73 %
Platelets: 507 10*3/uL — ABNORMAL HIGH (ref 150–400)
RBC: 3.15 MIL/uL — ABNORMAL LOW (ref 4.22–5.81)
RDW: 12.2 % (ref 11.5–15.5)
WBC: 13.2 10*3/uL — ABNORMAL HIGH (ref 4.0–10.5)
nRBC: 0 % (ref 0.0–0.2)

## 2022-07-16 LAB — COMPREHENSIVE METABOLIC PANEL
ALT: 11 U/L (ref 0–44)
AST: 17 U/L (ref 15–41)
Albumin: 2.7 g/dL — ABNORMAL LOW (ref 3.5–5.0)
Alkaline Phosphatase: 138 U/L — ABNORMAL HIGH (ref 38–126)
Anion gap: 11 (ref 5–15)
BUN: 15 mg/dL (ref 8–23)
CO2: 25 mmol/L (ref 22–32)
Calcium: 8.6 mg/dL — ABNORMAL LOW (ref 8.9–10.3)
Chloride: 98 mmol/L (ref 98–111)
Creatinine, Ser: 1.01 mg/dL (ref 0.61–1.24)
GFR, Estimated: >60 mL/min (ref >60)
Glucose, Bld: 158 mg/dL — ABNORMAL HIGH (ref 70–99)
Potassium: 4.3 mmol/L (ref 3.5–5.1)
Sodium: 134 mmol/L — ABNORMAL LOW (ref 135–145)
Total Bilirubin: 0.7 mg/dL (ref 0.3–1.2)
Total Protein: 6.8 g/dL (ref 6.5–8.1)

## 2022-07-16 LAB — LIPASE, BLOOD: Lipase: 39 U/L (ref 11–51)

## 2022-07-16 LAB — LACTIC ACID, PLASMA: Lactic Acid, Venous: 1.3 mmol/L (ref 0.5–1.9)

## 2022-07-16 LAB — RESP PANEL BY RT-PCR (RSV, FLU A&B, COVID)  RVPGX2
Influenza A by PCR: NEGATIVE
Influenza B by PCR: NEGATIVE
Resp Syncytial Virus by PCR: POSITIVE — AB
SARS Coronavirus 2 by RT PCR: NEGATIVE

## 2022-07-16 LAB — TROPONIN I (HIGH SENSITIVITY)
Troponin I (High Sensitivity): 7 ng/L (ref <18)
Troponin I (High Sensitivity): 9 ng/L (ref <18)

## 2022-07-16 LAB — BRAIN NATRIURETIC PEPTIDE: B Natriuretic Peptide: 120.2 pg/mL — ABNORMAL HIGH (ref 0.0–100.0)

## 2022-07-16 LAB — GLUCOSE, CAPILLARY: Glucose-Capillary: 152 mg/dL — ABNORMAL HIGH (ref 70–99)

## 2022-07-16 MED ORDER — TEMAZEPAM 15 MG PO CAPS
15.0000 mg | ORAL_CAPSULE | Freq: Every day | ORAL | Status: DC
  Administered 2022-07-16 – 2022-07-17 (×2): 15 mg via ORAL
  Filled 2022-07-16 (×2): qty 1

## 2022-07-16 MED ORDER — ESCITALOPRAM OXALATE 10 MG PO TABS
20.0000 mg | ORAL_TABLET | Freq: Every morning | ORAL | Status: DC
  Administered 2022-07-17 – 2022-07-18 (×2): 20 mg via ORAL
  Filled 2022-07-16 (×2): qty 2

## 2022-07-16 MED ORDER — SODIUM CHLORIDE 0.9 % IV SOLN: 1.0000 g | Freq: Once | INTRAVENOUS | Status: DC

## 2022-07-16 MED ORDER — SODIUM CHLORIDE 0.9 % IV SOLN
2.0000 g | INTRAVENOUS | Status: DC
  Administered 2022-07-17 – 2022-07-18 (×2): 2 g via INTRAVENOUS
  Filled 2022-07-16 (×2): qty 20

## 2022-07-16 MED ORDER — LITHIUM CARBONATE 150 MG PO CAPS
150.0000 mg | ORAL_CAPSULE | Freq: Every day | ORAL | Status: DC
  Administered 2022-07-16 – 2022-07-17 (×2): 150 mg via ORAL
  Filled 2022-07-16 (×2): qty 1

## 2022-07-16 MED ORDER — FINASTERIDE 5 MG PO TABS
5.0000 mg | ORAL_TABLET | Freq: Every morning | ORAL | Status: DC
  Administered 2022-07-17 – 2022-07-18 (×2): 5 mg via ORAL
  Filled 2022-07-16 (×2): qty 1

## 2022-07-16 MED ORDER — VITAMIN B-12 100 MCG PO TABS
100.0000 ug | ORAL_TABLET | Freq: Every morning | ORAL | Status: DC
  Administered 2022-07-17 – 2022-07-18 (×2): 100 ug via ORAL
  Filled 2022-07-16 (×2): qty 1

## 2022-07-16 MED ORDER — MIRABEGRON ER 25 MG PO TB24
25.0000 mg | ORAL_TABLET | Freq: Every day | ORAL | Status: DC
  Administered 2022-07-17 – 2022-07-18 (×2): 25 mg via ORAL
  Filled 2022-07-16 (×2): qty 1

## 2022-07-16 MED ORDER — FERROUS SULFATE 325 (65 FE) MG PO TABS
325.0000 mg | ORAL_TABLET | Freq: Every day | ORAL | Status: DC
  Administered 2022-07-17 – 2022-07-18 (×2): 325 mg via ORAL
  Filled 2022-07-16 (×2): qty 1

## 2022-07-16 MED ORDER — TRAZODONE HCL 100 MG PO TABS
100.0000 mg | ORAL_TABLET | Freq: Every day | ORAL | Status: DC
  Administered 2022-07-16 – 2022-07-17 (×2): 100 mg via ORAL
  Filled 2022-07-16 (×2): qty 1

## 2022-07-16 MED ORDER — IPRATROPIUM-ALBUTEROL 0.5-2.5 (3) MG/3ML IN SOLN
2.0000 mL | Freq: Four times a day (QID) | RESPIRATORY_TRACT | Status: DC
  Administered 2022-07-16 – 2022-07-17 (×2): 2 mL via RESPIRATORY_TRACT
  Filled 2022-07-16 (×2): qty 3

## 2022-07-16 MED ORDER — VITAMIN D 25 MCG (1000 UNIT) PO TABS
2000.0000 [IU] | ORAL_TABLET | Freq: Every morning | ORAL | Status: DC
  Administered 2022-07-17 – 2022-07-18 (×2): 2000 [IU] via ORAL
  Filled 2022-07-16 (×2): qty 2

## 2022-07-16 MED ORDER — IOHEXOL 350 MG/ML SOLN
100.0000 mL | Freq: Once | INTRAVENOUS | Status: AC | PRN
  Administered 2022-07-16: 100 mL via INTRAVENOUS

## 2022-07-16 MED ORDER — METFORMIN HCL 500 MG PO TABS: 500.0000 mg | ORAL_TABLET | Freq: Two times a day (BID) | ORAL | Status: DC

## 2022-07-16 MED ORDER — ONDANSETRON HCL 4 MG PO TABS: 4.0000 mg | ORAL_TABLET | Freq: Four times a day (QID) | ORAL | Status: DC | PRN

## 2022-07-16 MED ORDER — FUROSEMIDE 40 MG PO TABS
20.0000 mg | ORAL_TABLET | Freq: Every day | ORAL | Status: DC
  Administered 2022-07-17 – 2022-07-18 (×2): 20 mg via ORAL
  Filled 2022-07-16 (×2): qty 1

## 2022-07-16 MED ORDER — ACETAMINOPHEN 325 MG PO TABS: 650.0000 mg | ORAL_TABLET | Freq: Four times a day (QID) | ORAL | Status: DC | PRN

## 2022-07-16 MED ORDER — VANCOMYCIN HCL 1500 MG/300ML IV SOLN
1500.0000 mg | Freq: Once | INTRAVENOUS | Status: AC
  Administered 2022-07-16: 1500 mg via INTRAVENOUS
  Filled 2022-07-16: qty 300

## 2022-07-16 MED ORDER — SODIUM CHLORIDE 0.9 % IV SOLN
500.0000 mg | INTRAVENOUS | Status: DC
  Administered 2022-07-17: 500 mg via INTRAVENOUS
  Filled 2022-07-16: qty 5

## 2022-07-16 MED ORDER — LORAZEPAM 1 MG PO TABS
1.0000 mg | ORAL_TABLET | Freq: Two times a day (BID) | ORAL | Status: DC
  Administered 2022-07-17 – 2022-07-18 (×3): 1 mg via ORAL
  Filled 2022-07-16 (×3): qty 1

## 2022-07-16 MED ORDER — TIMOLOL MALEATE 0.5 % OP SOLN
1.0000 [drp] | Freq: Every evening | OPHTHALMIC | Status: DC
  Administered 2022-07-16 – 2022-07-17 (×2): 1 [drp] via OPHTHALMIC
  Filled 2022-07-16: qty 5

## 2022-07-16 MED ORDER — ONDANSETRON HCL 4 MG/2ML IJ SOLN: 4.0000 mg | Freq: Four times a day (QID) | INTRAMUSCULAR | Status: DC | PRN

## 2022-07-16 MED ORDER — SODIUM CHLORIDE 0.9 % IV SOLN
2.0000 g | Freq: Once | INTRAVENOUS | Status: AC
  Administered 2022-07-16: 2 g via INTRAVENOUS
  Filled 2022-07-16: qty 12.5

## 2022-07-16 MED ORDER — ACETAMINOPHEN 650 MG RE SUPP: 650.0000 mg | Freq: Four times a day (QID) | RECTAL | Status: DC | PRN

## 2022-07-16 MED ORDER — LACTOBACILLUS PO TABS: 3.0000 | ORAL_TABLET | Freq: Two times a day (BID) | ORAL | Status: DC

## 2022-07-16 MED ORDER — TAMSULOSIN HCL 0.4 MG PO CAPS
0.4000 mg | ORAL_CAPSULE | Freq: Every evening | ORAL | Status: DC
  Administered 2022-07-16 – 2022-07-17 (×2): 0.4 mg via ORAL
  Filled 2022-07-16 (×2): qty 1

## 2022-07-16 MED ORDER — DOCUSATE SODIUM 100 MG PO CAPS
100.0000 mg | ORAL_CAPSULE | Freq: Two times a day (BID) | ORAL | Status: DC
  Administered 2022-07-16 – 2022-07-18 (×4): 100 mg via ORAL
  Filled 2022-07-16 (×4): qty 1

## 2022-07-16 MED ORDER — ZINC OXIDE 20 % EX OINT: 1.0000 | TOPICAL_OINTMENT | CUTANEOUS | Status: DC | PRN

## 2022-07-16 MED ORDER — INSULIN ASPART 100 UNIT/ML IJ SOLN
0.0000 [IU] | Freq: Three times a day (TID) | INTRAMUSCULAR | Status: DC
  Administered 2022-07-17: 3 [IU] via SUBCUTANEOUS
  Administered 2022-07-18 (×2): 1 [IU] via SUBCUTANEOUS

## 2022-07-16 MED ORDER — POLYETHYLENE GLYCOL 3350 17 G PO PACK
17.0000 g | PACK | Freq: Every day | ORAL | Status: DC | PRN
  Administered 2022-07-17: 17 g via ORAL
  Filled 2022-07-16: qty 1

## 2022-07-16 MED ORDER — LATANOPROST 0.005 % OP SOLN
1.0000 [drp] | Freq: Every evening | OPHTHALMIC | Status: DC
  Administered 2022-07-16 – 2022-07-17 (×2): 1 [drp] via OPHTHALMIC
  Filled 2022-07-16: qty 2.5

## 2022-07-16 MED ORDER — DICLOFENAC SODIUM 1 % EX GEL
2.0000 g | Freq: Two times a day (BID) | CUTANEOUS | Status: DC
  Administered 2022-07-16 – 2022-07-18 (×4): 2 g via TOPICAL
  Filled 2022-07-16: qty 100

## 2022-07-16 NOTE — ED Provider Notes (Signed)
[place] [facility] Provider Note   CSN: [id number] Arrival date & time: 07/16/22  1232     History  Chief Complaint  Patient presents with   Chest Pain    Russell Ayers is a 89 y.o. male.  Patient with a history of previous stroke, hypertension, diabetes, anxiety disorder with recent diagnosis of RSV presenting with left upper abdominal pain and rib pain.  Pain acutely onset last night.  No fall or trauma.  EMS states he was diagnosed with RSV about a week ago but has cleared isolation precautions and has had a chronic nonproductive cough since.  Denies feeling short of breath.  Woke up with left upper abdominal pain and lower rib pain since last night that is constant and worse with palpation and movement.  Denies any fevers, chills, nausea, vomiting, diarrhea.  Not able to cough up anything.  No history of asthma or COPD.  Pain is in his left lower ribs and upper abdomen as well as not radiate.  The history is provided by the patient and the EMS personnel.  Chest Pain Associated symptoms: abdominal pain, cough, nausea and shortness of breath   Associated symptoms: no dizziness, no fever, no headache, no vomiting and no weakness        Home Medications Prior to Admission medications   Medication Sig Start Date End Date Taking? Authorizing Provider  acetaminophen (TYLENOL) 500 MG tablet Take 1,000 mg by mouth 3 (three) times daily.    [provider]  Cholecalciferol (VITAMIN D3) 50 MCG (2000 UT) TABS Take 2,000 Units by mouth every morning.    [provider]  diclofenac Sodium (VOLTAREN) 1 % GEL Apply 2 g topically in the morning and at bedtime. To right foot toes, left hip and both knees.    [provider]  docusate sodium (COLACE) 100 MG capsule Take 100 mg by mouth 2 (two) times daily.    [provider]  escitalopram (LEXAPRO) 20 MG tablet Take 20 mg by mouth every morning. 09/26/21   [provider]  ferrous sulfate 325 (65 FE) MG tablet Take 325 mg by mouth daily.    [provider]  finasteride (PROSCAR) 5 MG tablet Take 5 mg by mouth every morning. 09/30/18   [provider]  furosemide (LASIX) 20 MG tablet Take 20 mg by mouth daily. 12/21/21   [provider]  LACTOBACILLUS PO Take 3 capsules by mouth 2 (two) times daily before a meal.    [provider]  latanoprost (XALATAN) 0.005 % ophthalmic solution Place 1 drop into both eyes every evening. 11/20/18   [provider]  lithium carbonate 150 MG capsule Take 150 mg by mouth at bedtime.    [provider]  LORazepam (ATIVAN) 1 MG tablet Take 1 mg by mouth 2 (two) times daily. 09/24/21   [provider]  metFORMIN (GLUCOPHAGE) 500 MG tablet Take 500 mg by mouth 2 (two) times daily. 09/28/21   [provider]  mirabegron ER (MYRBETRIQ) 25 MG TB24 tablet Take 25 mg by mouth in the morning.    [provider]  Misc Natural Products (PROSTATE THERAPY COMPLEX) CAPS Take 1 capsule by mouth every evening. Nettle-pumpkin-saw palm-min 17)    [provider]  nitrofurantoin, macrocrystal-monohydrate, (MACROBID) 100 MG capsule Take 100 mg by mouth 2 (two) times daily.    [provider]  polyethylene glycol (MIRALAX / GLYCOLAX) 17 g packet Take 17 g by  mouth daily as needed for mild constipation. 02/08/21   Shelly Coss, MD  tamsulosin (FLOMAX) 0.4 MG CAPS capsule Take 0.4 mg by mouth every evening. 11/25/20   [provider]  temazepam (RESTORIL) 15 MG capsule Take 15 mg by mouth at bedtime. 09/19/21   [provider]  timolol (TIMOPTIC) 0.5 % ophthalmic solution Place 1 drop into both eyes every evening. 08/27/21   [provider]  traZODone (DESYREL) 100 MG tablet Take 100 mg by mouth at bedtime.    [provider]  vitamin B-12 (CYANOCOBALAMIN) 100 MCG tablet Take 100 mcg by mouth every morning.    [provider]  zinc oxide 20 % ointment Apply 1 application. topically See admin instructions. Apply topically to buttocks every shift    [provider]      Allergies    Patient has no known allergies.    Review of Systems   Review of Systems  Constitutional:  Negative for activity change, appetite change and fever.  HENT:  Negative for congestion and rhinorrhea.   Respiratory:  Positive for cough, chest tightness and shortness of breath.   Cardiovascular:  Positive for chest pain.  Gastrointestinal:  Positive for abdominal pain and nausea. Negative for vomiting.  Genitourinary:  Negative for dysuria and hematuria.  Musculoskeletal:  Negative for arthralgias and myalgias.  Skin:  Negative for rash.  Neurological:  Negative for dizziness, weakness and headaches.   all other systems are negative except as noted in the HPI and PMH.    Physical Exam Updated Vital Signs BP 128/65   Pulse 60   Temp 98.7 F (37.1 C)   Resp 20   Ht '5\' 11"'$  (1.803 m)   Wt 71.2 kg   SpO2 96%   BMI 21.89 kg/m  Physical Exam Vitals and nursing note reviewed.  Constitutional:      General: He is not in acute distress.    Appearance: He is well-developed.  HENT:     Head: Normocephalic and atraumatic.     Mouth/Throat:     Pharynx: No oropharyngeal exudate.  Eyes:     Conjunctiva/sclera: Conjunctivae normal.     Pupils: Pupils are equal, round, and reactive to light.  Neck:     Comments: No meningismus. Cardiovascular:     Rate and Rhythm: Normal rate and regular rhythm.     Heart sounds: Normal heart sounds. No murmur heard. Pulmonary:     Effort: Pulmonary effort is normal. No respiratory distress.     Breath sounds: Normal breath sounds.     Comments: Tenderness to left lower lateral ribs without crepitus or ecchymosis Chest:     Chest wall: Tenderness present.  Abdominal:     Palpations: Abdomen is soft.     Tenderness: There is abdominal tenderness. There is no guarding or  rebound.     Comments: Left upper quadrant tenderness, no guarding or rebound.  Genitourinary:    Comments: Foley catheter in place Musculoskeletal:        General: No tenderness. Normal range of motion.     Cervical back: Normal range of motion and neck supple.  Skin:    General: Skin is warm.  Neurological:     Mental Status: He is alert and oriented to person, place, and time.     Cranial Nerves: No cranial nerve deficit.     Motor: No abnormal muscle tone.     Coordination: Coordination normal.     Comments:  5/5 strength throughout.  CN 2-12 intact.Equal grip strength.   Psychiatric:        Behavior: Behavior normal.     ED Results / Procedures / Treatments   Labs (all labs ordered are listed, but only abnormal results are displayed) Labs Reviewed  CBC WITH DIFFERENTIAL/PLATELET - Abnormal; Notable for the following components:      Result Value   WBC 13.2 (*)    RBC 3.15 (*)    Hemoglobin 9.7 (*)    HCT 30.0 (*)    Platelets 507 (*)    Neutro Abs 9.7 (*)    All other components within normal limits  COMPREHENSIVE METABOLIC PANEL - Abnormal; Notable for the following components:   Sodium 134 (*)    Glucose, Bld 158 (*)    Calcium 8.6 (*)    Albumin 2.7 (*)    Alkaline Phosphatase 138 (*)    All other components within normal limits  BRAIN NATRIURETIC PEPTIDE - Abnormal; Notable for the following components:   B Natriuretic Peptide 120.2 (*)    All other components within normal limits  CULTURE, BLOOD (ROUTINE X 2)  CULTURE, BLOOD (ROUTINE X 2)  LIPASE, BLOOD  LACTIC ACID, PLASMA  LACTIC ACID, PLASMA  TROPONIN I (HIGH SENSITIVITY)  TROPONIN I (HIGH SENSITIVITY)    EKG EKG Interpretation  Date/Time:  Sunday July 16 2022 13:00:45 EST Ventricular Rate:  100 PR Interval:  150 QRS Duration: 86 QT Interval:  352 QTC Calculation: 454 R Axis:   -42 Text Interpretation: Sinus tachycardia Multiform ventricular premature complexes Left anterior fascicular  block Abnormal R-wave progression, late transition No significant change was found Confirmed by Ezequiel Essex [phone]) on 07/16/2022 1:27:19 PM he can  Radiology DG Chest Portable 1 View  Result Date: 07/16/2022 CLINICAL DATA:  Shortness of breath and chest pain. EXAM: PORTABLE CHEST 1 VIEW COMPARISON:  None Available. FINDINGS: The cardiomediastinal silhouette is unremarkable. LEFT LOWER lung opacity is noted and may represent atelectasis or airspace disease. Mild RIGHT basilar atelectasis is present. There is no evidence of pneumothorax or large pleural effusion. No acute bony abnormalities are present. IMPRESSION: LEFT LOWER lung opacity which may represent atelectasis or airspace disease/pneumonia. Radiographic follow-up to resolution is recommended. Mild RIGHT basilar atelectasis. Electronically Signed   By: Margarette Canada M.D.   On: 07/16/2022 13:40    Procedures Procedures    Medications Ordered in ED Medications - No data to display  ED Course/ Medical Decision Making/ A&P                             Medical Decision Making Amount and/or Complexity of Data Reviewed Independent Historian: EMS Labs: ordered. Decision-making details documented in ED Course. Radiology: ordered and independent interpretation performed. Decision-making details documented in ED Course. ECG/medicine tests: ordered and independent interpretation performed. Decision-making details documented in ED Course.  Risk Prescription drug management.  Left upper abdominal pain since last night.  Vital stable, no distress.  No hypoxia or increased work of breathing.  EKG shows no acute ST changes.  Chest x-ray shows no rib fracture but does show concern for left basilar pneumonia.  Results reviewed interpreted by me.  This may explain his upper abdominal pain and rib pain.  No hypoxia or increased work of breathing. Labs show leukocytosis.  Lactate and blood cultures will be added on.  No wheezing on exam.  Given  patient's immobility and recent URI type symptoms will obtain CT scan to rule  out pulmonary embolism as explanation for his pleuritic rib pain.  Will likely require admission for his pneumonia which may be responsible for this pain as well.  Broad-spectrum antibiotics initiated.  CT pending at shift change.  Oncoming team to admit patient to hospital for healthcare associated pneumonia.        Final Clinical Impression(s) / ED Diagnoses Final diagnoses:  None    Rx / DC Orders ED Discharge Orders     None         Kelly Eisler, Annie Main, MD 07/16/22 1549

## 2022-07-16 NOTE — H&P (Signed)
History and Physical    Patient: Russell Ayers [id number] DOB: 12-21-1932 DOA: 07/16/2022 DOS: the patient was seen and examined on 07/16/2022 PCP: System, Provider Not In  Patient coming from: Home  Chief Complaint:  Chief Complaint  Patient presents with   Chest Pain   HPI: Russell Ayers is a 89 y.o. male with medical history significant of anemia, osteoarthritis, constipation, generalized anxiety disorder, glaucoma, nephrolithiasis, hypertension, inguinal hernia, insomnia, memory impairment, history of pneumonia, prostatism, history of left hip fracture, small bowel obstruction, seizure disorder, history of CVA, type 2 diabetes who was brought via EMS from [facility] due to left lower chest wall pain.  No history of trauma in the area.  He was diagnosed with RSV about a week ago and has been having a persistent cough since then.  He cleared isolation few days ago. According to his daughter, he has been having a mostly nonproductive cough, but occasionally he has been coughing beige colored sputum, particularly in the last couple of days.  She was called by the facility who told her that he was having left lower chest wall and left upper quadrant pain since last night.  The pain gets worse with deep inspiration.  He denied having dyspnea, headache, rhinorrhea, sore throat, precordial chest or back pain at the time of my examination.  No diarrhea, melena or hematochezia, but gets occasionally constipated.  He has a Foley catheter.  ED course: Initial vital signs were temperature 98.7 F, pulse 94, respiration 18, BP 140/99 mmHg O2 sat 97% on room air.  He received cefepime and vancomycin.  Lab work: His CBCs are white count of 13.2, hemoglobin 9.7 g/dL platelets 507.  Troponin x 2 normal.  BNP 120.2 pg/mL.  Lipase and lactic acid were normal.  CMP showed a glucose of 158 mg/dL, albumin of 2.7 g/dL and alkaline phosphatase of 138 units/L.  Sodium and calcium normalized after  correction.  The rest of the CMP measurements were normal.  Imaging: Portable 1 view chest radiograph showing left lower lung opacity which may represent atelectasis or airspace disease/pneumonia.  Radiographic follow-up recommended.  Mild right basilar atelectasis.  CTA chest with no PE.  There is retained mucus and/or debris within the left mainstem bronchus with a total feeling of the left lower lobe bronchi.  Moderate size left lower lobe airspace consolidation.  Small to moderate sized left pleural effusion, partially loculated tracking into the fissure.  Segmental bronchial feeling in the right lower lobe.  Peripheral/dependent airspace disease in the right lower lobe with faint tree-in-bud opacities.  Overall findings may represent infection or aspiration sequela.  CT abdomen/pelvis with contrast showed a moderate size hiatal hernia.  Mild irregular wall thickening of the herniated stomach in the lower thorax, suspicious for gastritis.  Moderate diffuse bladder wall thickening, which may be due to nondistention, however recommend correlation with urinalysis to exclude UTI.  Positive diverticulosis without diverticulitis.  Aortic atherosclerosis.   Review of Systems: As mentioned in the history of present illness. All other systems reviewed and are negative.  Past Medical History:  Diagnosis Date   Anemia    Arthritis    Constipation 07/16/2022   Generalized anxiety disorder    Glaucoma    History of kidney stones    Hypertension    Inguinal hernia    Insomnia    Memory impairment 07/16/2022   Pneumonia    July 2023   Primary insomnia 07/16/2022   Prostatism    S/p left hip  fracture 02/04/2021   SBO (small bowel obstruction) ([place]) 07/26/2020   Seizure ([place])    Stroke [facility])    Type 2 diabetes mellitus with other specified complication ([place]) [id number]   Past Surgical History:  Procedure Laterality Date   CATARACT EXTRACTION, BILATERAL     FEMUR IM NAIL Left 02/05/2021    Procedure: INTRAMEDULLARY (IM) NAIL FEMORAL;  Surgeon: Renette Butters, MD;  Location: [facility] ORS;  Service: Orthopedics;  Laterality: Left;   glaucoma     HIP SURGERY Left    INGUINAL HERNIA REPAIR Left 02/08/2022   Procedure: LEFT HERNIA REPAIR INGUINAL ADULT;  Surgeon: Clovis Riley, MD;  Location: [facility] ORS;  Service: General;  Laterality: Left;   Social History:  reports that he quit smoking about 33 years ago. His smoking use included cigarettes. He has never used smokeless tobacco. He reports that he does not drink alcohol and does not use drugs.  No Known Allergies  Family History  Problem Relation Age of Onset   Cancer Brother    Heart attack Brother     Prior to Admission medications   Medication Sig Start Date End Date Taking? Authorizing Provider  acetaminophen (TYLENOL) 500 MG tablet Take 1,000 mg by mouth 3 (three) times daily.    [provider]  Cholecalciferol (VITAMIN D3) 50 MCG (2000 UT) TABS Take 2,000 Units by mouth every morning.    [provider]  diclofenac Sodium (VOLTAREN) 1 % GEL Apply 2 g topically in the morning and at bedtime. To right foot toes, left hip and both knees.    [provider]  docusate sodium (COLACE) 100 MG capsule Take 100 mg by mouth 2 (two) times daily.    [provider]  escitalopram (LEXAPRO) 20 MG tablet Take 20 mg by mouth every morning. 09/26/21   [provider]  ferrous sulfate 325 (65 FE) MG tablet Take 325 mg by mouth daily.    [provider]  finasteride (PROSCAR) 5 MG tablet Take 5 mg by mouth every morning. 09/30/18   [provider]  furosemide (LASIX) 20 MG tablet Take 20 mg by mouth daily. 12/21/21   [provider]  LACTOBACILLUS PO Take 3 capsules by mouth 2 (two) times daily before a meal.    [provider]  latanoprost (XALATAN) 0.005 % ophthalmic solution Place 1 drop into both eyes every evening. 11/20/18   [provider]  lithium  carbonate 150 MG capsule Take 150 mg by mouth at bedtime.    [provider]  LORazepam (ATIVAN) 1 MG tablet Take 1 mg by mouth 2 (two) times daily. 09/24/21   [provider]  metFORMIN (GLUCOPHAGE) 500 MG tablet Take 500 mg by mouth 2 (two) times daily. 09/28/21   [provider]  mirabegron ER (MYRBETRIQ) 25 MG TB24 tablet Take 25 mg by mouth in the morning.    [provider]  Misc Natural Products (PROSTATE THERAPY COMPLEX) CAPS Take 1 capsule by mouth every evening. Nettle-pumpkin-saw palm-min 17)    [provider]  nitrofurantoin, macrocrystal-monohydrate, (MACROBID) 100 MG capsule Take 100 mg by mouth 2 (two) times daily.    [provider]  polyethylene glycol (MIRALAX / GLYCOLAX) 17 g packet Take 17 g by mouth daily as needed for mild constipation. 02/08/21   Shelly Coss, MD  tamsulosin (FLOMAX) 0.4 MG CAPS capsule Take 0.4 mg by mouth every evening. 11/25/20   [provider]  temazepam (RESTORIL) 15 MG capsule Take 15  mg by mouth at bedtime. 09/19/21   [provider]  timolol (TIMOPTIC) 0.5 % ophthalmic solution Place 1 drop into both eyes every evening. 08/27/21   [provider]  traZODone (DESYREL) 100 MG tablet Take 100 mg by mouth at bedtime.    [provider]  vitamin B-12 (CYANOCOBALAMIN) 100 MCG tablet Take 100 mcg by mouth every morning.    [provider]  zinc oxide 20 % ointment Apply 1 application. topically See admin instructions. Apply topically to buttocks every shift    [provider]    Physical Exam: Vitals:   07/16/22 1257 07/16/22 1259 07/16/22 1300 07/16/22 1500  BP: (!) 140/99  128/65 116/67  Pulse: 94  60 62  Resp: 18  20 (!) 23  Temp: 98.7 F (37.1 C)     SpO2: 97%  96% 99%  Weight:  71.2 kg    Height:  '5\' 11"'$  (1.803 m)     Physical Exam Vitals and nursing note reviewed.  Constitutional:      General: He is awake. He is not in acute distress.     Appearance: He is well-developed.  HENT:     Head: Normocephalic.     Mouth/Throat:     Mouth: Mucous membranes are moist.  Eyes:     General: No scleral icterus.    Pupils: Pupils are equal, round, and reactive to light.  Neck:     Vascular: No JVD.  Cardiovascular:     Rate and Rhythm: Normal rate and regular rhythm.     Heart sounds: S1 normal and S2 normal.  Pulmonary:     Effort: No tachypnea or accessory muscle usage.     Breath sounds: Examination of the right-lower field reveals decreased breath sounds and rales. Examination of the left-lower field reveals decreased breath sounds and rales. Decreased breath sounds, rhonchi and rales present. No wheezing.  Abdominal:     General: Bowel sounds are normal. There is no distension.     Palpations: Abdomen is soft.     Tenderness: There is abdominal tenderness in the left upper quadrant. There is left CVA tenderness. There is no right CVA tenderness, guarding or rebound.  Genitourinary:    Comments: Foley catheter in place. Musculoskeletal:     Cervical back: Neck supple.     Right lower leg: No edema.     Left lower leg: No edema.  Skin:    General: Skin is warm and dry.  Neurological:     General: No focal deficit present.     Mental Status: He is alert.  Psychiatric:        Mood and Affect: Mood normal.        Behavior: Behavior is cooperative.   Data Reviewed:  Results are pending, will review when available.  Assessment and Plan: Principal Problem:   Left lower lobe pneumonia Had recent RSV infection. Questionable aspiration. Admit to PCU/inpatient. Continue supplemental oxygen. Scheduled and as needed bronchodilators. Continue ceftriaxone 1 g IVPB daily. Continue azithromycin 500 mg IVPB daily. Check strep pneumoniae urinary antigen. Check sputum Gram stain, culture and sensitivity. Follow-up blood culture and sensitivity. Follow-up CBC and chemistry in the morning.  Active Problems:   Diabetic  neuropathy (HCC) Analgesics as needed.    Type 2 diabetes mellitus with other specified complication (HCC) Carbohydrate modified diet. Continue metformin 500 mg p.o. twice daily. CBG monitoring with RI SS. Check hemoglobin A1c.    Essential hypertension Continue daily furosemide. Monitor BP, renal function  and electrolytes.    BPH (benign prostatic hyperplasia) Continue finasteride 5 mg p.o. daily. Continue Myrbetriq 25 mg p.o. in the morning. Continue tamsulosin 0.4 mg p.o. in the evening. The patient has a chronic indwelling Foley catheter. Follow-up with urology as an outpatient.    Constipation Continue Colace 100 mg p.o. twice daily. Continue MiraLAX 17 g daily as needed.    Glaucoma Continue timolol drops twice daily. Continue Xalatan drops in the evening.    Generalized anxiety disorder Continue escitalopram 20 mg p.o. daily. Continue lorazepam 1 mg p.o. twice daily. Continue lithium 150 mg p.o. bedtime.    Primary insomnia Continue temazepam 15 mg p.o. nightly. Continue trazodone.  100 mg p.o. bedtime.      Memory impairment Consider tapering off benzodiazepines. However, had seizure due to discontinuation in the past.    Seizure ([place]) Due to benzodiazepine discontinuation. No further seizures since medications restarted.   Advance Care Planning:   Code Status: DNR   Consults:   Family Communication: His daughter was at bedside.  Severity of Illness: The appropriate patient status for this patient is INPATIENT. Inpatient status is judged to be reasonable and necessary in order to provide the required intensity of service to ensure the patient's safety. The patient's presenting symptoms, physical exam findings, and initial radiographic and laboratory data in the context of their chronic comorbidities is felt to place them at high risk for further clinical deterioration. Furthermore, it is not anticipated that the patient will be medically stable for  discharge from the hospital within 2 midnights of admission.   * I certify that at the point of admission it is my clinical judgment that the patient will require inpatient hospital care spanning beyond 2 midnights from the point of admission due to high intensity of service, high risk for further deterioration and high frequency of surveillance required.*  Author: Reubin Milan, MD 07/16/2022 4:50 PM  For on call review [email or web address].   This document was prepared using Dragon voice recognition software and may contain some unintended transcription errors.

## 2022-07-16 NOTE — ED Notes (Signed)
ED TO INPATIENT HANDOFF REPORT  ED Nurse Name and Phone #: Renette Butters Name/Age/Gender Russell Ayers 89 y.o. male Room/Bed: WA03/WA03  Code Status   Code Status: Prior  Home/SNF/Other Skilled nursing facility; [name] Patient oriented to: self, place, time, and situation Is this baseline? Yes   Triage Complete: Triage complete  Chief Complaint Left lower lobe pneumonia [J18.9]  Triage Note BIB EMS from [facility] for pain in left side of rib area. Pt recently diagnosed with RSV and has had a constant cough. Denies SOB. Pt is not in distress   Allergies No Known Allergies  Level of Care/Admitting Diagnosis ED Disposition     ED Disposition  Admit   Condition  --   Comment  Hospital Area: [place] [[id number]]  Level of Care: Med-Surg [16]  May admit patient to [name] or [name] if equivalent level of care is available:: No  Covid Evaluation: Symptomatic Person Under Investigation (PUI) or recent exposure (last 10 days) *Testing Required*  Diagnosis: Left lower lobe pneumonia [place]:[id number]  Admitting Physician: Reubin Milan [id number]  Attending Physician: Reubin Milan [id number]  Certification:: I certify this patient will need inpatient services for at least 2 midnights  Estimated Length of Stay: 2          B Medical/Surgery History Past Medical History:  Diagnosis Date   Anemia    Arthritis    Constipation 07/16/2022   Generalized anxiety disorder    Glaucoma    History of kidney stones    Hypertension    Inguinal hernia    Insomnia    Memory impairment 07/16/2022   Pneumonia    July 2023   Primary insomnia 07/16/2022   Prostatism    S/p left hip fracture 02/04/2021   SBO (small bowel obstruction) ([place]) 07/26/2020   Seizure ([place])    Stroke ([place])    Type 2 diabetes mellitus with other specified complication ([place]) [id number]   Past Surgical History:  Procedure Laterality Date   CATARACT  EXTRACTION, BILATERAL     FEMUR IM NAIL Left 02/05/2021   Procedure: INTRAMEDULLARY (IM) NAIL FEMORAL;  Surgeon: Renette Butters, MD;  Location: [facility];  Service: Orthopedics;  Laterality: Left;   glaucoma     HIP SURGERY Left    INGUINAL HERNIA REPAIR Left 02/08/2022   Procedure: LEFT HERNIA REPAIR INGUINAL ADULT;  Surgeon: Clovis Riley, MD;  Location: [facility];  Service: General;  Laterality: Left;     A IV Location/Drains/Wounds Patient Lines/Drains/Airways Status     Active Line/Drains/Airways     Name Placement date Placement time Site Days   Peripheral IV 07/16/22 20 G Left Antecubital 07/16/22  1248  Antecubital  less than 1   Urethral Catheter [place] 16 Fr. 05/15/21  1059  --  427   Incision (Closed) 02/05/21 Hip Left 02/05/21  0833  -- 526   Incision (Closed) 02/08/22 Groin Left 02/08/22  0926  -- 158            Intake/Output Last 24 hours No intake or output data in the 24 hours ending 07/16/22 1645  Labs/Imaging Results for orders placed or performed during the hospital encounter of 07/16/22 (from the past 48 hour(s))  CBC with Differential     Status: Abnormal   Collection Time: 07/16/22  1:29 PM  Result Value Ref Range   WBC 13.2 (H) 4.0 - 10.5 K/uL   RBC 3.15 (L) 4.22 - 5.81 MIL/uL  Hemoglobin 9.7 (L) 13.0 - 17.0 g/dL   HCT 30.0 (L) 39.0 - 52.0 %   MCV 95.2 80.0 - 100.0 fL   MCH 30.8 26.0 - 34.0 pg   MCHC 32.3 30.0 - 36.0 g/dL   RDW 12.2 11.5 - 15.5 %   Platelets 507 (H) 150 - 400 K/uL   nRBC 0.0 0.0 - 0.2 %   Neutrophils Relative % 73 %   Neutro Abs 9.7 (H) 1.7 - 7.7 K/uL   Lymphocytes Relative 19 %   Lymphs Abs 2.5 0.7 - 4.0 K/uL   Monocytes Relative 7 %   Monocytes Absolute 0.9 0.1 - 1.0 K/uL   Eosinophils Relative 1 %   Eosinophils Absolute 0.1 0.0 - 0.5 K/uL   Basophils Relative 0 %   Basophils Absolute 0.1 0.0 - 0.1 K/uL   Immature Granulocytes 0 %   Abs Immature Granulocytes 0.05 0.00 - 0.07 K/uL    Comment: Performed at [facility], [place] [address], [place], [place] [address]  Comprehensive metabolic panel     Status: Abnormal   Collection Time: 07/16/22  1:29 PM  Result Value Ref Range   Sodium 134 (L) 135 - 145 mmol/L   Potassium 4.3 3.5 - 5.1 mmol/L   Chloride 98 98 - 111 mmol/L   CO2 25 22 - 32 mmol/L   Glucose, Bld 158 (H) 70 - 99 mg/dL    Comment: Glucose reference range applies only to samples taken after fasting for at least 8 hours.   BUN 15 8 - 23 mg/dL   Creatinine, Ser 1.01 0.61 - 1.24 mg/dL   Calcium 8.6 (L) 8.9 - 10.3 mg/dL   Total Protein 6.8 6.5 - 8.1 g/dL   Albumin 2.7 (L) 3.5 - 5.0 g/dL   AST 17 15 - 41 U/L   ALT 11 0 - 44 U/L   Alkaline Phosphatase 138 (H) 38 - 126 U/L   Total Bilirubin 0.7 0.3 - 1.2 mg/dL   GFR, Estimated >60 >60 mL/min    Comment: (NOTE) Calculated using the CKD-EPI Creatinine Equation (2021)    Anion gap 11 5 - 15    Comment: Performed at [facility], [place] [address], [place], [place] [address]  Lipase, blood     Status: None   Collection Time: 07/16/22  1:29 PM  Result Value Ref Range   Lipase 39 11 - 51 U/L    Comment: Performed at [facility], [place] [address], [place], [place] [address]  Troponin I (High Sensitivity)     Status: None   Collection Time: 07/16/22  1:29 PM  Result Value Ref Range   Troponin I (High Sensitivity) 7 <18 ng/L    Comment: (NOTE) Elevated high sensitivity troponin I (hsTnI) values and significant  changes across serial measurements may suggest ACS but many other  chronic and acute conditions are known to elevate hsTnI results.  Refer to the "Links" section for chest pain algorithms and additional  guidance. Performed at [facility], [place] [address] [place], [place] [address]   Brain natriuretic peptide     Status: Abnormal   Collection Time: 07/16/22  1:29 PM  Result Value Ref Range   B Natriuretic Peptide 120.2 (H) 0.0 - 100.0 pg/mL    Comment: Performed  at [facility], [place] [address], [place], [place] [address]   CT ABDOMEN PELVIS W CONTRAST  Result Date: 07/16/2022 CLINICAL DATA:  Abdominal pain, acute. Technologist notes  state RSV. EXAM: CT ABDOMEN AND PELVIS WITH CONTRAST TECHNIQUE: Multidetector CT imaging of the abdomen and pelvis was performed using the standard protocol following bolus administration of intravenous contrast. RADIATION DOSE REDUCTION: This exam was performed according to the departmental dose-optimization program which includes automated exposure control, adjustment of the mA and/or kV according to patient size and/or use of iterative reconstruction technique. CONTRAST:  119m OMNIPAQUE IOHEXOL 350 MG/ML SOLN COMPARISON:  CT 11/25/2021 FINDINGS: Lower chest: Assessed on concurrent chest CTA, reported separately Hepatobiliary: No focal liver abnormality is seen. No gallstones, gallbladder wall thickening, or biliary dilatation. Pancreas: Parenchymal atrophy. No ductal dilatation or inflammation. Spleen: Normal in size. Stable subcentimeter hypodensity in the anterior spleen, typically benign. Adrenals/Urinary Tract: No adrenal nodule. No hydronephrosis, perinephric edema or focal renal abnormality. Symmetric excretion on delayed phase imaging. There is a Foley catheter in the urinary bladder, however the bladder is not decompressed. There is moderate diffuse bladder wall thickening. Stomach/Bowel: Moderate-sized hiatal hernia. There is mild irregular wall thickening of the herniated stomach in the lower thorax. Fluid-filled loops of small bowel in the pelvis but no evidence of inflammation or obstruction. The appendix is normal. Moderate stool in the proximal colon. Left colonic diverticulosis without diverticulitis. Vascular/Lymphatic: Prominent aortic and branch atherosclerosis. No aortic aneurysm. Patent portal vein. No abdominopelvic adenopathy. Reproductive: Prostate is unremarkable. Other: Prior left inguinal  hernia repair. No ascites or free air. Musculoskeletal: Chronic T12 compression deformity. Postsurgical change of the left proximal femur. Scoliosis and degenerative change in the spine. No acute osseous findings. IMPRESSION: 1. Moderate-sized hiatal hernia. Mild irregular wall thickening of the herniated stomach in the lower thorax, suspicious for gastritis. 2. Moderate diffuse bladder wall thickening, which may be due to nondistention, however recommend correlation with urinalysis to exclude urinary tract infection. 3. Left colonic diverticulosis without diverticulitis. Aortic Atherosclerosis (ICD10-I70.0). Electronically Signed   By: MKeith RakeM.D.   On: 07/16/2022 16:26   CT Angio Chest PE W and/or Wo Contrast  Result Date: 07/16/2022 CLINICAL DATA:  Pulmonary embolism (PE) suspected, low to intermediate prob, positive D-dimer Technologist note states RSV EXAM: CT ANGIOGRAPHY CHEST WITH CONTRAST TECHNIQUE: Multidetector CT imaging of the chest was performed using the standard protocol during bolus administration of intravenous contrast. Multiplanar CT image reconstructions and MIPs were obtained to evaluate the vascular anatomy. Performed in conjunction with CT of the abdomen and pelvis, reported separately. RADIATION DOSE REDUCTION: This exam was performed according to the departmental dose-optimization program which includes automated exposure control, adjustment of the mA and/or kV according to patient size and/or use of iterative reconstruction technique. CONTRAST:  1056mOMNIPAQUE IOHEXOL 350 MG/ML SOLN COMPARISON:  Chest radiograph earlier today. FINDINGS: Cardiovascular: There are no filling defects within the pulmonary arteries to suggest pulmonary embolus. Moderate aortic atherosclerosis without aneurysm or acute aortic findings. The heart is upper normal in size. No pericardial effusion. Mediastinum/Nodes: Moderate-sized hiatal hernia. There is wall thickening of the distal esophagus. 11 mm  anterior paratracheal node. Mild left hilar adenopathy measuring up to 12 mm. Shotty right hilar lymph nodes. Lungs/Pleura: Retained mucus and/or debris within the left mainstem bronchus with subtotal filling of the left lower lobe bronchi. There is filling of the segmental right lower lobe bronchi. Moderate-sized left lower lobe airspace consolidation. Small to moderate size left pleural effusion, partially loculated tracking into the fissure. Peripheral/dependent airspace disease in the right lower lobe with faint tree-in-bud opacities. There is no right pleural effusion. Upper Abdomen: Assessed on concurrent abdominal CT, reported separately.  Musculoskeletal: Exaggerated thoracic kyphosis. The bones are subjectively under mineralized. Mild T12 superior endplate compression deformity is chronic and unchanged from prior abdominal CT. Review of the MIP images confirms the above findings. IMPRESSION: 1. No pulmonary embolus. 2. Retained mucus and/or debris within the left mainstem bronchus with subtotal filling of the left lower lobe bronchi. Moderate-sized left lower lobe airspace consolidation. Small to moderate size left pleural effusion, partially loculated tracking into the fissure. 3. Segmental bronchial filling in the right lower lobe. Peripheral/dependent airspace disease in the right lower lobe with faint tree-in-bud opacities. 4. Overall findings may represent infection or aspiration sequela. Recommend correlation with aspiration risk factors. 5. Moderate-sized hiatal hernia. Distal esophageal wall thickening, can be seen with reflux or esophagitis. 6. Mild mediastinal and left hilar adenopathy is likely reactive in this setting. Aortic Atherosclerosis (ICD10-I70.0). Electronically Signed   By: Keith Rake M.D.   On: 07/16/2022 16:08   DG Chest Portable 1 View  Result Date: 07/16/2022 CLINICAL DATA:  Shortness of breath and chest pain. EXAM: PORTABLE CHEST 1 VIEW COMPARISON:  None Available.  FINDINGS: The cardiomediastinal silhouette is unremarkable. LEFT LOWER lung opacity is noted and may represent atelectasis or airspace disease. Mild RIGHT basilar atelectasis is present. There is no evidence of pneumothorax or large pleural effusion. No acute bony abnormalities are present. IMPRESSION: LEFT LOWER lung opacity which may represent atelectasis or airspace disease/pneumonia. Radiographic follow-up to resolution is recommended. Mild RIGHT basilar atelectasis. Electronically Signed   By: Margarette Canada M.D.   On: 07/16/2022 13:40    Pending Labs Unresulted Labs (From admission, onward)     Start     Ordered   07/16/22 1634  Resp panel by RT-PCR (RSV, Flu A&B, Covid) Anterior Nasal Swab  (Tier 2 - SymptomaticResp panel by RT-PCR (RSV, Flu A&B, Covid))  Once,   R        07/16/22 1633   07/16/22 1346  Lactic acid, plasma  Now then every 2 hours,   R      07/16/22 1345   07/16/22 1346  Blood culture (routine x 2)  BLOOD CULTURE X 2,   R      07/16/22 1345            Vitals/Pain Today's Vitals   07/16/22 1257 07/16/22 1259 07/16/22 1300 07/16/22 1500  BP: (!) 140/99  128/65 116/67  Pulse: 94  60 62  Resp: 18  20 (!) 23  Temp: 98.7 F (37.1 C)     SpO2: 97%  96% 99%  Weight:  71.2 kg    Height:  '5\' 11"'$  (1.803 m)      Isolation Precautions Airborne and Contact precautions  Medications Medications  ceFEPIme (MAXIPIME) 2 g in sodium chloride 0.9 % 100 mL IVPB (2 g Intravenous New Bag/Given 07/16/22 1644)  vancomycin (VANCOREADY) IVPB 1500 mg/300 mL (has no administration in time range)  iohexol (OMNIPAQUE) 350 MG/ML injection 100 mL (100 mLs Intravenous Contrast Given 07/16/22 1537)    Mobility walks with device     Focused Assessments     R Recommendations: See Admitting Provider Note  Report given to:   Additional Notes:

## 2022-07-16 NOTE — ED Provider Notes (Signed)
Seen after prior EDP.  Dr. Olevia Bowens aware of case and will evaluate for admission.   Valarie Merino, MD 07/16/22 [phone]

## 2022-07-16 NOTE — Progress Notes (Signed)
PHARMACY -  BRIEF ANTIBIOTIC NOTE   Pharmacy has received consult(s) for vancomycin from an ED provider.  The patient's profile has been reviewed for ht/wt/allergies/indication/available labs.    One time order(s) placed for vancomycin 1500 mg + cefepime 2 g  Further antibiotics/pharmacy consults should be ordered by admitting physician if indicated.                       Thank you,  Tawnya Crook, PharmD, BCPS Clinical Pharmacist 07/16/2022 1:49 PM

## 2022-07-16 NOTE — ED Triage Notes (Signed)
BIB EMS from [facility] for pain in left side of rib area. Pt recently diagnosed with RSV and has had a constant cough. Denies SOB. Pt is not in distress

## 2022-07-16 NOTE — ED Notes (Signed)
Pt to ct via stretcher

## 2022-07-17 ENCOUNTER — Inpatient Hospital Stay (HOSPITAL_COMMUNITY): Payer: Medicare Other

## 2022-07-17 DIAGNOSIS — J189 Pneumonia, unspecified organism: Secondary | ICD-10-CM | POA: Diagnosis not present

## 2022-07-17 LAB — CBC
HCT: 29.9 % — ABNORMAL LOW (ref 39.0–52.0)
Hemoglobin: 9.5 g/dL — ABNORMAL LOW (ref 13.0–17.0)
MCH: 30.5 pg (ref 26.0–34.0)
MCHC: 31.8 g/dL (ref 30.0–36.0)
MCV: 96.1 fL (ref 80.0–100.0)
Platelets: 470 10*3/uL — ABNORMAL HIGH (ref 150–400)
RBC: 3.11 MIL/uL — ABNORMAL LOW (ref 4.22–5.81)
RDW: 12.1 % (ref 11.5–15.5)
WBC: 12.6 10*3/uL — ABNORMAL HIGH (ref 4.0–10.5)
nRBC: 0 % (ref 0.0–0.2)

## 2022-07-17 LAB — COMPREHENSIVE METABOLIC PANEL
ALT: 9 U/L (ref 0–44)
AST: 13 U/L — ABNORMAL LOW (ref 15–41)
Albumin: 2.5 g/dL — ABNORMAL LOW (ref 3.5–5.0)
Alkaline Phosphatase: 135 U/L — ABNORMAL HIGH (ref 38–126)
Anion gap: 8 (ref 5–15)
BUN: 15 mg/dL (ref 8–23)
CO2: 26 mmol/L (ref 22–32)
Calcium: 8.8 mg/dL — ABNORMAL LOW (ref 8.9–10.3)
Chloride: 98 mmol/L (ref 98–111)
Creatinine, Ser: 0.9 mg/dL (ref 0.61–1.24)
GFR, Estimated: >60 mL/min (ref >60)
Glucose, Bld: 117 mg/dL — ABNORMAL HIGH (ref 70–99)
Potassium: 4 mmol/L (ref 3.5–5.1)
Sodium: 132 mmol/L — ABNORMAL LOW (ref 135–145)
Total Bilirubin: 0.4 mg/dL (ref 0.3–1.2)
Total Protein: 6.9 g/dL (ref 6.5–8.1)

## 2022-07-17 LAB — GLUCOSE, CAPILLARY
Glucose-Capillary: 114 mg/dL — ABNORMAL HIGH (ref 70–99)
Glucose-Capillary: 206 mg/dL — ABNORMAL HIGH (ref 70–99)
Glucose-Capillary: 112 mg/dL — ABNORMAL HIGH (ref 70–99)
Glucose-Capillary: 144 mg/dL — ABNORMAL HIGH (ref 70–99)

## 2022-07-17 LAB — HEMOGLOBIN A1C
Hgb A1c MFr Bld: 6.6 % — ABNORMAL HIGH (ref 4.8–5.6)
Mean Plasma Glucose: 143 mg/dL

## 2022-07-17 MED ORDER — PANTOPRAZOLE SODIUM 40 MG PO TBEC
40.0000 mg | DELAYED_RELEASE_TABLET | Freq: Every day | ORAL | Status: DC
  Administered 2022-07-17 – 2022-07-18 (×2): 40 mg via ORAL
  Filled 2022-07-17 (×2): qty 1

## 2022-07-17 MED ORDER — IPRATROPIUM-ALBUTEROL 0.5-2.5 (3) MG/3ML IN SOLN: 2.0000 mL | Freq: Two times a day (BID) | RESPIRATORY_TRACT | Status: DC

## 2022-07-17 NOTE — NC FL2 (Signed)
[place] LEVEL OF CARE FORM     IDENTIFICATION  Patient Name: Russell Ayers Birthdate: [birth year] Sex: male Admission Date (Current Location): 07/16/2022  [facility] and [place] Number:  Herbalist and Address:  [facility],  [place], [place]      Provider Number: [id number]  Attending Physician Name and Address:  Shelly Coss, MD  Relative Name and Phone Number:  Daughter, Dorethea Clan [phone]    Current Level of Care: Hospital Recommended Level of Care: Assisted Living Facility Prior Approval Number:    Date Approved/Denied:   PASRR Number:    Discharge Plan: Other (Comment) (ALF)    Current Diagnoses: Patient Active Problem List   Diagnosis Date Noted   Left lower lobe pneumonia 07/16/2022   Constipation 07/16/2022   Generalized anxiety disorder 07/16/2022   Glaucoma 07/16/2022   Memory impairment 07/16/2022   Primary insomnia 07/16/2022   Recurrent falls 07/16/2022   Type 2 diabetes mellitus with other specified complication ([place]) [id number]   Groin pain 07/16/2022   Prediabetes 02/05/2021   BPH (benign prostatic hyperplasia) 02/05/2021   S/p left hip fracture 02/04/2021   Left lower quadrant abdominal pain    Seizure ([place])    SBO (small bowel obstruction) ([place]) 07/26/2020   Essential hypertension 07/26/2020   Diabetic neuropathy ([place]) 04/30/2020   Pain due to onychomycosis of toenails of both feet 11/29/2018    Orientation RESPIRATION BLADDER Height & Weight     Self, Time, Situation, Place  Normal Continent Weight: 158 lb 15.2 oz (72.1 kg) Height:  '5\' 11"'$  (180.3 cm)  BEHAVIORAL SYMPTOMS/MOOD NEUROLOGICAL BOWEL NUTRITION STATUS      Continent Diet (See DC summary)  AMBULATORY STATUS COMMUNICATION OF NEEDS Skin   Limited Assist Verbally Normal                       Personal Care Assistance Level of Assistance  Bathing, Feeding, Dressing Bathing Assistance: Limited assistance Feeding  assistance: Independent Dressing Assistance: Limited assistance     Functional Limitations Info  Sight, Speech, Hearing Sight Info: Impaired Hearing Info: Impaired Speech Info: Adequate    SPECIAL CARE FACTORS FREQUENCY                       Contractures Contractures Info: Not present    Additional Factors Info  Code Status, Allergies, Psychotropic Code Status Info: DNR Allergies Info: No Known Allergies Psychotropic Info: See MAR         Current Medications (07/17/2022):  This is the current hospital active medication list Current Facility-Administered Medications  Medication Dose Route Frequency Provider Last Rate Last Admin   acetaminophen (TYLENOL) tablet 650 mg  650 mg Oral Q6H PRN Reubin Milan, MD       Or   acetaminophen (TYLENOL) suppository 650 mg  650 mg Rectal Q6H PRN Reubin Milan, MD       azithromycin [facility]) 500 mg in sodium chloride 0.9 % 250 mL IVPB  500 mg Intravenous Q24H Reubin Milan, MD       cefTRIAXone (ROCEPHIN) 2 g in sodium chloride 0.9 % 100 mL IVPB  2 g Intravenous Q24H Reubin Milan, MD 200 mL/hr at 07/17/22 0351 2 g at 07/17/22 0351   cholecalciferol (VITAMIN D3) 25 MCG (1000 UNIT) tablet 2,000 Units  2,000 Units Oral q morning Reubin Milan, MD   2,000 Units at 07/17/22 1057   diclofenac Sodium (  VOLTAREN) 1 % topical gel 2 g  2 g Topical BID Reubin Milan, MD   2 g at 07/17/22 1107   docusate sodium (COLACE) capsule 100 mg  100 mg Oral BID Reubin Milan, MD   100 mg at 07/17/22 1056   escitalopram (LEXAPRO) tablet 20 mg  20 mg Oral q morning Reubin Milan, MD   20 mg at 07/17/22 1057   ferrous sulfate tablet 325 mg  325 mg Oral Daily Reubin Milan, MD   325 mg at 07/17/22 1058   finasteride (PROSCAR) tablet 5 mg  5 mg Oral q morning Reubin Milan, MD   5 mg at 07/17/22 1057   furosemide (LASIX) tablet 20 mg  20 mg Oral Daily Reubin Milan, MD   20 mg at 07/17/22 1045    insulin aspart (novoLOG) injection 0-9 Units  0-9 Units Subcutaneous TID WC Reubin Milan, MD   3 Units at 07/17/22 1321   latanoprost (XALATAN) 0.005 % ophthalmic solution 1 drop  1 drop Both Eyes QPM Reubin Milan, MD   1 drop at 07/16/22 2048   lithium carbonate capsule 150 mg  150 mg Oral QHS Reubin Milan, MD   150 mg at 07/16/22 2048   LORazepam (ATIVAN) tablet 1 mg  1 mg Oral BID Reubin Milan, MD   1 mg at 07/17/22 1057   [START ON 07/18/2022] metFORMIN (GLUCOPHAGE) tablet 500 mg  500 mg Oral BID WC Reubin Milan, MD       mirabegron ER [facility]) tablet 25 mg  25 mg Oral Daily Reubin Milan, MD   25 mg at 07/17/22 1058   ondansetron [facility]) tablet 4 mg  4 mg Oral Q6H PRN Reubin Milan, MD       Or   ondansetron [facility]) injection 4 mg  4 mg Intravenous Q6H PRN Reubin Milan, MD       pantoprazole (PROTONIX) EC tablet 40 mg  40 mg Oral Daily Adhikari, Tamsen Meek, MD       polyethylene glycol (MIRALAX / GLYCOLAX) packet 17 g  17 g Oral Daily PRN Reubin Milan, MD   17 g at 07/17/22 1321   tamsulosin (FLOMAX) capsule 0.4 mg  0.4 mg Oral QPM Reubin Milan, MD   0.4 mg at 07/16/22 2050   temazepam (RESTORIL) capsule 15 mg  15 mg Oral QHS Reubin Milan, MD   15 mg at 07/16/22 2050   timolol (TIMOPTIC) 0.5 % ophthalmic solution 1 drop  1 drop Both Eyes QPM Reubin Milan, MD   1 drop at 07/16/22 2049   traZODone (DESYREL) tablet 100 mg  100 mg Oral QHS Reubin Milan, MD   100 mg at 07/16/22 2050   vitamin B-12 (CYANOCOBALAMIN) tablet 100 mcg  100 mcg Oral q morning Reubin Milan, MD   100 mcg at 07/17/22 1057   zinc oxide 20 % ointment 1 Application  1 Application Topical PRN Reubin Milan, MD         Discharge Medications: Please see discharge summary for a list of discharge medications.  Relevant Imaging Results:  Relevant Lab Results:   Additional Information SSN: [id number]  Vassie Moselle,  LCSW

## 2022-07-17 NOTE — Progress Notes (Signed)
Modified Barium Swallow Study  Patient Details  Name: Russell Ayers MRN: [id number] Date of Birth: [birth year]  Today's Date: 07/17/2022  Modified Barium Swallow completed.  Full report located under Chart Review in the Imaging Section.  History of Present Illness Patient is an 89 y.o. male with PMH: anemia, osteoarthritis, constipation, generalized anxiety disoder, glaucoma, HTN, insomnia, memory impairment, PNA, SBO, CVA, DM-2. He resides at ALF [facility] Patient was diagnosed with RSV about a week prior to this admission and has had persistent cough since then. He presented to [facility] on 07/16/22 with left lower chest wall pain. Portable chest radiograph showed left lower lung opacity; CT angio/chestPE negative for PE but did show retained mucous and or debris in left mainstem bronchus with subtotal filling of left lower lobe bronchi, Moderate-sized  left lower lobe airspace consolidation, moderate sized hiatal hernia. SLP swallow evaluation ordered due to patient coughing with meds when taken with both water and with puree (applesauce).   Clinical Impression Patient presents with what is likely a chronic dysphagia with significant structural impact. His cervical vertebrae are curved, causing reduced pharyngeal space. When epiglottis was in the process of inverting, it came into contact with pharyngeal wall which prevented full inversion. This led to thin liquid barium entering laryngeal vestibule with silent and sensed aspiration occuring during and after the swallow was completed. Patient exhibited delays in swallow initiation with all barium consistencies at level of the vallecula and with honey and nectar thick liquids, delay at posterior laryngeal surface of epiglottis. No significant retention of barium observed in pharynx with puree solids but mild amount of barium residual observed with thin, nectar thick, honey thick liquids within vallecula and trace in pyriform sinus. and posterior  pharyngeal wall. Patient was cued to swallow again to clear residuals, however he did not have adequate sensation and would tell SLP, "there is nothing to swallow". Chin tuck posture with thin liquids did effectively prevent aspiration during the swallow but penetration above the vocal cords did occur and aspiration after the swallow from pharyngeal residuals still occured. Patient was able to clear some penetrate with cued cough, however this was not effective to clear aspirate. Barium tablet (84m) taken with puree solids, transited through pharynx and upper esophagus without difficulty. SLP recommending Dys 2 (minced/fine chop) solids and nectar thick liquids. Factors that may increase risk of adverse event in presence of aspiration ([place]): Poor general health and/or compromised immunity;Reduced cognitive function;Frail or deconditioned  Swallow Evaluation Recommendations Recommendations: PO diet PO Diet Recommendation: Dysphagia 2 (Finely chopped);Mildly thick liquids (Level 2, nectar thick) Liquid Administration via: Straw;Cup Medication Administration: Other (Comment) (whole or crushed in puree) Supervision: Intermittent supervision/cueing for swallowing strategies;Set-up assistance for safety Swallowing strategies  : Slow rate;Small bites/sips;Multiple dry swallows after each bite/sip;Clear throat intermittently;Follow solids with liquids Postural changes: Position pt fully upright for meals;Stay upright 30-60 min after meals Oral care recommendations: Oral care BID (2x/day) Caregiver Recommendations: Avoid jello, ice cream, thin soups, popsicles;Remove water pitcher;Have oral suction available      JSonia Baller MA, CCC-SLP Speech Therapy

## 2022-07-17 NOTE — TOC Initial Note (Signed)
Transition of Care [facility]) - Initial/Assessment Note    Patient Details  Name: Russell Ayers MRN: [id number] Date of Birth: July 11, 1932  [facility]) CM/SW Contact:    Vassie Moselle, LCSW Phone Number: 07/17/2022, 2:51 PM  Clinical Narrative:                 Spoke with pt who confirms he has been staying at [facility]. Pt agreeable to return at discharge. CSW spoke with pt's daughter who is agreeable to pt returning to ALF and plans to provide transportation to facility at discharge.  CSW contacted [facility] and confirmed pt is able to return at discharge.  Current DC plan for 07/18/22.   Expected Discharge Plan: Assisted Living Barriers to Discharge: No Barriers Identified   Patient Goals and CMS Choice Patient states their goals for this hospitalization and ongoing recovery are:: To return to [facility].gov Compare Post Acute Care list provided to:: Patient Choice offered to / list presented to : Patient, Adult [place] ownership interest in [facility].provided to:: Adult Children    Expected Discharge Plan and Services In-house Referral: Clinical Social Work Discharge Planning Services: NA Post Acute Care Choice: Resumption of Svcs/PTA Provider, Nursing Home Living arrangements for the past 2 months: [place]                 DME Arranged: N/A DME Agency: NA                  Prior Living Arrangements/Services Living arrangements for the past 2 months: [place] Lives with:: Facility Resident Patient language and need for interpreter reviewed:: Yes Do you feel safe going back to the place where you live?: Yes      Need for Family Participation in Patient Care: No (Comment) Care giver support system in place?: Yes (comment) Current home services: DME Criminal Activity/Legal Involvement Pertinent to Current Situation/Hospitalization: No - Comment as  needed  Activities of Daily Living Home Assistive Devices/Equipment: Dentures (specify type), Hospital bed, Grab bars around toilet, Grab bars in shower, Shower chair with back, Environmental consultant (specify type) ADL Screening (condition at time of admission) Patient's cognitive ability adequate to safely complete daily activities?: No Is the patient deaf or have difficulty hearing?: Yes Does the patient have difficulty seeing, even when wearing glasses/contacts?: Yes Does the patient have difficulty concentrating, remembering, or making decisions?: Yes Patient able to express need for assistance with ADLs?: Yes Does the patient have difficulty dressing or bathing?: Yes Independently performs ADLs?: No Communication: Independent with device (comment) Dressing (OT): Needs assistance Is this a change from baseline?: Pre-admission baseline Grooming: Needs assistance Is this a change from baseline?: Pre-admission baseline Feeding: Independent Bathing: Needs assistance Is this a change from baseline?: Pre-admission baseline Toileting: Needs assistance Is this a change from baseline?: Pre-admission baseline In/Out Bed: Needs assistance Is this a change from baseline?: Pre-admission baseline Walks in Home: Needs assistance Is this a change from baseline?: Pre-admission baseline Does the patient have difficulty walking or climbing stairs?: Yes Weakness of Legs: Both Weakness of Arms/Hands: Both  Permission Sought/Granted Permission sought to share information with : Facility Sport and exercise psychologist, Family Supports Permission granted to share information with : Yes, Verbal Permission Granted  Share Information with NAME: Dorethea Clan  Permission granted to share info w AGENCY: [place] granted to share info w Relationship: Daughter  Permission granted to Contractor Information: [phone]  Emotional Assessment   Attitude/Demeanor/Rapport: Engaged Affect (typically  observed): Accepting Orientation: : Oriented to Self, Oriented to Place, Oriented to  Time, Oriented to Situation Alcohol / Substance Use: Not Applicable Psych Involvement: No (comment)  Admission diagnosis:  Left lower lobe pneumonia [J18.9] HCAP (healthcare-associated pneumonia) [J18.9] Patient Active Problem List   Diagnosis Date Noted   Left lower lobe pneumonia 07/16/2022   Constipation 07/16/2022   Generalized anxiety disorder 07/16/2022   Glaucoma 07/16/2022   Memory impairment 07/16/2022   Primary insomnia 07/16/2022   Recurrent falls 07/16/2022   Type 2 diabetes mellitus with other specified complication ([place]) [id number]   Groin pain 07/16/2022   Prediabetes 02/05/2021   BPH (benign prostatic hyperplasia) 02/05/2021   S/p left hip fracture 02/04/2021   Left lower quadrant abdominal pain    Seizure ([place])    SBO (small bowel obstruction) ([place]) 07/26/2020   Essential hypertension 07/26/2020   Diabetic neuropathy ([place]) 04/30/2020   Pain due to onychomycosis of toenails of both feet 11/29/2018   PCP:  System, Provider Not In Pharmacy:   [place], [place] [facility] [place] [address] Phone: [phone] Fax: [phone]     Social Determinants of Health (SDOH) Social History: SDOH Screenings   Tobacco Use: Medium Risk (07/16/2022)   SDOH Interventions:     Readmission Risk Interventions    07/17/2022    2:49 PM  Readmission Risk Prevention Plan  Transportation Screening Complete  PCP or Specialist Appt within 5-7 Days Complete  Home Care Screening Complete  Medication Review (RN CM) Complete

## 2022-07-17 NOTE — Progress Notes (Signed)
PROGRESS NOTE  Russell Ayers  [id number] DOB: 1933/01/13 DOA: 07/16/2022 PCP: System, Provider Not In   Brief Narrative: Patient is 89 year old male with history of anemia, constipation, GERD, glaucoma, hypertension, memory impairment, left hip fracture, CVA, type 2 diabetes who presented from [facility] facility for the evaluation of left-sided chest pain.  He was diagnosed with RSV about a week ago and was having persistent cough.  On presentation, he was hemodynamically stable.  Lab work showed mild leukocytosis of 13.2, normal lactic acid level.  BNP normal.  Chest x-ray showed left lower lobe opacity representing atelectasis versus airspace disease/pneumonia.  CT chest did not show any PE but showed left-sided airspace consolidation, mild moderate-sized pleural effusion, moderate-sized hiatal hernia.  Patient was started on antibiotics for the management of  pneumonia.  Speech therapy consulted, started on dysphagia 2 diet.  Remains on room air.  Plan for discharge back to nursing facility tomorrow  Assessment & Plan:  Principal Problem:   Left lower lobe pneumonia Active Problems:   Diabetic neuropathy ([place])   Essential hypertension   Seizure ([facility])   BPH (benign prostatic hyperplasia)   Constipation   Generalized anxiety disorder   Glaucoma   Memory impairment   Primary insomnia   Type 2 diabetes mellitus with other specified complication ([facility])  Left lower lobe pneumonia: recent history of RSV infection.  Chest x-ray/CT chest showing left-sided consolidation with pleural effusion.  Features suggesting aspiration. Currently he is on room air.  Continue current antibiotics.  Speech therapy consulted.  Currently on dysphagia 2 diet CT imaging showed retained mucus and/or debris within the left mainstem bronchus ,moderate size left lower lobe airspace consolidation,Small to moderate sized left pleural effusion. But patient remains comfortable on room air not  coughing. Has mild leucocytosis  Type 2 diabetes: On metformin at home.  Currently on sliding scale.  A1c pending  Hypertension: On Lasix.  Monitor blood pressure  History of BPH: On finasteride, Myrbetriq, tamsulosin.  On chronic indwelling Foley catheter.  Follows with urology  Constipation: continue bowel regimen  History of GAD: On Lexapro, lorazepam, lithium  History of glaucoma: Continue home eyedrops  History of insomnia: On temazepam, trazodone  History of seizure disorder: Continue antiseizure medications  Memory impairment: Continue delirium precaution.  Frequent reorientation.  Currently remains alert oriented.  Debility/deconditioning: Lives at nursing facility.  Walks with help of walker       DVT prophylaxis:SCDs Start: 07/16/22 1756     Code Status: DNR  Family Communication: Called and discussed with daughter on phone [phone]  Patient status: Inpatient  Patient is from : SNF  Anticipated discharge to: SNF  Estimated DC date: Tomorrow   Consultants: None  Procedures: None  Antimicrobials:  Anti-infectives (From admission, onward)    Start     Dose/Rate Route Frequency Ordered Stop   07/17/22 0400  cefTRIAXone (ROCEPHIN) 2 g in sodium chloride 0.9 % 100 mL IVPB        2 g 200 mL/hr over 30 Minutes Intravenous Every 24 hours 07/16/22 1754 07/22/22 0359   07/16/22 1800  azithromycin (ZITHROMAX) 500 mg in sodium chloride 0.9 % 250 mL IVPB        500 mg 250 mL/hr over 60 Minutes Intravenous Every 24 hours 07/16/22 1754 07/21/22 1759   07/16/22 1400  ceFEPIme (MAXIPIME) 1 g in sodium chloride 0.9 % 100 mL IVPB  Status:  Discontinued        1 g 200 mL/hr over 30 Minutes Intravenous  Once 07/16/22 1345 07/16/22 1348   07/16/22 1400  ceFEPIme (MAXIPIME) 2 g in sodium chloride 0.9 % 100 mL IVPB        2 g 200 mL/hr over 30 Minutes Intravenous  Once 07/16/22 1348 07/16/22 1725   07/16/22 1400  vancomycin (VANCOREADY) IVPB 1500 mg/300 mL        1,500  mg 150 mL/hr over 120 Minutes Intravenous  Once 07/16/22 1348 07/16/22 2051       Subjective: Patient seen and examined at bedside today.  HEMODYNAMICALLY stable.  Comfortable.  On room air.  Not coughing.  Denies shortness of breath.  No complaints  Objective: Vitals:   07/16/22 1954 07/16/22 2213 07/17/22 0554 07/17/22 0811  BP:  114/65 119/76   Pulse:  100 67   Resp:  17 18   Temp:  98.3 F (36.8 C) 98.1 F (36.7 C)   TempSrc:  Oral Oral   SpO2: 100% 95% 97% 98%  Weight:      Height:        Intake/Output Summary (Last 24 hours) at 07/17/2022 [id number] Last data filed at 07/17/2022 0400 Gross per 24 hour  Intake 121.43 ml  Output 850 ml  Net -728.57 ml   Filed Weights   07/16/22 1259 07/16/22 1855  Weight: 71.2 kg 72.1 kg    Examination:  General exam: Overall comfortable, not in distress, pleasant elderly male HEENT: PERRL Respiratory system: Diminished AIR sounds bilaterally, no wheezes or crackles  Cardiovascular system: S1 & S2 heard, RRR.  Gastrointestinal system: Abdomen is nondistended, soft and nontender. Central nervous system: Alert and oriented Extremities: No edema, no clubbing ,no cyanosis Skin: No rashes, no ulcers,no icterus     Data Reviewed: I have personally reviewed following labs and imaging studies  CBC: Recent Labs  Lab 07/16/22 1329 07/17/22 0427  WBC 13.2* 12.6*  NEUTROABS 9.7*  --   HGB 9.7* 9.5*  HCT 30.0* 29.9*  MCV 95.2 96.1  PLT 507* [id number]*   Basic Metabolic Panel: Recent Labs  Lab 07/16/22 1329 07/17/22 0427  NA 134* 132*  K 4.3 4.0  CL 98 98  CO2 25 26  GLUCOSE 158* 117*  BUN 15 15  CREATININE 1.01 0.90  CALCIUM 8.6* 8.8*     Recent Results (from the past 240 hour(s))  Blood culture (routine x 2)     Status: None (Preliminary result)   Collection Time: 07/16/22  3:16 PM   Specimen: BLOOD RIGHT FOREARM  Result Value Ref Range Status   Specimen Description   Final    BLOOD RIGHT FOREARM Performed at [facility], [address], [place], [place] [address]    Special Requests   Final    BOTTLES DRAWN AEROBIC AND ANAEROBIC Blood Culture results may not be optimal due to an inadequate volume of blood received in culture bottles Performed at [place] [address] [place], [place] [address]    Culture PENDING  Incomplete   Report Status PENDING  Incomplete  Blood culture (routine x 2)     Status: None (Preliminary result)   Collection Time: 07/16/22  4:29 PM   Specimen: BLOOD LEFT ARM  Result Value Ref Range Status   Specimen Description   Final    BLOOD LEFT ARM Performed at [facility], [place] [address], [place],  [address]    Special Requests   Final    BOTTLES DRAWN AEROBIC AND ANAEROBIC Blood Culture adequate volume Performed at  [facility], [place] [address], [place], [place] [address]    Culture PENDING  Incomplete   Report Status PENDING  Incomplete  Resp panel by RT-PCR (RSV, Flu A&B, Covid) Anterior Nasal Swab     Status: Abnormal   Collection Time: 07/16/22  4:34 PM   Specimen: Anterior Nasal Swab  Result Value Ref Range Status   SARS Coronavirus 2 by RT PCR NEGATIVE NEGATIVE Final    Comment: (NOTE) SARS-CoV-2 target nucleic acids are NOT DETECTED.  The SARS-CoV-2 RNA is generally detectable in upper respiratory specimens during the acute phase of infection. The lowest concentration of SARS-CoV-2 viral copies this assay can detect is 138 copies/mL. A negative result does not preclude SARS-Cov-2 infection and should not be used as the sole basis for treatment or other patient management decisions. A negative result may occur with  improper specimen collection/handling, submission of specimen other than nasopharyngeal swab, presence of viral mutation(s) within the areas targeted by this assay, and inadequate number of viral copies(<138 copies/mL). A negative result must be combined with clinical observations, patient  history, and epidemiological information. The expected result is Negative.  Fact Sheet for Patients:  EntrepreneurPulse.com.au  Fact Sheet for Healthcare Providers:  IncredibleEmployment.be  This test is no t yet approved or cleared by the [place] FDA and  has been authorized for detection and/or diagnosis of SARS-CoV-2 by FDA under an Emergency Use Authorization (EUA). This EUA will remain  in effect (meaning this test can be used) for the duration of the COVID-19 declaration under Section 564(b)(1) of the Act, 21 U.S.C.section 360bbb-3(b)(1), unless the authorization is terminated  or revoked sooner.       Influenza A by PCR NEGATIVE NEGATIVE Final   Influenza B by PCR NEGATIVE NEGATIVE Final    Comment: (NOTE) The Xpert Xpress SARS-CoV-2/FLU/RSV plus assay is intended as an aid in the diagnosis of influenza from Nasopharyngeal swab specimens and should not be used as a sole basis for treatment. Nasal washings and aspirates are unacceptable for Xpert Xpress SARS-CoV-2/FLU/RSV testing.  Fact Sheet for Patients: EntrepreneurPulse.com.au  Fact Sheet for Healthcare Providers: IncredibleEmployment.be  This test is not yet approved or cleared by the [place] FDA and has been authorized for detection and/or diagnosis of SARS-CoV-2 by FDA under an Emergency Use Authorization (EUA). This EUA will remain in effect (meaning this test can be used) for the duration of the COVID-19 declaration under Section 564(b)(1) of the Act, 21 U.S.C. section 360bbb-3(b)(1), unless the authorization is terminated or revoked.     Resp Syncytial Virus by PCR POSITIVE (A) NEGATIVE Final    Comment: (NOTE) Fact Sheet for Patients: EntrepreneurPulse.com.au  Fact Sheet for Healthcare Providers: IncredibleEmployment.be  This test is not yet approved or cleared by the [place]  FDA and has been authorized for detection and/or diagnosis of SARS-CoV-2 by FDA under an Emergency Use Authorization (EUA). This EUA will remain in effect (meaning this test can be used) for the duration of the COVID-19 declaration under Section 564(b)(1) of the Act, 21 U.S.C. section 360bbb-3(b)(1), unless the authorization is terminated or revoked.  Performed at [facility], [place] [address], [place], [place] [address]      Radiology Studies: CT ABDOMEN PELVIS W CONTRAST  Result Date: 07/16/2022 CLINICAL DATA:  Abdominal pain, acute. Technologist notes state RSV. EXAM: CT ABDOMEN AND PELVIS WITH CONTRAST TECHNIQUE: Multidetector CT imaging of the abdomen and pelvis was performed using the standard protocol following bolus administration of intravenous contrast.  RADIATION DOSE REDUCTION: This exam was performed according to the departmental dose-optimization program which includes automated exposure control, adjustment of the mA and/or kV according to patient size and/or use of iterative reconstruction technique. CONTRAST:  147m OMNIPAQUE IOHEXOL 350 MG/ML SOLN COMPARISON:  CT 11/25/2021 FINDINGS: Lower chest: Assessed on concurrent chest CTA, reported separately Hepatobiliary: No focal liver abnormality is seen. No gallstones, gallbladder wall thickening, or biliary dilatation. Pancreas: Parenchymal atrophy. No ductal dilatation or inflammation. Spleen: Normal in size. Stable subcentimeter hypodensity in the anterior spleen, typically benign. Adrenals/Urinary Tract: No adrenal nodule. No hydronephrosis, perinephric edema or focal renal abnormality. Symmetric excretion on delayed phase imaging. There is a Foley catheter in the urinary bladder, however the bladder is not decompressed. There is moderate diffuse bladder wall thickening. Stomach/Bowel: Moderate-sized hiatal hernia. There is mild irregular wall thickening of the herniated stomach in the lower thorax. Fluid-filled loops  of small bowel in the pelvis but no evidence of inflammation or obstruction. The appendix is normal. Moderate stool in the proximal colon. Left colonic diverticulosis without diverticulitis. Vascular/Lymphatic: Prominent aortic and branch atherosclerosis. No aortic aneurysm. Patent portal vein. No abdominopelvic adenopathy. Reproductive: Prostate is unremarkable. Other: Prior left inguinal hernia repair. No ascites or free air. Musculoskeletal: Chronic T12 compression deformity. Postsurgical change of the left proximal femur. Scoliosis and degenerative change in the spine. No acute osseous findings. IMPRESSION: 1. Moderate-sized hiatal hernia. Mild irregular wall thickening of the herniated stomach in the lower thorax, suspicious for gastritis. 2. Moderate diffuse bladder wall thickening, which may be due to nondistention, however recommend correlation with urinalysis to exclude urinary tract infection. 3. Left colonic diverticulosis without diverticulitis. Aortic Atherosclerosis (ICD10-I70.0). Electronically Signed   By: MKeith RakeM.D.   On: 07/16/2022 16:26   CT Angio Chest PE W and/or Wo Contrast  Result Date: 07/16/2022 CLINICAL DATA:  Pulmonary embolism (PE) suspected, low to intermediate prob, positive D-dimer Technologist note states RSV EXAM: CT ANGIOGRAPHY CHEST WITH CONTRAST TECHNIQUE: Multidetector CT imaging of the chest was performed using the standard protocol during bolus administration of intravenous contrast. Multiplanar CT image reconstructions and MIPs were obtained to evaluate the vascular anatomy. Performed in conjunction with CT of the abdomen and pelvis, reported separately. RADIATION DOSE REDUCTION: This exam was performed according to the departmental dose-optimization program which includes automated exposure control, adjustment of the mA and/or kV according to patient size and/or use of iterative reconstruction technique. CONTRAST:  1029mOMNIPAQUE IOHEXOL 350 MG/ML SOLN  COMPARISON:  Chest radiograph earlier today. FINDINGS: Cardiovascular: There are no filling defects within the pulmonary arteries to suggest pulmonary embolus. Moderate aortic atherosclerosis without aneurysm or acute aortic findings. The heart is upper normal in size. No pericardial effusion. Mediastinum/Nodes: Moderate-sized hiatal hernia. There is wall thickening of the distal esophagus. 11 mm anterior paratracheal node. Mild left hilar adenopathy measuring up to 12 mm. Shotty right hilar lymph nodes. Lungs/Pleura: Retained mucus and/or debris within the left mainstem bronchus with subtotal filling of the left lower lobe bronchi. There is filling of the segmental right lower lobe bronchi. Moderate-sized left lower lobe airspace consolidation. Small to moderate size left pleural effusion, partially loculated tracking into the fissure. Peripheral/dependent airspace disease in the right lower lobe with faint tree-in-bud opacities. There is no right pleural effusion. Upper Abdomen: Assessed on concurrent abdominal CT, reported separately. Musculoskeletal: Exaggerated thoracic kyphosis. The bones are subjectively under mineralized. Mild T12 superior endplate compression deformity is chronic and unchanged from prior abdominal CT. Review of the MIP images confirms  the above findings. IMPRESSION: 1. No pulmonary embolus. 2. Retained mucus and/or debris within the left mainstem bronchus with subtotal filling of the left lower lobe bronchi. Moderate-sized left lower lobe airspace consolidation. Small to moderate size left pleural effusion, partially loculated tracking into the fissure. 3. Segmental bronchial filling in the right lower lobe. Peripheral/dependent airspace disease in the right lower lobe with faint tree-in-bud opacities. 4. Overall findings may represent infection or aspiration sequela. Recommend correlation with aspiration risk factors. 5. Moderate-sized hiatal hernia. Distal esophageal wall thickening, can  be seen with reflux or esophagitis. 6. Mild mediastinal and left hilar adenopathy is likely reactive in this setting. Aortic Atherosclerosis (ICD10-I70.0). Electronically Signed   By: Keith Rake M.D.   On: 07/16/2022 16:08   DG Chest Portable 1 View  Result Date: 07/16/2022 CLINICAL DATA:  Shortness of breath and chest pain. EXAM: PORTABLE CHEST 1 VIEW COMPARISON:  None Available. FINDINGS: The cardiomediastinal silhouette is unremarkable. LEFT LOWER lung opacity is noted and may represent atelectasis or airspace disease. Mild RIGHT basilar atelectasis is present. There is no evidence of pneumothorax or large pleural effusion. No acute bony abnormalities are present. IMPRESSION: LEFT LOWER lung opacity which may represent atelectasis or airspace disease/pneumonia. Radiographic follow-up to resolution is recommended. Mild RIGHT basilar atelectasis. Electronically Signed   By: Margarette Canada M.D.   On: 07/16/2022 13:40    Scheduled Meds:  cholecalciferol  2,000 Units Oral q morning   diclofenac Sodium  2 g Topical BID   docusate sodium  100 mg Oral BID   escitalopram  20 mg Oral q morning   ferrous sulfate  325 mg Oral Daily   finasteride  5 mg Oral q morning   furosemide  20 mg Oral Daily   insulin aspart  0-9 Units Subcutaneous TID WC   ipratropium-albuterol  2 mL Nebulization BID   latanoprost  1 drop Both Eyes QPM   lithium carbonate  150 mg Oral QHS   LORazepam  1 mg Oral BID   [START ON 07/18/2022] metFORMIN  500 mg Oral BID WC   mirabegron ER  25 mg Oral Daily   tamsulosin  0.4 mg Oral QPM   temazepam  15 mg Oral QHS   timolol  1 drop Both Eyes QPM   traZODone  100 mg Oral QHS   vitamin B-12  100 mcg Oral q morning   Continuous Infusions:  azithromycin     cefTRIAXone (ROCEPHIN)  IV 2 g (07/17/22 0351)     LOS: 1 day   Shelly Coss, MD [facility] P2/26/2024, 8:19 AM

## 2022-07-17 NOTE — Evaluation (Signed)
Clinical/Bedside Swallow Evaluation Patient Details  Name: Russell Ayers MRN: [id number] Date of Birth: 1933/01/08  Today's Date: 07/17/2022 Time: SLP Start Time (ACUTE ONLY): 68 SLP Stop Time (ACUTE ONLY): [id number] SLP Time Calculation (min) (ACUTE ONLY): 15 min  Past Medical History:  Past Medical History:  Diagnosis Date   Anemia    Arthritis    Constipation 07/16/2022   Generalized anxiety disorder    Glaucoma    History of kidney stones    Hypertension    Inguinal hernia    Insomnia    Memory impairment 07/16/2022   Pneumonia    July 2023   Primary insomnia 07/16/2022   Prostatism    S/p left hip fracture 02/04/2021   SBO (small bowel obstruction) ([place]) 07/26/2020   Seizure ([place])    Stroke ([place])    Type 2 diabetes mellitus with other specified complication ([place]) [id number]   Past Surgical History:  Past Surgical History:  Procedure Laterality Date   CATARACT EXTRACTION, BILATERAL     FEMUR IM NAIL Left 02/05/2021   Procedure: INTRAMEDULLARY (IM) NAIL FEMORAL;  Surgeon: Renette Butters, MD;  Location: [facility];  Service: Orthopedics;  Laterality: Left;   glaucoma     HIP SURGERY Left    INGUINAL HERNIA REPAIR Left 02/08/2022   Procedure: LEFT HERNIA REPAIR INGUINAL ADULT;  Surgeon: Clovis Riley, MD;  Location: [facility];  Service: General;  Laterality: Left;   HPI:  Patient is an 89 y.o. male with PMH: anemia, osteoarthritis, constipation, generalized anxiety disoder, glaucoma, HTN, insomnia, memory impairment, PNA, SBO, CVA, DM-2. He resides at ALF [facility] Patient was diagnosed with RSV about a week prior to this admission and has had persistent cough since then. He presented to [facility] on 07/16/22 with left lower chest wall pain. Portable chest radiograph showed left lower lung opacity; CT angio/chestPE negative for PE but did show retained mucous and or debris in left mainstem bronchus with subtotal filling of left lower lobe bronchi, Moderate-sized  left  lower lobe airspace consolidation, moderate sized hiatal hernia. SLP swallow evaluation ordered due to patient coughing with meds when taken with both water and with puree (applesauce).    Assessment / Plan / Recommendation  Clinical Impression  Patient presents with clinical s/s of dysphagia as per this bedside swallow evaluation. SLP assessed his PO toleration as RN administered oral meds. Patient told SLP that at his ALF, they "grind" his meds up. One of his current medications is not able to be crushed and so RN gave that to him whole in applesauce. He exhibited mildly delayed cough after swallowing. He would drink several sips of water afterwards and no overt s/s aspiration or penetration occured when patient drinking liquids. He had another cough after a spoonful of meds crushed in applesauce and this time his cough was productive; he expectorated some applesauce mixed with secretions and some of the crushed meds. SLP recommending objective swallow study (MBS) due to concern of pharyngeal phase dysphagia. SLP Visit Diagnosis: Dysphagia, unspecified (R13.10)    Aspiration Risk  Mild aspiration risk;Moderate aspiration risk    Diet Recommendation Other (Comment) (PO recommendations will be made after MBS)   Medication Administration: Crushed with puree Postural Changes: Seated upright at 90 degrees;Remain upright for at least 30 minutes after po intake    Other  Recommendations Oral Care Recommendations: Oral care BID    Recommendations for follow up therapy are one component of a multi-disciplinary discharge planning process, led by the attending  physician.  Recommendations may be updated based on patient status, additional functional criteria and insurance authorization.  Follow up Recommendations Other (comment) (TBD)      Assistance Recommended at Discharge    Functional Status Assessment Patient has had a recent decline in their functional status and demonstrates the ability to make  significant improvements in function in a reasonable and predictable amount of time.  Frequency and Duration min 2x/week  1 week       Prognosis Prognosis for improved oropharyngeal function: Good Barriers to Reach Goals: Time post onset;Severity of deficits      Swallow Study   General Date of Onset: 07/16/22 HPI: Patient is an 89 y.o. male with PMH: anemia, osteoarthritis, constipation, generalized anxiety disoder, glaucoma, HTN, insomnia, memory impairment, PNA, SBO, CVA, DM-2. He resides at ALF [facility]). Patient was diagnosed with RSV about a week prior to this admission and has had persistent cough since then. He presented to [facility] on 07/16/22 with left lower chest wall pain. Portable chest radiograph showed left lower lung opacity; CT angio/chestPE negative for PE but did show retained mucous and or debris in left mainstem bronchus with subtotal filling of left lower lobe bronchi, Moderate-sized  left lower lobe airspace consolidation, moderate sized hiatal hernia. SLP swallow evaluation ordered due to patient coughing with meds when taken with both water and with puree (applesauce). Type of Study: Bedside Swallow Evaluation Previous Swallow Assessment: remote BSE during previous admission (2022) Diet Prior to this Study: Regular;Thin liquids (Level 0) Temperature Spikes Noted: No Respiratory Status: Room air History of Recent Intubation: No Behavior/Cognition: Cooperative;Pleasant mood;Alert Oral Cavity Assessment: Within Functional Limits Oral Care Completed by SLP: No Oral Cavity - Dentition: Dentures, top;Adequate natural dentition Vision: Functional for self-feeding Self-Feeding Abilities: Able to feed self Patient Positioning: Upright in bed Baseline Vocal Quality: Normal Volitional Cough: Strong Volitional Swallow: Able to elicit    Oral/Motor/Sensory Function Overall Oral Motor/Sensory Function: Within functional limits   Ice Chips     Thin Liquid  Presentation: Straw;Self Fed    Nectar Thick     Honey Thick     Puree Puree: Impaired Pharyngeal Phase Impairments: Other (comments) Other Comments: meds taken both whole and crushed in applesauce which led to slightly delayed cough, one of which was productive and patient expectorating some applesauce and meds   Solid     Solid: Not tested      Sonia Baller, MA, CCC-SLP Speech Therapy

## 2022-07-18 DIAGNOSIS — J189 Pneumonia, unspecified organism: Secondary | ICD-10-CM | POA: Diagnosis not present

## 2022-07-18 LAB — CBC
HCT: 28.3 % — ABNORMAL LOW (ref 39.0–52.0)
Hemoglobin: 9.1 g/dL — ABNORMAL LOW (ref 13.0–17.0)
MCH: 30.6 pg (ref 26.0–34.0)
MCHC: 32.2 g/dL (ref 30.0–36.0)
MCV: 95.3 fL (ref 80.0–100.0)
Platelets: 458 10*3/uL — ABNORMAL HIGH (ref 150–400)
RBC: 2.97 MIL/uL — ABNORMAL LOW (ref 4.22–5.81)
RDW: 12 % (ref 11.5–15.5)
WBC: 9.9 10*3/uL (ref 4.0–10.5)
nRBC: 0 % (ref 0.0–0.2)

## 2022-07-18 LAB — GLUCOSE, CAPILLARY
Glucose-Capillary: 123 mg/dL — ABNORMAL HIGH (ref 70–99)
Glucose-Capillary: 127 mg/dL — ABNORMAL HIGH (ref 70–99)

## 2022-07-18 MED ORDER — PANTOPRAZOLE SODIUM 40 MG PO TBEC: 40.0000 mg | DELAYED_RELEASE_TABLET | Freq: Every day | ORAL | 0 refills | Status: AC

## 2022-07-18 MED ORDER — AMOXICILLIN-POT CLAVULANATE 875-125 MG PO TABS
1.0000 | ORAL_TABLET | Freq: Two times a day (BID) | ORAL | Status: DC
  Administered 2022-07-18: 1 via ORAL
  Filled 2022-07-18: qty 1

## 2022-07-18 MED ORDER — ACETAMINOPHEN 500 MG PO TABS: 1000.0000 mg | ORAL_TABLET | Freq: Three times a day (TID) | ORAL | 0 refills | Status: AC | PRN

## 2022-07-18 MED ORDER — AMOXICILLIN-POT CLAVULANATE 875-125 MG PO TABS: 1.0000 | ORAL_TABLET | Freq: Two times a day (BID) | ORAL | 0 refills | Status: AC

## 2022-07-18 NOTE — Progress Notes (Signed)
Patient has orders to be discharged to [place]. Report given to [place]. Daughter has been notified of discharge and will transport patient to facility.

## 2022-07-18 NOTE — TOC Transition Note (Addendum)
Transition of Care [facility]) - CM/SW Discharge Note   Patient Details  Name: Russell Ayers MRN: [id number] Date of Birth: July 28, 1932  Transition of Care [facility]) CM/SW Contact:  Vassie Moselle, LCSW Phone Number: 07/18/2022, 12:01 PM   Clinical Narrative:    Pt is to return to [facility]. Pt will be going to room A11. RN to call report to [phone]. Pt's daughter is to provide transportation for pt at discharge.   Pt recommended for home health SLP. [facility] who share that they will provide SLP for pt when he returns.    Final next level of care: Assisted Living Barriers to Discharge: No Barriers Identified   Patient Goals and CMS Choice CMS Medicare.gov Compare Post Acute Care list provided to:: Patient Choice offered to / list presented to : Patient, Adult Children  Discharge Placement                Patient chooses bed at: [facility] Patient to be transferred to facility by: Daughter Name of family member notified: Leslie Andrea Patient and family notified of of transfer: 07/17/22  Discharge Plan and Services Additional resources added to the After Visit Summary for   In-house Referral: Clinical Social Work Discharge Planning Services: NA Post Acute Care Choice: Resumption of Svcs/PTA Provider, Nursing Home          DME Arranged: N/A DME Agency: NA                  Social Determinants of Health ([place]) Interventions SDOH Screenings   Tobacco Use: Medium Risk (07/16/2022)     Readmission Risk Interventions    07/18/2022   12:00 PM 07/17/2022    2:49 PM  Readmission Risk Prevention Plan  Transportation Screening Complete Complete  PCP or Specialist Appt within 5-7 Days Complete Complete  Home Care Screening Complete Complete  Medication Review (RN CM) Complete Complete

## 2022-07-18 NOTE — Discharge Summary (Signed)
Physician Discharge Summary  Russell Ayers [id number] DOB: [birth year] DOA: 07/16/2022  PCP: System, Provider Not In  Admit date: 07/16/2022 Discharge date: 07/18/2022  Admitted From: Home Disposition:  Home  Discharge Condition:Stable CODE STATUS: DNR Diet recommendation:  Dysphagia 2  Brief/Interim Summary: Patient is 89 year old male with history of anemia, constipation, GERD, glaucoma, hypertension, memory impairment, left hip fracture, CVA, type 2 diabetes who presented from [place] facility for the evaluation of left-sided chest pain.  He was diagnosed with RSV about a week ago and was having persistent cough.  On presentation, he was hemodynamically stable.  Lab work showed mild leukocytosis of 13.2, normal lactic acid level.  BNP normal.  Chest x-ray showed left lower lobe opacity representing atelectasis versus airspace disease/pneumonia.  CT chest did not show any PE but showed left-sided airspace consolidation, mild moderate-sized pleural effusion, moderate-sized hiatal hernia.  Patient was started on antibiotics for the management of  pneumonia.  Speech therapy consulted, started on dysphagia 2 diet.  Remains on room air.  Plan for discharge back to nursing facility today.  Following problems were addressed during the hospitalization:  Left lower lobe pneumonia: recent history of RSV infection.  Chest x-ray/CT chest showing left-sided consolidation with pleural effusion.  Features suggesting aspiration. Currently he is on room air.  Continue current antibiotics.  Speech therapy consulted.  Currently on dysphagia 2 diet CT imaging showed retained mucus and/or debris within the left mainstem bronchus ,moderate size left lower lobe airspace consolidation,Small to moderate sized left pleural effusion. But patient remains comfortable on room air not coughing. Had mild leucocytosis,now resolved. Continue Augmentin for 5 days on discharge.  Also started on Protonix  for finding some features of esophagitis on imaging.  Type 2 diabetes: On metformin at home.  A1c of 6.6   Hypertension: On Lasix.  Monitor blood pressure   History of BPH: On finasteride, Myrbetriq, tamsulosin.  On chronic indwelling Foley catheter.  Follows with urology   Constipation: continue bowel regimen   History of GAD: On Lexapro, lorazepam, lithium   History of glaucoma: Continue home eyedrops   History of insomnia: On temazepam, trazodone   History of seizure disorder: Continue antiseizure medications   Memory impairment: Continue delirium precaution.  Frequent reorientation.  Currently remains alert, oriented.   Debility/deconditioning: Lives at nursing facility.  Walks with help of walker  Discharge Diagnoses:  Principal Problem:   Left lower lobe pneumonia Active Problems:   Diabetic neuropathy ([place])   Essential hypertension   Seizure ([facility])   BPH (benign prostatic hyperplasia)   Constipation   Generalized anxiety disorder   Glaucoma   Memory impairment   Primary insomnia   Type 2 diabetes mellitus with other specified complication [facility])    Discharge Instructions  Discharge Instructions     Diet general   Complete by: As directed    Dysphagia 2 diet   Discharge instructions   Complete by: As directed    1)Please take prescribed medications as instructed 2)Follow up with your PCP in a week.   Increase activity slowly   Complete by: As directed       Allergies as of 07/18/2022   No Known Allergies      Medication List     STOP taking these medications    LACTOBACILLUS PO       TAKE these medications    acetaminophen 500 MG tablet Commonly known as: TYLENOL Take 2 tablets (1,000 mg total) by mouth every 8 (eight) hours as  needed. What changed:  when to take this reasons to take this   amoxicillin-clavulanate 875-125 MG tablet Commonly known as: AUGMENTIN Take 1 tablet by mouth every 12 (twelve) hours for 5 days.   diclofenac  Sodium 1 % Gel Commonly known as: VOLTAREN Apply 2 g topically in the morning and at bedtime. Apply 2 grams to right foot toes, left hip, both knees, and left shoulder TWO times a day   docusate sodium 100 MG capsule Commonly known as: COLACE Take 100 mg by mouth 2 (two) times daily.   escitalopram 20 MG tablet Commonly known as: LEXAPRO Take 20 mg by mouth every morning.   ferrous sulfate 325 (65 FE) MG tablet Take 325 mg by mouth daily.   finasteride 5 MG tablet Commonly known as: PROSCAR Take 5 mg by mouth every morning.   furosemide 20 MG tablet Commonly known as: LASIX Take 20-40 mg by mouth See admin instructions. Take 40 mg by mouth in the morning and 20 mg in the evening   latanoprost 0.005 % ophthalmic solution Commonly known as: XALATAN Place 1 drop into both eyes every evening.   lithium carbonate 150 MG capsule Take 150 mg by mouth at bedtime.   LORazepam 1 MG tablet Commonly known as: ATIVAN Take 1 mg by mouth 2 (two) times daily.   metFORMIN 500 MG tablet Commonly known as: GLUCOPHAGE Take 500 mg by mouth 2 (two) times daily.   mirabegron ER 25 MG Tb24 tablet Commonly known as: MYRBETRIQ Take 25 mg by mouth in the morning.   nystatin powder Generic drug: nystatin Apply 1 Application topically 3 (three) times daily as needed (to groin, for yeast).   pantoprazole 40 MG tablet Commonly known as: PROTONIX Take 1 tablet (40 mg total) by mouth daily. Start taking on: July 19, 2022   polyethylene glycol 17 g packet Commonly known as: MIRALAX / GLYCOLAX Take 17 g by mouth daily as needed for mild constipation. What changed: reasons to take this   senna 8.6 MG Tabs tablet Commonly known as: SENOKOT Take 1 tablet by mouth daily.   sodium chloride 0.65 % Soln nasal spray Commonly known as: OCEAN Place 1 spray into both nostrils 2 (two) times daily.   tamsulosin 0.4 MG Caps capsule Commonly known as: FLOMAX Take 0.4 mg by mouth every evening.    temazepam 15 MG capsule Commonly known as: RESTORIL Take 15 mg by mouth at bedtime.   timolol 0.5 % ophthalmic solution Commonly known as: TIMOPTIC Place 1 drop into both eyes every evening.   traZODone 100 MG tablet Commonly known as: DESYREL Take 100 mg by mouth at bedtime.   vitamin B-12 100 MCG tablet Commonly known as: CYANOCOBALAMIN Take 100 mcg by mouth every morning.   Vitamin D3 50 MCG (2000 UT) Tabs Generic drug: Cholecalciferol Take 2,000 Units by mouth every morning.   zinc oxide 20 % ointment Apply 1 application. topically See admin instructions. Apply topically to buttocks every shift        No Known Allergies  Consultations: None   Procedures/Studies: DG Swallowing Func-Speech Pathology  Result Date: 07/17/2022 Table formatting from the original result was not included. Modified Barium Swallow Study Patient Details Name: Neri R Ayers MRN: [id number] Date of Birth: Dec 18, 1932 Today's Date: 07/17/2022 HPI/PMH: HPI: Patient is an 89 y.o. male with PMH: anemia, osteoarthritis, constipation, generalized anxiety disoder, glaucoma, HTN, insomnia, memory impairment, PNA, SBO, CVA, DM-2. He resides at ALF [facility]). Patient was diagnosed with RSV about  a week prior to this admission and has had persistent cough since then. He presented to [facility] on 07/16/22 with left lower chest wall pain. Portable chest radiograph showed left lower lung opacity; CT angio/chestPE negative for PE but did show retained mucous and or debris in left mainstem bronchus with subtotal filling of left lower lobe bronchi, Moderate-sized  left lower lobe airspace consolidation, moderate sized hiatal hernia. SLP swallow evaluation ordered due to patient coughing with meds when taken with both water and with puree (applesauce). Clinical Impression: Clinical Impression: Patient presents with what is likely a chronic dysphagia with significant structural impact. His cervical vertebrae are curved,  causing reduced pharyngeal space. When epiglottis was in the process of inverting, it came into contact with pharyngeal wall which prevented full inversion. This led to thin liquid barium entering laryngeal vestibule with silent and sensed aspiration occuring during and after the swallow was completed. Patient exhibited delays in swallow initiation with all barium consistencies at level of the vallecula and with honey and nectar thick liquids, delay at posterior laryngeal surface of epiglottis. No significant retention of barium observed in pharynx with puree solids but mild amount of barium residual observed with thin, nectar thick, honey thick liquids within vallecula and trace in pyriform sinus. and posterior pharyngeal wall. Patient was cued to swallow again to clear residuals, however he did not have adequate sensation and would tell SLP, "there is nothing to swallow". Chin tuck posture with thin liquids did effectively prevent aspiration during the swallow but penetration above the vocal cords did occur and aspiration after the swallow from pharyngeal residuals still occured. Patient was able to clear some penetrate with cued cough, however this was not effective to clear aspirate. Barium tablet (61m) taken with puree solids, transited through pharynx and upper esophagus without difficulty. SLP recommending Dys 2 (minced/fine chop) solids and nectar thick liquids. Factors that may increase risk of adverse event in presence of aspiration [place] Factors that may increase risk of adverse event in presence of aspiration ([place]): Poor general health and/or compromised immunity; Reduced cognitive function; Frail or deconditioned Recommendations/Plan: Swallowing Evaluation Recommendations Swallowing Evaluation Recommendations Recommendations: PO diet PO Diet Recommendation: Dysphagia 2 (Finely chopped); Mildly thick liquids (Level 2, nectar thick) Liquid Administration via: Straw;  Cup Medication Administration: Other (Comment) (whole or crushed in puree) Supervision: Intermittent supervision/cueing for swallowing strategies; Set-up assistance for safety Swallowing strategies  : Slow rate; Small bites/sips; Multiple dry swallows after each bite/sip; Clear throat intermittently; Follow solids with liquids Postural changes: Position pt fully upright for meals; Stay upright 30-60 min after meals Oral care recommendations: Oral care BID (2x/day) Caregiver Recommendations: Avoid jello, ice cream, thin soups, popsicles; Remove water pitcher; Have oral suction available Treatment Plan Treatment Plan Treatment recommendations: Therapy as outlined in treatment plan below Follow-up recommendations: Other (comment) (SNF or HH depending on discharge plans) Functional status assessment: Patient has had a recent decline in their functional status and demonstrates the ability to make significant improvements in function in a reasonable and predictable amount of time. Treatment frequency: Min 2x/week Treatment duration: 1 week Interventions: Aspiration precaution training; Trials of upgraded texture/liquids; Diet toleration management by SLP; Patient/family education Recommendations Recommendations for follow up therapy are one component of a multi-disciplinary discharge planning process, led by the attending physician.  Recommendations may be updated based on patient status, additional functional criteria and insurance authorization. Assessment: Orofacial Exam: Orofacial Exam Oral Cavity: Oral Hygiene: WFL Oral Cavity - Dentition: Dentures,  top; Adequate natural dentition Orofacial Anatomy: WFL Oral Motor/Sensory Function: WFL Anatomy: Anatomy: Other (Comment) (significant curvature of cervical spine) Thin Liquids: Thin Liquids (Level 0) Thin Liquids : Impaired Bolus delivery method: Cup; Straw; Spoon Thin Liquid - Impairment: Pharyngeal impairment Initiation of swallow : Valleculae Soft palate elevation: No  bolus between soft palate (SP)/pharyngeal wall (PW) Laryngeal elevation: Complete superior movement of thyroid cartilage with complete approximation of arytenoids to epiglottic petiole Anterior hyoid excursion: Complete Epiglottic movement: Partial Laryngeal vestibule closure: Incomplete, narrow column air/contrast in laryngeal vestibule Pharyngeal stripping wave : Present - diminished Pharyngeal contraction (A/P view only): N/A Pharyngoesophageal segment opening: Partial distention/partial duration, partial obstruction of flow Tongue base retraction: Narrow column of contrast or air between tongue base and PPW Pharyngeal residue: Collection of residue within or on pharyngeal structures Location of pharyngeal residue: Valleculae; Pharyngeal wall; Pyriform sinuses; Tongue base Penetration/Aspiration Scale (PAS) score: 8.  Material enters airway, passes BELOW cords without attempt by patient to eject out (silent aspiration); 7.  Material enters airway, passes BELOW cords and not ejected out despite cough attempt by patient  Mildly Thick Liquids: Mildly thick liquids (Level 2, nectar thick) Mildly thick liquids (Level 2, nectar thick): Impaired Bolus delivery method: Cup; Spoon Mildly Thick Liquid - Impairment: Pharyngeal impairment Initiation of swallow : Valleculae; Posterior laryngeal surface of the epiglottis Soft palate elevation: No bolus between soft palate (SP)/pharyngeal wall (PW) Laryngeal elevation: Complete superior movement of thyroid cartilage with complete approximation of arytenoids to epiglottic petiole Anterior hyoid excursion: Complete Epiglottic movement: Partial Laryngeal vestibule closure: Complete, no air/contrast in laryngeal vestibule Pharyngeal stripping wave : Present - diminished Pharyngeal contraction (A/P view only): N/A Pharyngoesophageal segment opening: Partial distention/partial duration, partial obstruction of flow Tongue base retraction: Trace column of contrast or air between tongue  base and PPW Pharyngeal residue: Collection of residue within or on pharyngeal structures Location of pharyngeal residue: Valleculae; Tongue base; Pyriform sinuses; Pharyngeal wall; Aryepiglottic folds Penetration/Aspiration Scale (PAS) score: 1.  Material does not enter airway  Moderately Thick Liquids: Moderately thick liquids (Level 3, honey thick) Moderately thick liquids (Level 3, honey thick): Impaired Bolus delivery method: Spoon Moderately Thick Liquid - Impairment: Pharyngeal impairment Initiation of swallow : Valleculae; Posterior laryngeal surface of the epiglottis Soft palate elevation: No bolus between soft palate (SP)/pharyngeal wall (PW) Laryngeal elevation: Complete superior movement of thyroid cartilage with complete approximation of arytenoids to epiglottic petiole Anterior hyoid excursion: Complete Epiglottic movement: Partial Laryngeal vestibule closure: Complete, no air/contrast in laryngeal vestibule Pharyngeal stripping wave : Present - diminished Pharyngeal contraction (A/P view only): N/A Pharyngoesophageal segment opening: Partial distention/partial duration, partial obstruction of flow Tongue base retraction: Trace column of contrast or air between tongue base and PPW Pharyngeal residue: Collection of residue within or on pharyngeal structures Location of pharyngeal residue: Valleculae; Pharyngeal wall Penetration/Aspiration Scale (PAS) score: 1.  Material does not enter airway  Puree: Puree Puree: WFL Solid: Solid Solid: Impaired Solid - Impairment: Oral Impairment (patient reporting he cannot chew graham cracker) Pill: Pill Pill: WFL Consistency administered : puree Compensatory Strategies: Compensatory Strategies Compensatory strategies: Yes Chin tuck: Effective Effective Chin Tuck: Thin liquid (Level 0)   General Information: Caregiver present: No  Diet Prior to this Study: Regular; Thin liquids (Level 0)   Temperature : Normal   Respiratory Status: WFL   Supplemental O2: None (Room  air)   History of Recent Intubation: No  Behavior/Cognition: Alert; Cooperative; Pleasant mood Self-Feeding Abilities: Able to self-feed; Needs set-up for self-feeding Baseline vocal  quality/speech: Normal Volitional Cough: Able to elicit Volitional Swallow: Able to elicit Exam Limitations: No limitations Goal Planning: Prognosis for improved oropharyngeal function: Fair Barriers to Reach Goals: Time post onset No data recorded Patient/Family Stated Goal: nothing specific Consulted and agree with results and recommendations: Patient Pain: Pain Assessment Pain Assessment: No/denies pain End of Session: Start Time:SLP Start Time (ACUTE ONLY): 1210 Stop Time: SLP Stop Time (ACUTE ONLY): 1230 Time Calculation:SLP Time Calculation (min) (ACUTE ONLY): 20 min Charges: SLP Evaluations $ SLP Speech Visit: 1 Visit SLP Evaluations $BSS Swallow: 1 Procedure $MBS Swallow: 1 Procedure SLP visit diagnosis: SLP Visit Diagnosis: Dysphagia, pharyngeal phase (R13.13) Past Medical History: Past Medical History: Diagnosis Date  Anemia   Arthritis   Constipation 07/16/2022  Generalized anxiety disorder   Glaucoma   History of kidney stones   Hypertension   Inguinal hernia   Insomnia   Memory impairment 07/16/2022  Pneumonia   July 2023  Primary insomnia 07/16/2022  Prostatism   S/p left hip fracture 02/04/2021  SBO (small bowel obstruction) ([place]) 07/26/2020  Seizure ([place])   Stroke ([place])   Type 2 diabetes mellitus with other specified complication ([place]) [id number] Past Surgical History: Past Surgical History: Procedure Laterality Date  CATARACT EXTRACTION, BILATERAL    FEMUR IM NAIL Left 02/05/2021  Procedure: INTRAMEDULLARY (IM) NAIL FEMORAL;  Surgeon: Renette Butters, MD;  Location: [facility];  Service: Orthopedics;  Laterality: Left;  glaucoma    HIP SURGERY Left   INGUINAL HERNIA REPAIR Left 02/08/2022  Procedure: LEFT HERNIA REPAIR INGUINAL ADULT;  Surgeon: Clovis Riley, MD;  Location: [facility];  Service: General;  Laterality: Left;  Sonia Baller, MA, CCC-SLP Speech Therapy   CT ABDOMEN PELVIS W CONTRAST  Result Date: 07/16/2022 CLINICAL DATA:  Abdominal pain, acute. Technologist notes state RSV. EXAM: CT ABDOMEN AND PELVIS WITH CONTRAST TECHNIQUE: Multidetector CT imaging of the abdomen and pelvis was performed using the standard protocol following bolus administration of intravenous contrast. RADIATION DOSE REDUCTION: This exam was performed according to the departmental dose-optimization program which includes automated exposure control, adjustment of the mA and/or kV according to patient size and/or use of iterative reconstruction technique. CONTRAST:  166m OMNIPAQUE IOHEXOL 350 MG/ML SOLN COMPARISON:  CT 11/25/2021 FINDINGS: Lower chest: Assessed on concurrent chest CTA, reported separately Hepatobiliary: No focal liver abnormality is seen. No gallstones, gallbladder wall thickening, or biliary dilatation. Pancreas: Parenchymal atrophy. No ductal dilatation or inflammation. Spleen: Normal in size. Stable subcentimeter hypodensity in the anterior spleen, typically benign. Adrenals/Urinary Tract: No adrenal nodule. No hydronephrosis, perinephric edema or focal renal abnormality. Symmetric excretion on delayed phase imaging. There is a Foley catheter in the urinary bladder, however the bladder is not decompressed. There is moderate diffuse bladder wall thickening. Stomach/Bowel: Moderate-sized hiatal hernia. There is mild irregular wall thickening of the herniated stomach in the lower thorax. Fluid-filled loops of small bowel in the pelvis but no evidence of inflammation or obstruction. The appendix is normal. Moderate stool in the proximal colon. Left colonic diverticulosis without diverticulitis. Vascular/Lymphatic: Prominent aortic and branch atherosclerosis. No aortic aneurysm. Patent portal vein. No abdominopelvic adenopathy. Reproductive: Prostate is unremarkable. Other: Prior left inguinal hernia repair. No ascites or free air.  Musculoskeletal: Chronic T12 compression deformity. Postsurgical change of the left proximal femur. Scoliosis and degenerative change in the spine. No acute osseous findings. IMPRESSION: 1. Moderate-sized hiatal hernia. Mild irregular wall thickening of the herniated stomach in the lower thorax, suspicious for gastritis. 2. Moderate diffuse bladder wall thickening, which  may be due to nondistention, however recommend correlation with urinalysis to exclude urinary tract infection. 3. Left colonic diverticulosis without diverticulitis. Aortic Atherosclerosis (ICD10-I70.0). Electronically Signed   By: Keith Rake M.D.   On: 07/16/2022 16:26   CT Angio Chest PE W and/or Wo Contrast  Result Date: 07/16/2022 CLINICAL DATA:  Pulmonary embolism (PE) suspected, low to intermediate prob, positive D-dimer Technologist note states RSV EXAM: CT ANGIOGRAPHY CHEST WITH CONTRAST TECHNIQUE: Multidetector CT imaging of the chest was performed using the standard protocol during bolus administration of intravenous contrast. Multiplanar CT image reconstructions and MIPs were obtained to evaluate the vascular anatomy. Performed in conjunction with CT of the abdomen and pelvis, reported separately. RADIATION DOSE REDUCTION: This exam was performed according to the departmental dose-optimization program which includes automated exposure control, adjustment of the mA and/or kV according to patient size and/or use of iterative reconstruction technique. CONTRAST:  137m OMNIPAQUE IOHEXOL 350 MG/ML SOLN COMPARISON:  Chest radiograph earlier today. FINDINGS: Cardiovascular: There are no filling defects within the pulmonary arteries to suggest pulmonary embolus. Moderate aortic atherosclerosis without aneurysm or acute aortic findings. The heart is upper normal in size. No pericardial effusion. Mediastinum/Nodes: Moderate-sized hiatal hernia. There is wall thickening of the distal esophagus. 11 mm anterior paratracheal node. Mild left  hilar adenopathy measuring up to 12 mm. Shotty right hilar lymph nodes. Lungs/Pleura: Retained mucus and/or debris within the left mainstem bronchus with subtotal filling of the left lower lobe bronchi. There is filling of the segmental right lower lobe bronchi. Moderate-sized left lower lobe airspace consolidation. Small to moderate size left pleural effusion, partially loculated tracking into the fissure. Peripheral/dependent airspace disease in the right lower lobe with faint tree-in-bud opacities. There is no right pleural effusion. Upper Abdomen: Assessed on concurrent abdominal CT, reported separately. Musculoskeletal: Exaggerated thoracic kyphosis. The bones are subjectively under mineralized. Mild T12 superior endplate compression deformity is chronic and unchanged from prior abdominal CT. Review of the MIP images confirms the above findings. IMPRESSION: 1. No pulmonary embolus. 2. Retained mucus and/or debris within the left mainstem bronchus with subtotal filling of the left lower lobe bronchi. Moderate-sized left lower lobe airspace consolidation. Small to moderate size left pleural effusion, partially loculated tracking into the fissure. 3. Segmental bronchial filling in the right lower lobe. Peripheral/dependent airspace disease in the right lower lobe with faint tree-in-bud opacities. 4. Overall findings may represent infection or aspiration sequela. Recommend correlation with aspiration risk factors. 5. Moderate-sized hiatal hernia. Distal esophageal wall thickening, can be seen with reflux or esophagitis. 6. Mild mediastinal and left hilar adenopathy is likely reactive in this setting. Aortic Atherosclerosis (ICD10-I70.0). Electronically Signed   By: MKeith RakeM.D.   On: 07/16/2022 16:08   DG Chest Portable 1 View  Result Date: 07/16/2022 CLINICAL DATA:  Shortness of breath and chest pain. EXAM: PORTABLE CHEST 1 VIEW COMPARISON:  None Available. FINDINGS: The cardiomediastinal silhouette  is unremarkable. LEFT LOWER lung opacity is noted and may represent atelectasis or airspace disease. Mild RIGHT basilar atelectasis is present. There is no evidence of pneumothorax or large pleural effusion. No acute bony abnormalities are present. IMPRESSION: LEFT LOWER lung opacity which may represent atelectasis or airspace disease/pneumonia. Radiographic follow-up to resolution is recommended. Mild RIGHT basilar atelectasis. Electronically Signed   By: JMargarette CanadaM.D.   On: 07/16/2022 13:40      Subjective: Patient seen and examined at bedside today.  Hemodynamically stable.  On room air.  Denies shortness of breath or cough.  Medically stable for discharge.  Discharge Exam: Vitals:   07/17/22 1921 07/18/22 0432  BP: 127/61 132/78  Pulse: 69 61  Resp: 17 17  Temp: 97.6 F (36.4 C) (!) 97.4 F (36.3 C)  SpO2: 100% 95%   Vitals:   07/17/22 1350 07/17/22 1651 07/17/22 1921 07/18/22 0432  BP: (!) 141/84  127/61 132/78  Pulse: (!) 54 (!) 103 69 61  Resp: '20  17 17  '$ Temp: 98.3 F (36.8 C)  97.6 F (36.4 C) (!) 97.4 F (36.3 C)  TempSrc:   Oral Oral  SpO2: 97% 94% 100% 95%  Weight:      Height:        General: Pt is alert, awake, not in acute distress,deconditioned Cardiovascular: RRR, S1/S2 +, no rubs, no gallops Respiratory: Mildly diminished air entry bilaterally, no wheezing, no rhonchi Abdominal: Soft, NT, ND, bowel sounds + Extremities: no edema, no cyanosis    The results of significant diagnostics from this hospitalization (including imaging, microbiology, ancillary and laboratory) are listed below for reference.     Microbiology: Recent Results (from the past 240 hour(s))  Blood culture (routine x 2)     Status: None (Preliminary result)   Collection Time: 07/16/22  3:16 PM   Specimen: BLOOD RIGHT FOREARM  Result Value Ref Range Status   Specimen Description   Final    BLOOD RIGHT FOREARM Performed at [facility], [address] [place], [place]  [address]    Special Requests   Final    BOTTLES DRAWN AEROBIC AND ANAEROBIC Blood Culture results may not be optimal due to an inadequate volume of blood received in culture bottles Performed at [facility] [address], [place], [place] [address]    Culture   Final    NO GROWTH 2 DAYS Performed at [facility] [address], [place], [place] [address]    Report Status PENDING  Incomplete  Blood culture (routine x 2)     Status: None (Preliminary result)   Collection Time: 07/16/22  4:29 PM   Specimen: BLOOD LEFT ARM  Result Value Ref Range Status   Specimen Description   Final    BLOOD LEFT ARM Performed at [facility], [place] [address] [place], [place] [address]    Special Requests   Final    BOTTLES DRAWN AEROBIC AND ANAEROBIC Blood Culture adequate volume Performed at [facility] [address] [place], [place] [address]    Culture   Final    NO GROWTH 2 DAYS Performed at [facility] [address], [place], [place] [address]    Report Status PENDING  Incomplete  Resp panel by RT-PCR (RSV, Flu A&B, Covid) Anterior Nasal Swab     Status: Abnormal   Collection Time: 07/16/22  4:34 PM   Specimen: Anterior Nasal Swab  Result Value Ref Range Status   SARS Coronavirus 2 by RT PCR NEGATIVE NEGATIVE Final    Comment: (NOTE) SARS-CoV-2 target nucleic acids are NOT DETECTED.  The SARS-CoV-2 RNA is generally detectable in upper respiratory specimens during the acute phase of infection. The lowest concentration of SARS-CoV-2 viral copies this assay can detect is 138 copies/mL. A negative result does not preclude SARS-Cov-2 infection and should not be used as the sole basis for treatment or other patient management decisions. A negative result may occur with  improper specimen collection/handling, submission of specimen other than nasopharyngeal swab, presence of viral mutation(s) within  the areas targeted by this  assay, and inadequate number of viral copies(<138 copies/mL). A negative result must be combined with clinical observations, patient history, and epidemiological information. The expected result is Negative.  Fact Sheet for Patients:  EntrepreneurPulse.com.au  Fact Sheet for Healthcare Providers:  IncredibleEmployment.be  This test is no t yet approved or cleared by the [place] FDA and  has been authorized for detection and/or diagnosis of SARS-CoV-2 by FDA under an Emergency Use Authorization (EUA). This EUA will remain  in effect (meaning this test can be used) for the duration of the COVID-19 declaration under Section 564(b)(1) of the Act, 21 U.S.C.section 360bbb-3(b)(1), unless the authorization is terminated  or revoked sooner.       Influenza A by PCR NEGATIVE NEGATIVE Final   Influenza B by PCR NEGATIVE NEGATIVE Final    Comment: (NOTE) The Xpert Xpress SARS-CoV-2/FLU/RSV plus assay is intended as an aid in the diagnosis of influenza from Nasopharyngeal swab specimens and should not be used as a sole basis for treatment. Nasal washings and aspirates are unacceptable for Xpert Xpress SARS-CoV-2/FLU/RSV testing.  Fact Sheet for Patients: EntrepreneurPulse.com.au  Fact Sheet for Healthcare Providers: IncredibleEmployment.be  This test is not yet approved or cleared by the [place] FDA and has been authorized for detection and/or diagnosis of SARS-CoV-2 by FDA under an Emergency Use Authorization (EUA). This EUA will remain in effect (meaning this test can be used) for the duration of the COVID-19 declaration under Section 564(b)(1) of the Act, 21 U.S.C. section 360bbb-3(b)(1), unless the authorization is terminated or revoked.     Resp Syncytial Virus by PCR POSITIVE (A) NEGATIVE Final    Comment: (NOTE) Fact Sheet for Patients: EntrepreneurPulse.com.au  Fact Sheet  for Healthcare Providers: IncredibleEmployment.be  This test is not yet approved or cleared by the [place] FDA and has been authorized for detection and/or diagnosis of SARS-CoV-2 by FDA under an Emergency Use Authorization (EUA). This EUA will remain in effect (meaning this test can be used) for the duration of the COVID-19 declaration under Section 564(b)(1) of the Act, 21 U.S.C. section 360bbb-3(b)(1), unless the authorization is terminated or revoked.  Performed at [facility], [place] [address], [place], [place] [address]      Labs: BNP (last 3 results) Recent Labs    07/16/22 1329  BNP 99991111*   Basic Metabolic Panel: Recent Labs  Lab 07/16/22 1329 07/17/22 0427  NA 134* 132*  K 4.3 4.0  CL 98 98  CO2 25 26  GLUCOSE 158* 117*  BUN 15 15  CREATININE 1.01 0.90  CALCIUM 8.6* 8.8*   Liver Function Tests: Recent Labs  Lab 07/16/22 1329 07/17/22 0427  AST 17 13*  ALT 11 9  ALKPHOS 138* 135*  BILITOT 0.7 0.4  PROT 6.8 6.9  ALBUMIN 2.7* 2.5*   Recent Labs  Lab 07/16/22 1329  LIPASE 39   No results for input(s): "AMMONIA" in the last 168 hours. CBC: Recent Labs  Lab 07/16/22 1329 07/17/22 0427 07/18/22 0545  WBC 13.2* 12.6* 9.9  NEUTROABS 9.7*  --   --   HGB 9.7* 9.5* 9.1*  HCT 30.0* 29.9* 28.3*  MCV 95.2 96.1 95.3  PLT 507* 470* 458*   Cardiac Enzymes: No results for input(s): "CKTOTAL", "CKMB", "CKMBINDEX", "TROPONINI" in the last 168 hours. BNP: Invalid input(s): "POCBNP" CBG: Recent Labs  Lab 07/17/22 1120 07/17/22 1641 07/17/22 2129 07/18/22 0739 07/18/22 1132  GLUCAP 206* 112* 144* 123* 127*   D-Dimer No  results for input(s): "DDIMER" in the last 72 hours. Hgb A1c Recent Labs    07/17/22 0427  HGBA1C 6.6*   Lipid Profile No results for input(s): "CHOL", "HDL", "LDLCALC", "TRIG", "CHOLHDL", "LDLDIRECT" in the last 72 hours. Thyroid function studies No results for input(s): "TSH",  "T4TOTAL", "T3FREE", "THYROIDAB" in the last 72 hours.  Invalid input(s): "FREET3" Anemia work up No results for input(s): "VITAMINB12", "FOLATE", "FERRITIN", "TIBC", "IRON", "RETICCTPCT" in the last 72 hours. Urinalysis    Component Value Date/Time   COLORURINE YELLOW 05/15/2021 1046   APPEARANCEUR HAZY (A) 05/15/2021 1046   LABSPEC 1.016 05/15/2021 1046   PHURINE 7.5 05/15/2021 1046   GLUCOSEU NEGATIVE 05/15/2021 1046   HGBUR MODERATE (A) 05/15/2021 1046   BILIRUBINUR NEGATIVE 05/15/2021 1046   KETONESUR NEGATIVE 05/15/2021 1046   PROTEINUR 30 (A) 05/15/2021 1046   NITRITE NEGATIVE 05/15/2021 1046   LEUKOCYTESUR LARGE (A) 05/15/2021 1046   Sepsis Labs Recent Labs  Lab 07/16/22 1329 07/17/22 0427 07/18/22 0545  WBC 13.2* 12.6* 9.9   Microbiology Recent Results (from the past 240 hour(s))  Blood culture (routine x 2)     Status: None (Preliminary result)   Collection Time: 07/16/22  3:16 PM   Specimen: BLOOD RIGHT FOREARM  Result Value Ref Range Status   Specimen Description   Final    BLOOD RIGHT FOREARM Performed at [facility], [place] [address], [place], [place] [address]    Special Requests   Final    BOTTLES DRAWN AEROBIC AND ANAEROBIC Blood Culture results may not be optimal due to an inadequate volume of blood received in culture bottles Performed at [facility] [address], [place], [place] [address]    Culture   Final    NO GROWTH 2 DAYS Performed at [facility] [address] [place], [place] [address]    Report Status PENDING  Incomplete  Blood culture (routine x 2)     Status: None (Preliminary result)   Collection Time: 07/16/22  4:29 PM   Specimen: BLOOD LEFT ARM  Result Value Ref Range Status   Specimen Description   Final    BLOOD LEFT ARM Performed at [facility], [place] [address], [place], [place] [address]    Special Requests   Final    BOTTLES DRAWN AEROBIC AND ANAEROBIC Blood Culture adequate  volume Performed at [facility] [address], [place], [place] [address]    Culture   Final    NO GROWTH 2 DAYS Performed at [facility] [address] [place], [place] [address]    Report Status PENDING  Incomplete  Resp panel by RT-PCR (RSV, Flu A&B, Covid) Anterior Nasal Swab     Status: Abnormal   Collection Time: 07/16/22  4:34 PM   Specimen: Anterior Nasal Swab  Result Value Ref Range Status   SARS Coronavirus 2 by RT PCR NEGATIVE NEGATIVE Final    Comment: (NOTE) SARS-CoV-2 target nucleic acids are NOT DETECTED.  The SARS-CoV-2 RNA is generally detectable in upper respiratory specimens during the acute phase of infection. The lowest concentration of SARS-CoV-2 viral copies this assay can detect is 138 copies/mL. A negative result does not preclude SARS-Cov-2 infection and should not be used as the sole basis for treatment or other patient management decisions. A negative result may occur with  improper specimen collection/handling, submission of specimen other than nasopharyngeal swab, presence of viral mutation(s) within the areas targeted by this assay,  and inadequate number of viral copies(<138 copies/mL). A negative result must be combined with clinical observations, patient history, and epidemiological information. The expected result is Negative.  Fact Sheet for Patients:  EntrepreneurPulse.com.au  Fact Sheet for Healthcare Providers:  IncredibleEmployment.be  This test is no t yet approved or cleared by the [place] FDA and  has been authorized for detection and/or diagnosis of SARS-CoV-2 by FDA under an Emergency Use Authorization (EUA). This EUA will remain  in effect (meaning this test can be used) for the duration of the COVID-19 declaration under Section 564(b)(1) of the Act, 21 U.S.C.section 360bbb-3(b)(1), unless the authorization is terminated  or revoked sooner.       Influenza  A by PCR NEGATIVE NEGATIVE Final   Influenza B by PCR NEGATIVE NEGATIVE Final    Comment: (NOTE) The Xpert Xpress SARS-CoV-2/FLU/RSV plus assay is intended as an aid in the diagnosis of influenza from Nasopharyngeal swab specimens and should not be used as a sole basis for treatment. Nasal washings and aspirates are unacceptable for Xpert Xpress SARS-CoV-2/FLU/RSV testing.  Fact Sheet for Patients: EntrepreneurPulse.com.au  Fact Sheet for Healthcare Providers: IncredibleEmployment.be  This test is not yet approved or cleared by the [place] FDA and has been authorized for detection and/or diagnosis of SARS-CoV-2 by FDA under an Emergency Use Authorization (EUA). This EUA will remain in effect (meaning this test can be used) for the duration of the COVID-19 declaration under Section 564(b)(1) of the Act, 21 U.S.C. section 360bbb-3(b)(1), unless the authorization is terminated or revoked.     Resp Syncytial Virus by PCR POSITIVE (A) NEGATIVE Final    Comment: (NOTE) Fact Sheet for Patients: EntrepreneurPulse.com.au  Fact Sheet for Healthcare Providers: IncredibleEmployment.be  This test is not yet approved or cleared by the [place] FDA and has been authorized for detection and/or diagnosis of SARS-CoV-2 by FDA under an Emergency Use Authorization (EUA). This EUA will remain in effect (meaning this test can be used) for the duration of the COVID-19 declaration under Section 564(b)(1) of the Act, 21 U.S.C. section 360bbb-3(b)(1), unless the authorization is terminated or revoked.  Performed at [facility], [place] [address], [place], [place] [address]     Please note: You were cared for by a hospitalist during your hospital stay. Once you are discharged, your primary care physician will handle any further medical issues. Please note that NO REFILLS for any discharge medications  will be authorized once you are discharged, as it is imperative that you return to your primary care physician (or establish a relationship with a primary care physician if you do not have one) for your post hospital discharge needs so that they can reassess your need for medications and monitor your lab values.    Time coordinating discharge: 40 minutes  SIGNED:   Shelly Coss, MD  [facility] 07/18/2022, 11:43 AM Pager [phone]  If 7PM-7AM, please contact night-coverage [email or web address] Password [email or web address]

## 2022-07-18 NOTE — Progress Notes (Signed)
Speech Language Pathology Treatment: Dysphagia  Patient Details Name: Russell Ayers MRN: [id number] DOB: 1933/01/04 Today's Date: 07/18/2022 Time: 1220-1250 SLP Time Calculation (min) (ACUTE ONLY): 30 min  Assessment / Plan / Recommendation Clinical Impression  Pt seen for skilled SLP to address dysphagia including reviewing MBS and discussing advised compensation strategies.    RN reports pt coughed with thin liquids after swallowing pills crushed.  She reports she then realized he was to be on thick liquids.    SLP observed pt with intake including thin water, italian ice, graham cracker moistened, applesauce and nectar thick juice. Pt with immediate cough post-swallow of thin water - delayed with chin tuck posture.  No indication of airway compromise with all other consistencies.    Recommend dys2/nectar and if pt is to consume thin liquids advised he consume tHIN WATER due to pH neutral status.   Per review of chart, pt also with ? Right lung mucus plugging in Sept 2023 - and he reports coughing with thin liquids - as well as being tongue tied.    His curvature of his spine appears to inhibit epiglottic deflection.  [place] SLP advised to strengthen cough and expectoration for airway protection.  Printed MBS report and made swallow signs placing them in pt chart.  Pt educated to recommendations but will need support for generalization.    HPI HPI: Patient is an 89 y.o. male with PMH: anemia, osteoarthritis, constipation, generalized anxiety disoder, glaucoma, HTN, insomnia, memory impairment, PNA, SBO, CVA, DM-2. He resides at ALF [facility] Patient was diagnosed with RSV about a week prior to this admission and has had persistent cough since then. He presented to [facility] on 07/16/22 with left lower chest wall pain. Portable chest radiograph showed left lower lung opacity; CT angio/chestPE negative for PE but did show retained mucous and or debris in left mainstem bronchus with subtotal  filling of left lower lobe bronchi, Moderate-sized  left lower lobe airspace consolidation, moderate sized hiatal hernia. SLP swallow evaluation ordered due to patient coughing with meds when taken with both water and with puree (applesauce).      SLP Plan  Continue with current plan of care      Recommendations for follow up therapy are one component of a multi-disciplinary discharge planning process, led by the attending physician.  Recommendations may be updated based on patient status, additional functional criteria and insurance authorization.    Recommendations  Diet recommendations: Dysphagia 2 (fine chop);Nectar-thick liquid Liquids provided via: Cup;Straw Medication Administration: Crushed with puree Supervision: Patient able to self feed Compensations: Minimize environmental distractions;Slow rate;Small sips/bites;Follow solids with liquid;Multiple dry swallows after each bite/sip;Other (Comment) (tuck chin with liquids) Postural Changes and/or Swallow Maneuvers: Seated upright 90 degrees;Upright 30-60 min after meal                Oral Care Recommendations: Oral care BID Follow Up Recommendations: Home health SLP SLP Visit Diagnosis: Dysphagia, pharyngeal phase (R13.13) Plan: Continue with current plan of care          Russell Lime, MS [place]  Russell Ayers  07/18/2022, 1:19 PM

## 2022-07-21 LAB — CULTURE, BLOOD (ROUTINE X 2)
Culture: NO GROWTH
Culture: NO GROWTH
Special Requests: ADEQUATE

## 2022-08-24 ENCOUNTER — Telehealth: Payer: Self-pay | Admitting: Internal Medicine

## 2022-08-24 NOTE — Telephone Encounter (Signed)
The patient needs to see an APP in order to get in expeditiously, as his referring doctor requests. Please let the patient know that ALL patients seen by our very skilled and experienced advanced practitioners are supervised by, and subsequently assigned to, GI physicians.

## 2022-08-24 NOTE — Telephone Encounter (Signed)
Good Morning Dr.Perry Doc of the Day 4/4---11:31 am    We received a Urgent referral for Anemia w/ positive hemocult , and my first availed with any providers its until 10/27/22.Marland KitchenPatient dont want to see a AP. Can you please advise [place] on how to proceed.  Thanks

## 2022-08-25 ENCOUNTER — Encounter: Payer: Self-pay | Admitting: Nurse Practitioner

## 2022-09-03 ENCOUNTER — Emergency Department (HOSPITAL_COMMUNITY): Payer: Medicare Other

## 2022-09-03 ENCOUNTER — Emergency Department (HOSPITAL_COMMUNITY)
Admission: EM | Admit: 2022-09-03 | Discharge: 2022-09-03 | Disposition: A | Discharge status: Skilled Nursing Facility | Payer: Medicare Other | Attending: Emergency Medicine | Admitting: Emergency Medicine

## 2022-09-03 ENCOUNTER — Other Ambulatory Visit: Payer: Self-pay

## 2022-09-03 DIAGNOSIS — N3090 Cystitis, unspecified without hematuria: Secondary | ICD-10-CM

## 2022-09-03 DIAGNOSIS — E871 Hypo-osmolality and hyponatremia: Secondary | ICD-10-CM | POA: Insufficient documentation

## 2022-09-03 DIAGNOSIS — R319 Hematuria, unspecified: Secondary | ICD-10-CM | POA: Diagnosis present

## 2022-09-03 DIAGNOSIS — N3091 Cystitis, unspecified with hematuria: Secondary | ICD-10-CM | POA: Insufficient documentation

## 2022-09-03 DIAGNOSIS — D649 Anemia, unspecified: Secondary | ICD-10-CM | POA: Diagnosis not present

## 2022-09-03 LAB — CBC WITH DIFFERENTIAL/PLATELET
Abs Immature Granulocytes: 0.02 10*3/uL (ref 0.00–0.07)
Basophils Absolute: 0 10*3/uL (ref 0.0–0.1)
Basophils Relative: 0 %
Eosinophils Absolute: 0.2 10*3/uL (ref 0.0–0.5)
Eosinophils Relative: 2 %
HCT: 27.6 % — ABNORMAL LOW (ref 39.0–52.0)
Hemoglobin: 8.6 g/dL — ABNORMAL LOW (ref 13.0–17.0)
Immature Granulocytes: 0 %
Lymphocytes Relative: 25 %
Lymphs Abs: 2.4 10*3/uL (ref 0.7–4.0)
MCH: 28.9 pg (ref 26.0–34.0)
MCHC: 31.2 g/dL (ref 30.0–36.0)
MCV: 92.6 fL (ref 80.0–100.0)
Monocytes Absolute: 0.6 10*3/uL (ref 0.1–1.0)
Monocytes Relative: 6 %
Neutro Abs: 6.3 10*3/uL (ref 1.7–7.7)
Neutrophils Relative %: 67 %
Platelets: 415 10*3/uL — ABNORMAL HIGH (ref 150–400)
RBC: 2.98 MIL/uL — ABNORMAL LOW (ref 4.22–5.81)
RDW: 14.7 % (ref 11.5–15.5)
WBC: 9.5 10*3/uL (ref 4.0–10.5)
nRBC: 0 % (ref 0.0–0.2)

## 2022-09-03 LAB — POC OCCULT BLOOD, ED: Fecal Occult Bld: NEGATIVE

## 2022-09-03 LAB — BASIC METABOLIC PANEL
Anion gap: 8 (ref 5–15)
BUN: 9 mg/dL (ref 8–23)
CO2: 26 mmol/L (ref 22–32)
Calcium: 8.5 mg/dL — ABNORMAL LOW (ref 8.9–10.3)
Chloride: 98 mmol/L (ref 98–111)
Creatinine, Ser: 0.67 mg/dL (ref 0.61–1.24)
GFR, Estimated: >60 mL/min (ref >60)
Glucose, Bld: 141 mg/dL — ABNORMAL HIGH (ref 70–99)
Potassium: 3.7 mmol/L (ref 3.5–5.1)
Sodium: 132 mmol/L — ABNORMAL LOW (ref 135–145)

## 2022-09-03 LAB — URINALYSIS, ROUTINE W REFLEX MICROSCOPIC
Bilirubin Urine: NEGATIVE
Glucose, UA: NEGATIVE mg/dL
Hgb urine dipstick: NEGATIVE
Ketones, ur: NEGATIVE mg/dL
Nitrite: NEGATIVE
Protein, ur: NEGATIVE mg/dL
Specific Gravity, Urine: 1.013 (ref 1.005–1.030)
WBC, UA: >50 WBC/hpf (ref 0–5)
pH: 5 (ref 5.0–8.0)

## 2022-09-03 MED ORDER — SODIUM CHLORIDE 0.9 % IV BOLUS
500.0000 mL | Freq: Once | INTRAVENOUS | Status: AC
  Administered 2022-09-03: 500 mL via INTRAVENOUS

## 2022-09-03 MED ORDER — CEPHALEXIN 500 MG PO CAPS: 500.0000 mg | ORAL_CAPSULE | Freq: Three times a day (TID) | ORAL | 0 refills | Status: DC

## 2022-09-03 NOTE — ED Provider Notes (Signed)
[place] EMERGENCY DEPARTMENT AT [facility] Provider Note   CSN: [id number] Arrival date & time: 09/03/22  1219     History  Chief Complaint  Patient presents with   Hematuria    Russell Ayers is a 89 y.o. male presenting today with concern for hematuria.  Patient tells me that he had a Foley catheter placed on Friday and has been having blood in the bag ever since.  Denies any pain.  No fevers or chills.  No back pain or history of nephrolithiasis.  No nausea or vomiting.  Patient is not on a blood thinner   Hematuria       Home Medications Prior to Admission medications   Medication Sig Start Date End Date Taking? Authorizing Provider  acetaminophen (TYLENOL) 500 MG tablet Take 2 tablets (1,000 mg total) by mouth every 8 (eight) hours as needed. 07/18/22   Burnadette Pop, MD  Cholecalciferol (VITAMIN D3) 50 MCG (2000 UT) TABS Take 2,000 Units by mouth every morning.    [provider]  diclofenac Sodium (VOLTAREN) 1 % GEL Apply 2 g topically in the morning and at bedtime. Apply 2 grams to right foot toes, left hip, both knees, and left shoulder TWO times a day    [provider]  docusate sodium (COLACE) 100 MG capsule Take 100 mg by mouth 2 (two) times daily.    [provider]  escitalopram (LEXAPRO) 20 MG tablet Take 20 mg by mouth every morning. 09/26/21   [provider]  ferrous sulfate 325 (65 FE) MG tablet Take 325 mg by mouth daily.    [provider]  finasteride (PROSCAR) 5 MG tablet Take 5 mg by mouth every morning. 09/30/18   [provider]  furosemide (LASIX) 20 MG tablet Take 20-40 mg by mouth See admin instructions. Take 40 mg by mouth in the morning and 20 mg in the evening 12/21/21   [provider]  latanoprost (XALATAN) 0.005 % ophthalmic solution Place 1 drop into both eyes every evening. 11/20/18   [provider]  lithium carbonate 150 MG capsule Take 150 mg by mouth at  bedtime.    [provider]  LORazepam (ATIVAN) 1 MG tablet Take 1 mg by mouth 2 (two) times daily. 09/24/21   [provider]  metFORMIN (GLUCOPHAGE) 500 MG tablet Take 500 mg by mouth 2 (two) times daily. 09/28/21   [provider]  mirabegron ER (MYRBETRIQ) 25 MG TB24 tablet Take 25 mg by mouth in the morning.    [provider]  nystatin powder Apply 1 Application topically 3 (three) times daily as needed (to groin, for yeast).    [provider]  pantoprazole (PROTONIX) 40 MG tablet Take 1 tablet (40 mg total) by mouth daily. 07/19/22   Burnadette Pop, MD  polyethylene glycol (MIRALAX / GLYCOLAX) 17 g packet Take 17 g by mouth daily as needed for mild constipation. Patient taking differently: Take 17 g by mouth daily as needed for mild constipation (to be mixed with 4-6 ounces of water). 02/08/21   Burnadette Pop, MD  senna (SENOKOT) 8.6 MG TABS tablet Take 1 tablet by mouth daily.    [provider]  sodium chloride (OCEAN) 0.65 % SOLN nasal spray Place 1 spray into both nostrils 2 (two) times daily.    [provider]  tamsulosin (FLOMAX) 0.4 MG CAPS capsule Take 0.4 mg by mouth every evening. 11/25/20   [provider]  temazepam (RESTORIL) 15  MG capsule Take 15 mg by mouth at bedtime. 09/19/21   [provider]  timolol (TIMOPTIC) 0.5 % ophthalmic solution Place 1 drop into both eyes every evening. 08/27/21   [provider]  traZODone (DESYREL) 100 MG tablet Take 100 mg by mouth at bedtime.    [provider]  vitamin B-12 (CYANOCOBALAMIN) 100 MCG tablet Take 100 mcg by mouth every morning.    [provider]  zinc oxide 20 % ointment Apply 1 application. topically See admin instructions. Apply topically to buttocks every shift    [provider]      Allergies    Patient has no known allergies.    Review of Systems   Review of Systems  Genitourinary:  Positive for hematuria.  Negative for dysuria, frequency, penile pain, penile swelling, scrotal swelling and testicular pain.    Physical Exam Updated Vital Signs BP 116/63 (BP Location: Left Arm)   Pulse 60   Temp 98.2 F (36.8 C) (Oral)   SpO2 96%  Physical Exam Vitals and nursing note reviewed.  Constitutional:      Appearance: Normal appearance.  HENT:     Head: Normocephalic and atraumatic.  Eyes:     General: No scleral icterus.    Conjunctiva/sclera: Conjunctivae normal.  Pulmonary:     Effort: Pulmonary effort is normal. No respiratory distress.  Abdominal:     General: Abdomen is flat.     Palpations: Abdomen is soft.     Tenderness: There is no abdominal tenderness. There is no right CVA tenderness or left CVA tenderness.  Genitourinary:    Comments: Small blood clot in the patient's foreskin, no signs of trauma.  No infection surrounding the meatus Skin:    Findings: No rash.  Neurological:     Mental Status: He is alert.  Psychiatric:        Mood and Affect: Mood normal.     ED Results / Procedures / Treatments   Labs (all labs ordered are listed, but only abnormal results are displayed) Labs Reviewed  URINALYSIS, ROUTINE W REFLEX MICROSCOPIC - Abnormal; Notable for the following components:      Result Value   APPearance HAZY (*)    Leukocytes,Ua LARGE (*)    Bacteria, UA RARE (*)    All other components within normal limits  CBC WITH DIFFERENTIAL/PLATELET - Abnormal; Notable for the following components:   RBC 2.98 (*)    Hemoglobin 8.6 (*)    HCT 27.6 (*)    Platelets 415 (*)    All other components within normal limits  BASIC METABOLIC PANEL - Abnormal; Notable for the following components:   Sodium 132 (*)    Glucose, Bld 141 (*)    Calcium 8.5 (*)    All other components within normal limits  URINE CULTURE  POC OCCULT BLOOD, ED    EKG None  Radiology US Renal  Result Date: 09/03/2022 CLINICAL DATA:  Hematuria EXAM: RENAL / URINARY TRACT ULTRASOUND COMPLETE  COMPARISON:  CT abdomen and pelvis with contrast July 16, 2022 FINDINGS: Right Kidney: Renal measurements: 8.8 x 4.0 x 4.8 cm = volume: 87 mL. Echogenicity within normal limits. No mass or hydronephrosis visualized. Left Kidney: Renal measurements: 10.9 x 4.8 x 5.6 cm = volume: 151.2 mL. Echogenicity within normal limits. No mass or hydronephrosis visualized. Bladder: Decompressed with Foley catheter in place Other: None. IMPRESSION: Normal sonographic appearance of bilateral kidneys. Electronically Signed   By: Jacob Moores M.D.   On: 09/03/2022  12:44    Procedures Procedures   Medications Ordered in ED Medications  sodium chloride 0.9 % bolus 500 mL (0 mLs Intravenous Stopped 09/03/22 1421)    ED Course/ Medical Decision Making/ A&P Clinical Course as of 09/03/22 1309  Sun Sep 03, 2022  1309 Daughter requesting eval for GI bleed 2/2 dark stools [MR]    Clinical Course User Index [MR] Saddie Benders, PA-C                             Medical Decision Making Amount and/or Complexity of Data Reviewed Labs: ordered. Radiology: ordered.  Risk Prescription drug management.   89 year old male presenting today with hematuria.  Has been going on ever since he had a Foley catheter placed on Friday.  Differential includes but is not limited to traumatic from placement, cystitis, pyelonephritis, nephrolithiasis, CKD, HTN.  This is not an exhaustive differential.    Past Medical History / Co-morbidities / Social History: BPH, indwelling Foley catheter being managed at his facility   Additional history: Per chart review patient had hematuria after Foley catheter was placed in 2022.  He was admitted and discharged to the hospital in January for a pneumonia.  There were no mentions of a Foley catheter then.  I am unable to see any visits with the PCP, urologist or any other specialist since his discharge   Physical Exam: Pertinent physical exam findings include Unremarkable  Lab  Tests: I ordered, and personally interpreted labs.  The pertinent results include: Unremarkable. Urinalysis with WBCs, bacteria and leukocytes.  Also with RBCs Hemoglobin 8.6, was 9.1 a month ago.   Normal Hemoccult   Imaging Studies: I ordered and independently visualized and interpreted renal ultrasound and I agree with the radiologist that within normal limits  MDM/Disposition: This is a 89 year old male presenting today with hematuria.  Had a Foley catheter replaced on Friday and reports that there has been blood in the bag ever since.  On my exam, there is no blood in his urine.  Lab work revealing of leukocyte positive bacteriuria that I will treat with Keflex.  Also had a mild hyponatremia of 132 and he was given a small bolus of normal saline.  Also had a hemoglobin of 8.6, previously 9.1.  This is not a large drop in hemoglobin however daughter at bedside would like [place] to check him for GI bleed.  Hemoccult is negative.  No palpitations, shortness of breath or lightheadedness.  Do not believe this is significant or related to his hematuria.  Renal ultrasound also within normal limits.  Given patient does not have any clear signs of gross hematuria, symptoms of anemia, pain or abnormalities on ultrasound I believe he is stable to be discharged back to his facility.  He may follow-up with urology.  Started on Keflex.    I discussed this case with my attending physician Dr. Elpidio Anis who cosigned this note including patient's presenting symptoms, physical exam, and planned diagnostics and interventions. Attending physician stated agreement with plan or made changes to plan which were implemented.      Final Clinical Impression(s) / ED Diagnoses Final diagnoses:  Cystitis  Hematuria, unspecified type    Rx / DC Orders ED Discharge Orders          Ordered    cephALEXin (KEFLEX) 500 MG capsule  3 times daily        09/03/22 1253  Results and diagnoses were explained to the  patient. Return precautions discussed in full. Patient had no additional questions and expressed complete understanding.   This chart was dictated using voice recognition software.  Despite best efforts to proofread,  errors can occur which can change the documentation meaning.    Woodroe Chen 09/03/22 1519    Mardene Sayer, MD 09/03/22 1759

## 2022-09-03 NOTE — ED Triage Notes (Signed)
Pt arrived via [facility] from [facility]. He is here because he has had blood in his urine since Friday when hia catheter was changed. He is currently recovering from pneumonia and on his last day of antibiotics.   BP 136/78 HR 78 O2 97 CBG 216 T 98.6

## 2022-09-03 NOTE — Discharge Instructions (Signed)
You came to the emergency department today with blood in your urine.  You do appear to have a mild urinary tract infection so we are starting you on an antibiotic.  Your ultrasound was normal.  We do believe he should follow-up with the urologist for assessment of your prostate and the catheter that is being managed by your facility.  Call Dr. Dimas Millin office for an appointment.  Do not hesitate to return to the emergency department with any worsening symptoms.  It was a pleasure to meet you and we hope you feel better!

## 2022-09-04 LAB — URINE CULTURE: Culture: NO GROWTH

## 2022-09-30 ENCOUNTER — Emergency Department (HOSPITAL_COMMUNITY): Payer: Medicare Other

## 2022-09-30 ENCOUNTER — Other Ambulatory Visit: Payer: Self-pay

## 2022-09-30 ENCOUNTER — Inpatient Hospital Stay (HOSPITAL_COMMUNITY)
Admission: EM | Admit: 2022-09-30 | Discharge: 2022-10-02 | DRG: 698 | Disposition: A | Discharge status: Nursing Home (Includes ICF) | Payer: Medicare Other | Source: Skilled Nursing Facility | Attending: Family Medicine | Admitting: Family Medicine

## 2022-09-30 DIAGNOSIS — T83518A Infection and inflammatory reaction due to other urinary catheter, initial encounter: Principal | ICD-10-CM | POA: Diagnosis present

## 2022-09-30 DIAGNOSIS — E1169 Type 2 diabetes mellitus with other specified complication: Secondary | ICD-10-CM | POA: Diagnosis present

## 2022-09-30 DIAGNOSIS — E860 Dehydration: Secondary | ICD-10-CM | POA: Diagnosis present

## 2022-09-30 DIAGNOSIS — Z66 Do not resuscitate: Secondary | ICD-10-CM | POA: Diagnosis present

## 2022-09-30 DIAGNOSIS — N400 Enlarged prostate without lower urinary tract symptoms: Secondary | ICD-10-CM | POA: Diagnosis present

## 2022-09-30 DIAGNOSIS — E871 Hypo-osmolality and hyponatremia: Secondary | ICD-10-CM | POA: Diagnosis not present

## 2022-09-30 DIAGNOSIS — Z8673 Personal history of transient ischemic attack (TIA), and cerebral infarction without residual deficits: Secondary | ICD-10-CM | POA: Diagnosis not present

## 2022-09-30 DIAGNOSIS — Z1152 Encounter for screening for COVID-19: Secondary | ICD-10-CM | POA: Diagnosis not present

## 2022-09-30 DIAGNOSIS — J869 Pyothorax without fistula: Secondary | ICD-10-CM | POA: Diagnosis present

## 2022-09-30 DIAGNOSIS — Z7984 Long term (current) use of oral hypoglycemic drugs: Secondary | ICD-10-CM | POA: Diagnosis not present

## 2022-09-30 DIAGNOSIS — Z79899 Other long term (current) drug therapy: Secondary | ICD-10-CM

## 2022-09-30 DIAGNOSIS — Z8249 Family history of ischemic heart disease and other diseases of the circulatory system: Secondary | ICD-10-CM | POA: Diagnosis not present

## 2022-09-30 DIAGNOSIS — N390 Urinary tract infection, site not specified: Secondary | ICD-10-CM

## 2022-09-30 DIAGNOSIS — R296 Repeated falls: Secondary | ICD-10-CM | POA: Diagnosis present

## 2022-09-30 DIAGNOSIS — R531 Weakness: Principal | ICD-10-CM

## 2022-09-30 DIAGNOSIS — Z809 Family history of malignant neoplasm, unspecified: Secondary | ICD-10-CM | POA: Diagnosis not present

## 2022-09-30 DIAGNOSIS — Z87891 Personal history of nicotine dependence: Secondary | ICD-10-CM

## 2022-09-30 DIAGNOSIS — E1140 Type 2 diabetes mellitus with diabetic neuropathy, unspecified: Secondary | ICD-10-CM | POA: Diagnosis present

## 2022-09-30 DIAGNOSIS — I10 Essential (primary) hypertension: Secondary | ICD-10-CM | POA: Diagnosis present

## 2022-09-30 DIAGNOSIS — D500 Iron deficiency anemia secondary to blood loss (chronic): Secondary | ICD-10-CM | POA: Diagnosis present

## 2022-09-30 DIAGNOSIS — Y846 Urinary catheterization as the cause of abnormal reaction of the patient, or of later complication, without mention of misadventure at the time of the procedure: Secondary | ICD-10-CM | POA: Diagnosis present

## 2022-09-30 DIAGNOSIS — T83511A Infection and inflammatory reaction due to indwelling urethral catheter, initial encounter: Secondary | ICD-10-CM

## 2022-09-30 DIAGNOSIS — N401 Enlarged prostate with lower urinary tract symptoms: Secondary | ICD-10-CM | POA: Diagnosis not present

## 2022-09-30 DIAGNOSIS — E1142 Type 2 diabetes mellitus with diabetic polyneuropathy: Secondary | ICD-10-CM | POA: Diagnosis not present

## 2022-09-30 DIAGNOSIS — H409 Unspecified glaucoma: Secondary | ICD-10-CM | POA: Diagnosis present

## 2022-09-30 LAB — LACTIC ACID, PLASMA
Lactic Acid, Venous: 2.2 mmol/L (ref 0.5–1.9)
Lactic Acid, Venous: 1 mmol/L (ref 0.5–1.9)

## 2022-09-30 LAB — COMPREHENSIVE METABOLIC PANEL
ALT: 8 U/L (ref 0–44)
AST: 19 U/L (ref 15–41)
Albumin: 2.4 g/dL — ABNORMAL LOW (ref 3.5–5.0)
Alkaline Phosphatase: 76 U/L (ref 38–126)
Anion gap: 7 (ref 5–15)
BUN: 12 mg/dL (ref 8–23)
CO2: 23 mmol/L (ref 22–32)
Calcium: 8.1 mg/dL — ABNORMAL LOW (ref 8.9–10.3)
Chloride: 96 mmol/L — ABNORMAL LOW (ref 98–111)
Creatinine, Ser: 0.83 mg/dL (ref 0.61–1.24)
GFR, Estimated: >60 mL/min (ref >60)
Glucose, Bld: 133 mg/dL — ABNORMAL HIGH (ref 70–99)
Potassium: 3.8 mmol/L (ref 3.5–5.1)
Sodium: 126 mmol/L — ABNORMAL LOW (ref 135–145)
Total Bilirubin: 0.5 mg/dL (ref 0.3–1.2)
Total Protein: 6.2 g/dL — ABNORMAL LOW (ref 6.5–8.1)

## 2022-09-30 LAB — URINALYSIS, W/ REFLEX TO CULTURE (INFECTION SUSPECTED)
Bilirubin Urine: NEGATIVE
Glucose, UA: NEGATIVE mg/dL
Hgb urine dipstick: NEGATIVE
Ketones, ur: NEGATIVE mg/dL
Nitrite: NEGATIVE
Protein, ur: NEGATIVE mg/dL
Specific Gravity, Urine: 1.006 (ref 1.005–1.030)
WBC, UA: >50 WBC/hpf (ref 0–5)
pH: 7 (ref 5.0–8.0)

## 2022-09-30 LAB — CBC WITH DIFFERENTIAL/PLATELET
Abs Immature Granulocytes: 0.03 10*3/uL (ref 0.00–0.07)
Basophils Absolute: 0 10*3/uL (ref 0.0–0.1)
Basophils Relative: 0 %
Eosinophils Absolute: 0 10*3/uL (ref 0.0–0.5)
Eosinophils Relative: 0 %
HCT: 25.6 % — ABNORMAL LOW (ref 39.0–52.0)
Hemoglobin: 7.9 g/dL — ABNORMAL LOW (ref 13.0–17.0)
Immature Granulocytes: 0 %
Lymphocytes Relative: 28 %
Lymphs Abs: 2.5 10*3/uL (ref 0.7–4.0)
MCH: 28.6 pg (ref 26.0–34.0)
MCHC: 30.9 g/dL (ref 30.0–36.0)
MCV: 92.8 fL (ref 80.0–100.0)
Monocytes Absolute: 0.8 10*3/uL (ref 0.1–1.0)
Monocytes Relative: 8 %
Neutro Abs: 5.6 10*3/uL (ref 1.7–7.7)
Neutrophils Relative %: 64 %
Platelets: 254 10*3/uL (ref 150–400)
RBC: 2.76 MIL/uL — ABNORMAL LOW (ref 4.22–5.81)
RDW: 15.4 % (ref 11.5–15.5)
WBC: 9 10*3/uL (ref 4.0–10.5)
nRBC: 0 % (ref 0.0–0.2)

## 2022-09-30 LAB — TROPONIN I (HIGH SENSITIVITY)
Troponin I (High Sensitivity): 11 ng/L (ref <18)
Troponin I (High Sensitivity): 9 ng/L (ref <18)

## 2022-09-30 LAB — URINE CULTURE

## 2022-09-30 LAB — LITHIUM LEVEL: Lithium Lvl: 0.22 mmol/L — ABNORMAL LOW (ref 0.60–1.20)

## 2022-09-30 LAB — GLUCOSE, CAPILLARY
Glucose-Capillary: 90 mg/dL (ref 70–99)
Glucose-Capillary: 117 mg/dL — ABNORMAL HIGH (ref 70–99)

## 2022-09-30 LAB — SARS CORONAVIRUS 2 BY RT PCR: SARS Coronavirus 2 by RT PCR: NEGATIVE

## 2022-09-30 MED ORDER — ACETAMINOPHEN 325 MG PO TABS: 650.0000 mg | ORAL_TABLET | Freq: Four times a day (QID) | ORAL | Status: DC | PRN

## 2022-09-30 MED ORDER — SODIUM CHLORIDE 0.9 % IV SOLN
1.0000 g | Freq: Once | INTRAVENOUS | Status: AC
  Administered 2022-09-30: 1 g via INTRAVENOUS
  Filled 2022-09-30: qty 10

## 2022-09-30 MED ORDER — TEMAZEPAM 15 MG PO CAPS
15.0000 mg | ORAL_CAPSULE | Freq: Every day | ORAL | Status: DC
  Administered 2022-09-30 – 2022-10-01 (×2): 15 mg via ORAL
  Filled 2022-09-30 (×2): qty 1

## 2022-09-30 MED ORDER — ESCITALOPRAM OXALATE 20 MG PO TABS
20.0000 mg | ORAL_TABLET | Freq: Every morning | ORAL | Status: DC
  Administered 2022-10-01 – 2022-10-02 (×2): 20 mg via ORAL
  Filled 2022-09-30 (×2): qty 1

## 2022-09-30 MED ORDER — DOCUSATE SODIUM 100 MG PO CAPS
100.0000 mg | ORAL_CAPSULE | Freq: Two times a day (BID) | ORAL | Status: DC
  Administered 2022-09-30 – 2022-10-02 (×4): 100 mg via ORAL
  Filled 2022-09-30 (×4): qty 1

## 2022-09-30 MED ORDER — PIPERACILLIN-TAZOBACTAM 3.375 G IVPB 30 MIN
3.3750 g | Freq: Once | INTRAVENOUS | Status: AC
  Administered 2022-09-30: 3.375 g via INTRAVENOUS
  Filled 2022-09-30: qty 50

## 2022-09-30 MED ORDER — POLYETHYLENE GLYCOL 3350 17 G PO PACK: 17.0000 g | PACK | Freq: Every day | ORAL | Status: DC | PRN

## 2022-09-30 MED ORDER — SODIUM CHLORIDE 0.9 % IV BOLUS
1000.0000 mL | Freq: Once | INTRAVENOUS | Status: AC
  Administered 2022-09-30: 1000 mL via INTRAVENOUS

## 2022-09-30 MED ORDER — ALBUTEROL SULFATE (2.5 MG/3ML) 0.083% IN NEBU: 2.5000 mg | INHALATION_SOLUTION | RESPIRATORY_TRACT | Status: DC | PRN

## 2022-09-30 MED ORDER — MIRABEGRON ER 25 MG PO TB24
25.0000 mg | ORAL_TABLET | Freq: Every day | ORAL | Status: DC
  Administered 2022-10-01 – 2022-10-02 (×2): 25 mg via ORAL
  Filled 2022-09-30 (×3): qty 1

## 2022-09-30 MED ORDER — METOPROLOL TARTRATE 5 MG/5ML IV SOLN: 5.0000 mg | Freq: Four times a day (QID) | INTRAVENOUS | Status: DC | PRN

## 2022-09-30 MED ORDER — ONDANSETRON HCL 4 MG/2ML IJ SOLN: 4.0000 mg | Freq: Four times a day (QID) | INTRAMUSCULAR | Status: DC | PRN

## 2022-09-30 MED ORDER — SODIUM CHLORIDE 0.9 % IV SOLN: INTRAVENOUS | Status: DC

## 2022-09-30 MED ORDER — LORAZEPAM 1 MG PO TABS
1.0000 mg | ORAL_TABLET | Freq: Two times a day (BID) | ORAL | Status: DC
  Administered 2022-09-30 – 2022-10-02 (×4): 1 mg via ORAL
  Filled 2022-09-30 (×4): qty 1

## 2022-09-30 MED ORDER — INSULIN ASPART 100 UNIT/ML IJ SOLN
0.0000 [IU] | Freq: Every day | INTRAMUSCULAR | Status: DC
  Filled 2022-09-30: qty 0.05

## 2022-09-30 MED ORDER — FERROUS SULFATE 325 (65 FE) MG PO TABS
325.0000 mg | ORAL_TABLET | Freq: Every day | ORAL | Status: DC
  Administered 2022-10-01 – 2022-10-02 (×2): 325 mg via ORAL
  Filled 2022-09-30 (×2): qty 1

## 2022-09-30 MED ORDER — ONDANSETRON HCL 4 MG PO TABS: 4.0000 mg | ORAL_TABLET | Freq: Four times a day (QID) | ORAL | Status: DC | PRN

## 2022-09-30 MED ORDER — LITHIUM CARBONATE 150 MG PO CAPS
150.0000 mg | ORAL_CAPSULE | Freq: Every day | ORAL | Status: DC
  Administered 2022-09-30 – 2022-10-01 (×2): 150 mg via ORAL
  Filled 2022-09-30 (×4): qty 1

## 2022-09-30 MED ORDER — TAMSULOSIN HCL 0.4 MG PO CAPS
0.4000 mg | ORAL_CAPSULE | Freq: Every evening | ORAL | Status: DC
  Administered 2022-09-30 – 2022-10-01 (×2): 0.4 mg via ORAL
  Filled 2022-09-30 (×2): qty 1

## 2022-09-30 MED ORDER — TRAZODONE HCL 100 MG PO TABS
100.0000 mg | ORAL_TABLET | Freq: Every day | ORAL | Status: DC
  Administered 2022-09-30 – 2022-10-01 (×2): 100 mg via ORAL
  Filled 2022-09-30 (×2): qty 1

## 2022-09-30 MED ORDER — PIPERACILLIN-TAZOBACTAM 3.375 G IVPB 30 MIN: 3.3750 g | Freq: Three times a day (TID) | INTRAVENOUS | Status: DC

## 2022-09-30 MED ORDER — ACETAMINOPHEN 650 MG RE SUPP: 650.0000 mg | Freq: Four times a day (QID) | RECTAL | Status: DC | PRN

## 2022-09-30 MED ORDER — FINASTERIDE 5 MG PO TABS
5.0000 mg | ORAL_TABLET | Freq: Every morning | ORAL | Status: DC
  Administered 2022-10-01 – 2022-10-02 (×2): 5 mg via ORAL
  Filled 2022-09-30 (×2): qty 1

## 2022-09-30 MED ORDER — TIMOLOL MALEATE 0.5 % OP SOLN
1.0000 [drp] | Freq: Every evening | OPHTHALMIC | Status: DC
  Administered 2022-09-30 – 2022-10-01 (×2): 1 [drp] via OPHTHALMIC
  Filled 2022-09-30: qty 5

## 2022-09-30 MED ORDER — PIPERACILLIN-TAZOBACTAM 3.375 G IVPB
3.3750 g | Freq: Three times a day (TID) | INTRAVENOUS | Status: DC
  Administered 2022-09-30 – 2022-10-02 (×5): 3.375 g via INTRAVENOUS
  Filled 2022-09-30 (×5): qty 50

## 2022-09-30 MED ORDER — INSULIN ASPART 100 UNIT/ML IJ SOLN
0.0000 [IU] | Freq: Three times a day (TID) | INTRAMUSCULAR | Status: DC
  Administered 2022-10-01: 3 [IU] via SUBCUTANEOUS
  Filled 2022-09-30: qty 0.15

## 2022-09-30 NOTE — ED Triage Notes (Signed)
Pt BIBA from [employer]. Pt has been having frequent falls, and some increased confusion.   Given LR  BP: 120/82 HR: 86 SPO2: 96 RA

## 2022-09-30 NOTE — ED Notes (Signed)
ED TO INPATIENT HANDOFF REPORT  ED Nurse Name and Phone #: Huntley Dec [phone]  S Name/Age/Gender Russell Ayers 89 y.o. male Room/Bed: WA15/WA15  Code Status   Code Status: DNR  Home/SNF/Other Patient oriented to: self, time, and situation Is this baseline? No   Triage Complete: Triage complete  Chief Complaint UTI (urinary tract infection) due to urinary indwelling Foley catheter (HCC) [Y78.295A, N39.0]  Triage Note Pt BIBA from [employer]. Pt has been having frequent falls, and some increased confusion.   Given LR  BP: 120/82 HR: 86 SPO2: 96 RA    Allergies No Known Allergies  Level of Care/Admitting Diagnosis ED Disposition     ED Disposition  Admit   Condition  --   Comment  Hospital Area: [facility] [[id number]]  Level of Care: Med-Surg [16]  May admit patient to [facility] or [name] if equivalent level of care is available:: Yes  Covid Evaluation: Asymptomatic - no recent exposure (last 10 days) testing not required  Diagnosis: UTI (urinary tract infection) due to urinary indwelling Foley catheter [facility]) [[id number]]  Admitting Physician: Maryln Gottron [[id number]]  Attending Physician: Olexa.Dam, MIR Jaxson.Roy [[id number]]  Certification:: I certify this patient will need inpatient services for at least 2 midnights  Estimated Length of Stay: 3          B Medical/Surgery History Past Medical History:  Diagnosis Date   Anemia    Arthritis    Constipation 07/16/2022   Generalized anxiety disorder    Glaucoma    History of kidney stones    Hypertension    Inguinal hernia    Insomnia    Memory impairment 07/16/2022   Pneumonia    July 2023   Primary insomnia 07/16/2022   Prostatism    S/p left hip fracture 02/04/2021   SBO (small bowel obstruction) (HCC) 07/26/2020   Seizure (HCC)    Stroke (HCC)    Type 2 diabetes mellitus with other specified complication (HCC) 07/16/2022   Past Surgical History:  Procedure  Laterality Date   CATARACT EXTRACTION, BILATERAL     FEMUR IM NAIL Left 02/05/2021   Procedure: INTRAMEDULLARY (IM) NAIL FEMORAL;  Surgeon: Sheral Apley, MD;  Location: [facility];  Service: Orthopedics;  Laterality: Left;   glaucoma     HIP SURGERY Left    INGUINAL HERNIA REPAIR Left 02/08/2022   Procedure: LEFT HERNIA REPAIR INGUINAL ADULT;  Surgeon: Berna Bue, MD;  Location: [facility];  Service: General;  Laterality: Left;     A IV Location/Drains/Wounds Patient Lines/Drains/Airways Status     Active Line/Drains/Airways     Name Placement date Placement time Site Days   Peripheral IV 09/03/22 20 G Anterior;Distal;Right;Upper Arm 09/03/22  1115  Arm  27   Peripheral IV 09/30/22 20 G Anterior;Left Forearm 09/30/22  1023  Forearm  less than 1   Urethral Catheter Felicie Kocher RN Coude 18 Fr. 09/30/22  1112  Coude  less than 1            Intake/Output Last 24 hours  Intake/Output Summary (Last 24 hours) at 09/30/2022 1527 Last data filed at 09/30/2022 1526 Gross per 24 hour  Intake 1150 ml  Output --  Net 1150 ml    Labs/Imaging Results for orders placed or performed during the hospital encounter of 09/30/22 (from the past 48 hour(s))  Lactic acid, plasma     Status: Abnormal   Collection Time: 09/30/22 10:30 AM  Result Value Ref Range  Lactic Acid, Venous 2.2 (HH) 0.5 - 1.9 mmol/L    Comment: CRITICAL RESULT CALLED TO, READ BACK BY AND VERIFIED WITH Reiley Keisler, S. RN AT 1139 ON 09/30/2022 BY MECIAL J. Performed at [facility], [address], [place], [place] [address]   Comprehensive metabolic panel     Status: Abnormal   Collection Time: 09/30/22 10:30 AM  Result Value Ref Range   Sodium 126 (L) 135 - 145 mmol/L   Potassium 3.8 3.5 - 5.1 mmol/L   Chloride 96 (L) 98 - 111 mmol/L   CO2 23 22 - 32 mmol/L   Glucose, Bld 133 (H) 70 - 99 mg/dL    Comment: Glucose reference range applies only to samples taken after fasting for at least 8 hours.   BUN 12 8 -  23 mg/dL   Creatinine, Ser 6.57 0.61 - 1.24 mg/dL   Calcium 8.1 (L) 8.9 - 10.3 mg/dL   Total Protein 6.2 (L) 6.5 - 8.1 g/dL   Albumin 2.4 (L) 3.5 - 5.0 g/dL   AST 19 15 - 41 U/L   ALT 8 0 - 44 U/L   Alkaline Phosphatase 76 38 - 126 U/L   Total Bilirubin 0.5 0.3 - 1.2 mg/dL   GFR, Estimated >84 >69 mL/min    Comment: (NOTE) Calculated using the CKD-EPI Creatinine Equation (2021)    Anion gap 7 5 - 15    Comment: Performed at [facility], [address], [place], [place] [address]  CBC with Differential     Status: Abnormal   Collection Time: 09/30/22 10:30 AM  Result Value Ref Range   WBC 9.0 4.0 - 10.5 K/uL   RBC 2.76 (L) 4.22 - 5.81 MIL/uL   Hemoglobin 7.9 (L) 13.0 - 17.0 g/dL   HCT 84.1 (L) 32.4 - 40.1 %   MCV 92.8 80.0 - 100.0 fL   MCH 28.6 26.0 - 34.0 pg   MCHC 30.9 30.0 - 36.0 g/dL   RDW 02.7 25.3 - 66.4 %   Platelets 254 150 - 400 K/uL   nRBC 0.0 0.0 - 0.2 %   Neutrophils Relative % 64 %   Neutro Abs 5.6 1.7 - 7.7 K/uL   Lymphocytes Relative 28 %   Lymphs Abs 2.5 0.7 - 4.0 K/uL   Monocytes Relative 8 %   Monocytes Absolute 0.8 0.1 - 1.0 K/uL   Eosinophils Relative 0 %   Eosinophils Absolute 0.0 0.0 - 0.5 K/uL   Basophils Relative 0 %   Basophils Absolute 0.0 0.0 - 0.1 K/uL   Immature Granulocytes 0 %   Abs Immature Granulocytes 0.03 0.00 - 0.07 K/uL    Comment: Performed at [facility], [address], [place], [place] [address]  Lithium level     Status: Abnormal   Collection Time: 09/30/22 10:30 AM  Result Value Ref Range   Lithium Lvl 0.22 (L) 0.60 - 1.20 mmol/L    Comment: Performed at [facility], [address], [place], [place] [address]  Troponin I (High Sensitivity)     Status: None   Collection Time: 09/30/22 10:30 AM  Result Value Ref Range   Troponin I (High Sensitivity) 11 <18 ng/L    Comment: (NOTE) Elevated high sensitivity troponin I (hsTnI) values and significant  changes across  serial measurements may suggest ACS but many other  chronic and acute conditions are known to elevate hsTnI results.  Refer to the "Links" section for chest pain algorithms and additional  guidance. Performed at [facility], [address], [place], [place] [address]   SARS Coronavirus 2 by RT PCR (hospital order, performed in [facility] hospital lab) *cepheid single result test*     Status: None   Collection Time: 09/30/22 10:55 AM   Specimen: Nasal Swab  Result Value Ref Range   SARS Coronavirus 2 by RT PCR NEGATIVE NEGATIVE    Comment: (NOTE) SARS-CoV-2 target nucleic acids are NOT DETECTED.  The SARS-CoV-2 RNA is generally detectable in upper and lower respiratory specimens during the acute phase of infection. The lowest concentration of SARS-CoV-2 viral copies this assay can detect is 250 copies / mL. A negative result does not preclude SARS-CoV-2 infection and should not be used as the sole basis for treatment or other patient management decisions.  A negative result may occur with improper specimen collection / handling, submission of specimen other than nasopharyngeal swab, presence of viral mutation(s) within the areas targeted by this assay, and inadequate number of viral copies (<250 copies / mL). A negative result must be combined with clinical observations, patient history, and epidemiological information.  Fact Sheet for Patients:   RoadLapTop.co.za  Fact Sheet for Healthcare Providers: http://kim-miller.com/  This test is not yet approved or  cleared by the [place] FDA and has been authorized for detection and/or diagnosis of SARS-CoV-2 by FDA under an Emergency Use Authorization (EUA).  This EUA will remain in effect (meaning this test can be used) for the duration of the COVID-19 declaration under Section 564(b)(1) of the Act, 21 U.S.C. section 360bbb-3(b)(1), unless the authorization is  terminated or revoked sooner.  Performed at [facility], [address], [place], [place] [address]   Urinalysis, w/ Reflex to Culture (Infection Suspected) -Urine, Catheterized     Status: Abnormal   Collection Time: 09/30/22 11:22 AM  Result Value Ref Range   Specimen Source URINE, CATHETERIZED    Color, Urine YELLOW YELLOW   APPearance CLOUDY (A) CLEAR   Specific Gravity, Urine 1.006 1.005 - 1.030   pH 7.0 5.0 - 8.0   Glucose, UA NEGATIVE NEGATIVE mg/dL   Hgb urine dipstick NEGATIVE NEGATIVE   Bilirubin Urine NEGATIVE NEGATIVE   Ketones, ur NEGATIVE NEGATIVE mg/dL   Protein, ur NEGATIVE NEGATIVE mg/dL   Nitrite NEGATIVE NEGATIVE   Leukocytes,Ua LARGE (A) NEGATIVE   RBC / HPF 6-10 0 - 5 RBC/hpf   WBC, UA >50 0 - 5 WBC/hpf    Comment:        Reflex urine culture not performed if WBC <=10, OR if Squamous epithelial cells >5. If Squamous epithelial cells >5 suggest recollection.    Bacteria, UA RARE (A) NONE SEEN   Squamous Epithelial / HPF 0-5 0 - 5 /HPF   WBC Clumps PRESENT    Mucus PRESENT    Hyphae Yeast PRESENT     Comment: Performed at [facility], [address], [place], [place] [address]  Lactic acid, plasma     Status: None   Collection Time: 09/30/22  1:10 PM  Result Value Ref Range   Lactic Acid, Venous 1.0 0.5 - 1.9 mmol/L    Comment: Performed at [facility], [address] [place], [place] [address]  Troponin I (High Sensitivity)     Status: None   Collection Time: 09/30/22  1:10 PM  Result Value Ref Range   Troponin I (High Sensitivity) 9 <18 ng/L    Comment: (NOTE) Elevated high sensitivity troponin I (  hsTnI) values and significant  changes across serial measurements may suggest ACS but many other  chronic and acute conditions are known to elevate hsTnI results.  Refer to the "Links" section for chest pain algorithms and additional  guidance. Performed at [facility], [address], [place], [place] [address]    CT Chest Wo Contrast  Result Date: 09/30/2022 CLINICAL DATA:  Respiratory illness, nondiagnostic xray EXAM: CT CHEST WITHOUT CONTRAST TECHNIQUE: Multidetector CT imaging of the chest was performed following the standard protocol without IV contrast. RADIATION DOSE REDUCTION: This exam was performed according to the departmental dose-optimization program which includes automated exposure control, adjustment of the mA and/or kV according to patient size and/or use of iterative reconstruction technique. COMPARISON:  07/16/2022 FINDINGS: Cardiovascular: Stable borderline cardiomegaly. Moderate multi-vessel coronary artery calcification. Small pericardial effusion has developed. Central pulmonary arteries are enlarged in keeping with changes of pulmonary arterial hypertension. Moderate atherosclerotic calcification is seen within the thoracic aorta. No aortic aneurysm. Mediastinum/Nodes: 12 mm nodule within the right thyroid lobe. Not clinically significant; no follow-up imaging recommended (ref: J Am Coll Radiol. 2015 Feb;12(2): 143-50).No pathologic thoracic adenopathy. Esophagus unremarkable. Stable 8 mm anterior mediastinal soft tissue nodule possibly representing a pre-vascular lymph node or small thymoma. Moderate hiatal hernia again noted. Lungs/Pleura: Improving left lower lobe consolidation though multifocal airway impaction persists with peribronchial nodularity and volume loss. Increasing loculation of small left pleural effusion with associated pleural thickening and heterogeneity of the pleural fluid suspicious for a developing empyema. Mild right basilar atelectasis and trace pleural effusion. No pneumothorax. Stable biapical thinly calcified pleural plaques. No central obstructing lesion. Upper Abdomen: No acute abnormality. Musculoskeletal: Stable remote T12 compression deformity. Unchanged accentuated thoracic kyphosis. No acute bone abnormality. No lytic  or blastic bone lesion. IMPRESSION: 1. Improving left lower lobe consolidation though multifocal airway impaction persists with peribronchial nodularity and volume loss. 2. Increasing loculation of small left pleural effusion with associated pleural thickening and heterogeneity of the pleural fluid suspicious for a developing empyema. 3. Stable borderline cardiomegaly. Moderate multi-vessel coronary artery calcification. 4. Morphologic changes in keeping with pulmonary arterial hypertension. 5. Moderate hiatal hernia. 6. Stable 8 mm anterior mediastinal soft tissue nodule possibly representing a pre-vascular lymph node or small thymoma. Aortic Atherosclerosis (ICD10-I70.0). Electronically Signed   By: Helyn Numbers M.D.   On: 09/30/2022 12:22   CT Head Wo Contrast  Result Date: 09/30/2022 CLINICAL DATA:  Mental status change. Frequent falls. Increased confusion. EXAM: CT HEAD WITHOUT CONTRAST TECHNIQUE: Contiguous axial images were obtained from the base of the skull through the vertex without intravenous contrast. RADIATION DOSE REDUCTION: This exam was performed according to the departmental dose-optimization program which includes automated exposure control, adjustment of the mA and/or kV according to patient size and/or use of iterative reconstruction technique. COMPARISON:  07/27/2020 FINDINGS: Brain: No evidence of acute infarction, hemorrhage, hydrocephalus, extra-axial collection or mass lesion/mass effect. There is mild diffuse low-attenuation within the subcortical and periventricular white matter compatible with chronic microvascular disease. Prominence of the sulci and ventricles compatible with brain atrophy. Vascular: There is mild diffuse low-attenuation within the subcortical and periventricular white matter compatible with chronic microvascular disease. Skull: Normal. Negative for fracture or focal lesion. Sinuses/Orbits: No acute abnormality Other: None. IMPRESSION: 1. No acute intracranial  abnormalities. 2. Chronic microvascular disease and brain atrophy. Electronically Signed   By: Signa Kell M.D.   On: 09/30/2022 11:56   DG Hip Unilat W or Wo Pelvis 2-3 Views Left  Result  Date: 09/30/2022 CLINICAL DATA:  Pain after fall. EXAM: DG HIP (WITH OR WITHOUT PELVIS) 2-3V LEFT COMPARISON:  08/02/2021 FINDINGS: Status post ORIF of the left femur. IM rod and nail appears stable from previous exam. No signs of acute fracture or dislocation. Surgical clips noted within the soft tissues adjacent to the left hip. IMPRESSION: 1. No acute findings. 2. Status post ORIF of left femur. Electronically Signed   By: Signa Kell M.D.   On: 09/30/2022 10:57   DG Chest Port 1 View  Result Date: 09/30/2022 CLINICAL DATA:  Cough.  Confusion. EXAM: PORTABLE CHEST 1 VIEW COMPARISON:  07/16/2022 FINDINGS: Low volume kyphotic film. Patient's face obscures the lung apices. The cardio pericardial silhouette is enlarged. Retrocardiac left base collapse/consolidation again noted and small left pleural effusion cannot be excluded. Probable atelectasis at the right base with trace right pleural effusion. Interstitial markings are diffusely coarsened with chronic features. Bones are diffusely demineralized. Telemetry leads overlie the chest. IMPRESSION: Low volume kyphotic film with persistent retrocardiac left base collapse/consolidation and small left pleural effusion. Electronically Signed   By: Kennith Center M.D.   On: 09/30/2022 10:56    Pending Labs Unresulted Labs (From admission, onward)     Start     Ordered   09/30/22 1503  Occult blood card to lab, stool  Once,   URGENT        09/30/22 1502   09/30/22 1122  Urine Culture  Once,   R        09/30/22 1122   09/30/22 1030  Culture, blood (routine x 2)  BLOOD CULTURE X 2,   R      09/30/22 1030            Vitals/Pain Today's Vitals   09/30/22 1025 09/30/22 1026 09/30/22 1027 09/30/22 1500  BP:  109/63    Pulse:  73    Resp:  20    Temp:  97.7  F (36.5 C)  97.7 F (36.5 C)  TempSrc:  Oral    SpO2: 96%     PainSc:   0-No pain     Isolation Precautions No active isolations  Medications Medications  escitalopram (LEXAPRO) tablet 20 mg (has no administration in time range)  lithium carbonate capsule 150 mg (has no administration in time range)  LORazepam (ATIVAN) tablet 1 mg (has no administration in time range)  temazepam (RESTORIL) capsule 15 mg (has no administration in time range)  traZODone (DESYREL) tablet 100 mg (has no administration in time range)  finasteride (PROSCAR) tablet 5 mg (has no administration in time range)  mirabegron ER (MYRBETRIQ) tablet 25 mg (has no administration in time range)  tamsulosin (FLOMAX) capsule 0.4 mg (has no administration in time range)  ferrous sulfate tablet 325 mg (has no administration in time range)  timolol (TIMOPTIC) 0.5 % ophthalmic solution 1 drop (has no administration in time range)  insulin aspart (novoLOG) injection 0-15 Units (has no administration in time range)  insulin aspart (novoLOG) injection 0-5 Units (has no administration in time range)  acetaminophen (TYLENOL) tablet 650 mg (has no administration in time range)    Or  acetaminophen (TYLENOL) suppository 650 mg (has no administration in time range)  docusate sodium (COLACE) capsule 100 mg (has no administration in time range)  polyethylene glycol (MIRALAX / GLYCOLAX) packet 17 g (has no administration in time range)  ondansetron (ZOFRAN) tablet 4 mg (has no administration in time range)    Or  ondansetron (ZOFRAN) injection 4 mg (  has no administration in time range)  albuterol (PROVENTIL) (2.5 MG/3ML) 0.083% nebulizer solution 2.5 mg (has no administration in time range)  metoprolol tartrate (LOPRESSOR) injection 5 mg (has no administration in time range)  piperacillin-tazobactam (ZOSYN) IVPB 3.375 g (has no administration in time range)  cefTRIAXone (ROCEPHIN) 1 g in sodium chloride 0.9 % 100 mL IVPB (0 g  Intravenous Stopped 09/30/22 1403)  sodium chloride 0.9 % bolus 1,000 mL (0 mLs Intravenous Stopped 09/30/22 1526)  piperacillin-tazobactam (ZOSYN) IVPB 3.375 g (0 g Intravenous Stopped 09/30/22 1526)    Mobility walks with device     Focused Assessments   R Recommendations: See Admitting Provider Note  Report given to:   Additional Notes:

## 2022-09-30 NOTE — H&P (Signed)
History and Physical  Russell Ayers ZOX:[id number] DOB: Feb 23, 1933 DOA: 09/30/2022  PCP: Patient, No Pcp Per   Chief Complaint: Multiple falls  HPI: Russell Ayers is a 89 y.o. male with medical history significant for non-insulin-dependent type 2 diabetes, chronic iron deficiency anemia, generalized anxiety disorder, hypertension, history of seizure and stroke being admitted to the hospital with catheter associated UTI.  Patient has a chronic Foley catheter, he has been slightly confused per his daughter who is at the bedside,.  Normally he ambulates with the assistance of a walker at his nursing facility, but in the last 24 hours he has had a couple of falls.  Daughter was not present, and patient is unable to explain how he fell.  There is no mention of nausea, vomiting, fevers or chills.  Patient is resting comfortably currently, and has no complaints whatsoever.  ED Course: Workup in the emergency department reveals transient tachycardia, patient is underlying low blood pressure.  Lab work was done which shows normal WBC, and hemoglobin down to 8 from recent baseline of about 9.  Initial lactic acid 2.2, normalized to 1.0 after fluids.  Patient was given empiric IV Zosyn in the emergency department, as well as IV fluids and hospitalist was contacted for admission.  Urine culture was obtained after Foley catheter was replaced, and urinalysis is consistent with UTI.  Review of Systems: Please see HPI for pertinent positives and negatives. A complete 10 system review of systems are otherwise negative.  Past Medical History:  Diagnosis Date   Anemia    Arthritis    Constipation 07/16/2022   Generalized anxiety disorder    Glaucoma    History of kidney stones    Hypertension    Inguinal hernia    Insomnia    Memory impairment 07/16/2022   Pneumonia    July 2023   Primary insomnia 07/16/2022   Prostatism    S/p left hip fracture 02/04/2021   SBO (small bowel obstruction) (HCC)  07/26/2020   Seizure (HCC)    Stroke (HCC)    Type 2 diabetes mellitus with other specified complication (HCC) 07/16/2022   Past Surgical History:  Procedure Laterality Date   CATARACT EXTRACTION, BILATERAL     FEMUR IM NAIL Left 02/05/2021   Procedure: INTRAMEDULLARY (IM) NAIL FEMORAL;  Surgeon: Sheral Apley, MD;  Location: [facility];  Service: Orthopedics;  Laterality: Left;   glaucoma     HIP SURGERY Left    INGUINAL HERNIA REPAIR Left 02/08/2022   Procedure: LEFT HERNIA REPAIR INGUINAL ADULT;  Surgeon: Berna Bue, MD;  Location: [facility];  Service: General;  Laterality: Left;    Social History:  reports that he quit smoking about 33 years ago. His smoking use included cigarettes. He has never used smokeless tobacco. He reports that he does not drink alcohol and does not use drugs.   No Known Allergies  Family History  Problem Relation Age of Onset   Cancer Brother    Heart attack Brother      Prior to Admission medications   Medication Sig Start Date End Date Taking? Authorizing Provider  acetaminophen (TYLENOL) 500 MG tablet Take 2 tablets (1,000 mg total) by mouth every 8 (eight) hours as needed. 07/18/22  Yes Burnadette Pop, MD  Cholecalciferol (VITAMIN D3) 50 MCG (2000 UT) TABS Take 2,000 Units by mouth every morning.   Yes [provider]  diclofenac Sodium (VOLTAREN) 1 % GEL Apply 2 g topically in the morning and at bedtime.  Apply 2 grams to right foot toes, left hip, both knees, and left shoulder TWO times a day   Yes [provider]  docusate sodium (COLACE) 100 MG capsule Take 100 mg by mouth 2 (two) times daily.   Yes [provider]  escitalopram (LEXAPRO) 20 MG tablet Take 20 mg by mouth every morning. 09/26/21  Yes [provider]  ferrous sulfate 325 (65 FE) MG tablet Take 325 mg by mouth daily.   Yes [provider]  finasteride (PROSCAR) 5 MG tablet Take 5 mg by mouth every morning. 09/30/18  Yes [provider]  furosemide (LASIX) 40 MG tablet Take 40 mg by mouth daily.   Yes [provider]  latanoprost (XALATAN) 0.005 % ophthalmic solution Place 1 drop into both eyes every evening. 11/20/18  Yes [provider]  lithium carbonate 150 MG capsule Take 150 mg by mouth at bedtime.   Yes [provider]  LORazepam (ATIVAN) 1 MG tablet Take 1 mg by mouth 2 (two) times daily. 09/24/21  Yes [provider]  metFORMIN (GLUCOPHAGE) 500 MG tablet Take 500 mg by mouth 2 (two) times daily. 09/28/21  Yes [provider]  mirabegron ER (MYRBETRIQ) 25 MG TB24 tablet Take 25 mg by mouth in the morning.   Yes [provider]  nystatin powder Apply 1 Application topically 3 (three) times daily as needed (to groin, for yeast).   Yes [provider]  ondansetron (ZOFRAN) 4 MG tablet Take 4 mg by mouth daily as needed for nausea or vomiting.   Yes [provider]  pantoprazole (PROTONIX) 40 MG tablet Take 1 tablet (40 mg total) by mouth daily. 07/19/22  Yes Burnadette Pop, MD  polyethylene glycol (MIRALAX / GLYCOLAX) 17 g packet Take 17 g by mouth daily as needed for mild constipation. Patient taking differently: Take 17 g by mouth daily as needed for mild constipation (to be mixed with 4-6 ounces of water). 02/08/21  Yes Burnadette Pop, MD  senna (SENOKOT) 8.6 MG TABS tablet Take 1 tablet by mouth daily.   Yes [provider]  sodium chloride (OCEAN) 0.65 % SOLN nasal spray Place 1 spray into both nostrils 2 (two) times daily.   Yes [provider]  tamsulosin (FLOMAX) 0.4 MG CAPS capsule Take 0.4 mg by mouth every evening. 11/25/20  Yes [provider]  temazepam (RESTORIL) 15 MG capsule Take 15 mg by mouth at bedtime. 09/19/21  Yes [provider]  timolol (TIMOPTIC) 0.5 % ophthalmic solution Place 1 drop into both eyes every evening. 08/27/21  Yes [provider]  traZODone (DESYREL) 100 MG tablet Take  100 mg by mouth at bedtime.   Yes [provider]  vitamin B-12 (CYANOCOBALAMIN) 100 MCG tablet Take 100 mcg by mouth every morning.   Yes [provider]  zinc oxide 20 % ointment Apply 1 application. topically See admin instructions. Apply topically to buttocks every shift   Yes [provider]  cephALEXin (KEFLEX) 500 MG capsule Take 1 capsule (500 mg total) by mouth 3 (three) times daily. Patient not taking: Reported on 09/30/2022 09/03/22   Redwine, Gabriel Cirri, PA-C    Physical Exam: BP 109/63 (BP Location: Right Arm)   Pulse 73   Temp 97.7 F (36.5 C) (Oral)   Resp 20   SpO2 96%   General:  Alert, oriented to self and place, calm, in no acute distress, daughter is at the bedside.  Thin elderly gentleman who looks  chronically ill, and slightly dehydrated. Eyes: EOMI, clear conjuctivae, white sclerea Neck: supple, no masses, trachea mildline  Cardiovascular: RRR, no murmurs or rubs, no peripheral edema  Respiratory: clear to auscultation bilaterally, no wheezes, no crackles  Abdomen: soft, nontender, nondistended, normal bowel tones heard  GU: Foley catheter in place with clear yellow urine. Skin: dry, no rashes  Musculoskeletal: no joint effusions, normal range of motion  Psychiatric: appropriate affect, normal speech  Neurologic: extraocular muscles intact, clear speech, moving all extremities with intact sensorium          Labs on Admission:  Basic Metabolic Panel: Recent Labs  Lab 09/30/22 1030  NA 126*  K 3.8  CL 96*  CO2 23  GLUCOSE 133*  BUN 12  CREATININE 0.83  CALCIUM 8.1*   Liver Function Tests: Recent Labs  Lab 09/30/22 1030  AST 19  ALT 8  ALKPHOS 76  BILITOT 0.5  PROT 6.2*  ALBUMIN 2.4*   No results for input(s): "LIPASE", "AMYLASE" in the last 168 hours. No results for input(s): "AMMONIA" in the last 168 hours. CBC: Recent Labs  Lab 09/30/22 1030  WBC 9.0  NEUTROABS 5.6  HGB 7.9*  HCT 25.6*  MCV 92.8  PLT 254    Cardiac Enzymes: No results for input(s): "CKTOTAL", "CKMB", "CKMBINDEX", "TROPONINI" in the last 168 hours.  BNP (last 3 results) Recent Labs    07/16/22 1329  BNP 120.2*    ProBNP (last 3 results) No results for input(s): "PROBNP" in the last 8760 hours.  CBG: No results for input(s): "GLUCAP" in the last 168 hours.  Radiological Exams on Admission: CT Chest Wo Contrast  Result Date: 09/30/2022 CLINICAL DATA:  Respiratory illness, nondiagnostic xray EXAM: CT CHEST WITHOUT CONTRAST TECHNIQUE: Multidetector CT imaging of the chest was performed following the standard protocol without IV contrast. RADIATION DOSE REDUCTION: This exam was performed according to the departmental dose-optimization program which includes automated exposure control, adjustment of the mA and/or kV according to patient size and/or use of iterative reconstruction technique. COMPARISON:  07/16/2022 FINDINGS: Cardiovascular: Stable borderline cardiomegaly. Moderate multi-vessel coronary artery calcification. Small pericardial effusion has developed. Central pulmonary arteries are enlarged in keeping with changes of pulmonary arterial hypertension. Moderate atherosclerotic calcification is seen within the thoracic aorta. No aortic aneurysm. Mediastinum/Nodes: 12 mm nodule within the right thyroid lobe. Not clinically significant; no follow-up imaging recommended (ref: J Am Coll Radiol. 2015 Feb;12(2): 143-50).No pathologic thoracic adenopathy. Esophagus unremarkable. Stable 8 mm anterior mediastinal soft tissue nodule possibly representing a pre-vascular lymph node or small thymoma. Moderate hiatal hernia again noted. Lungs/Pleura: Improving left lower lobe consolidation though multifocal airway impaction persists with peribronchial nodularity and volume loss. Increasing loculation of small left pleural effusion with associated pleural thickening and heterogeneity of the pleural fluid suspicious for a developing empyema.  Mild right basilar atelectasis and trace pleural effusion. No pneumothorax. Stable biapical thinly calcified pleural plaques. No central obstructing lesion. Upper Abdomen: No acute abnormality. Musculoskeletal: Stable remote T12 compression deformity. Unchanged accentuated thoracic kyphosis. No acute bone abnormality. No lytic or blastic bone lesion. IMPRESSION: 1. Improving left lower lobe consolidation though multifocal airway impaction persists with peribronchial nodularity and volume loss. 2. Increasing loculation of small left pleural effusion with associated pleural thickening and heterogeneity of the pleural fluid suspicious for a developing empyema. 3. Stable borderline cardiomegaly. Moderate multi-vessel coronary artery calcification. 4. Morphologic changes in keeping with pulmonary arterial hypertension. 5. Moderate hiatal hernia. 6. Stable 8 mm anterior mediastinal soft tissue nodule possibly  representing a pre-vascular lymph node or small thymoma. Aortic Atherosclerosis (ICD10-I70.0). Electronically Signed   By: Helyn Numbers M.D.   On: 09/30/2022 12:22   CT Head Wo Contrast  Result Date: 09/30/2022 CLINICAL DATA:  Mental status change. Frequent falls. Increased confusion. EXAM: CT HEAD WITHOUT CONTRAST TECHNIQUE: Contiguous axial images were obtained from the base of the skull through the vertex without intravenous contrast. RADIATION DOSE REDUCTION: This exam was performed according to the departmental dose-optimization program which includes automated exposure control, adjustment of the mA and/or kV according to patient size and/or use of iterative reconstruction technique. COMPARISON:  07/27/2020 FINDINGS: Brain: No evidence of acute infarction, hemorrhage, hydrocephalus, extra-axial collection or mass lesion/mass effect. There is mild diffuse low-attenuation within the subcortical and periventricular white matter compatible with chronic microvascular disease. Prominence of the sulci and  ventricles compatible with brain atrophy. Vascular: There is mild diffuse low-attenuation within the subcortical and periventricular white matter compatible with chronic microvascular disease. Skull: Normal. Negative for fracture or focal lesion. Sinuses/Orbits: No acute abnormality Other: None. IMPRESSION: 1. No acute intracranial abnormalities. 2. Chronic microvascular disease and brain atrophy. Electronically Signed   By: Signa Kell M.D.   On: 09/30/2022 11:56   DG Hip Unilat W or Wo Pelvis 2-3 Views Left  Result Date: 09/30/2022 CLINICAL DATA:  Pain after fall. EXAM: DG HIP (WITH OR WITHOUT PELVIS) 2-3V LEFT COMPARISON:  08/02/2021 FINDINGS: Status post ORIF of the left femur. IM rod and nail appears stable from previous exam. No signs of acute fracture or dislocation. Surgical clips noted within the soft tissues adjacent to the left hip. IMPRESSION: 1. No acute findings. 2. Status post ORIF of left femur. Electronically Signed   By: Signa Kell M.D.   On: 09/30/2022 10:57   DG Chest Port 1 View  Result Date: 09/30/2022 CLINICAL DATA:  Cough.  Confusion. EXAM: PORTABLE CHEST 1 VIEW COMPARISON:  07/16/2022 FINDINGS: Low volume kyphotic film. Patient's face obscures the lung apices. The cardio pericardial silhouette is enlarged. Retrocardiac left base collapse/consolidation again noted and small left pleural effusion cannot be excluded. Probable atelectasis at the right base with trace right pleural effusion. Interstitial markings are diffusely coarsened with chronic features. Bones are diffusely demineralized. Telemetry leads overlie the chest. IMPRESSION: Low volume kyphotic film with persistent retrocardiac left base collapse/consolidation and small left pleural effusion. Electronically Signed   By: Kennith Center M.D.   On: 09/30/2022 10:56    Assessment/Plan   UTI (urinary tract infection) due to urinary indwelling catheter (HCC)-this is most likely the source of his presentation with mild  confusion, and falls at his facility. -Inpatient admission to MedSurg -Continue maintenance IV fluids, lactate has normalized -Empiric IV Zosyn -Follow blood and urine cultures obtained in the ER    Essential hypertension-blood pressure borderline low, does not appear to be on any scheduled home antihypertensives    BPH (benign prostatic hyperplasia)-continue Flomax and Proscar, continue chronic Foley catheter which was replaced in the emergency department    Type 2 diabetes mellitus with other specified complication (HCC) -Diabetic diet when eating -Hold home metformin -Sliding scale insulin    Hyponatremia-likely due to acute infection, continue hydration with normal saline and recheck sodium level in the morning    Iron deficiency anemia due to chronic blood loss-seems to be slightly lower than baseline, normal reported blood loss.  He does have outpatient GI consultation scheduled for the end of this month. -Check Hemoccult stool, noting that he is on oral iron replacement -  Continue iron supplementation -Trend hemoglobin  DVT prophylaxis: SCDs, will hold pharmacological PPX due to anemia.     Code Status: DNR, confirmed with daughter at the bedside at the time of admission.  Consults called: None  Admission status: The appropriate patient status for this patient is INPATIENT. Inpatient status is judged to be reasonable and necessary in order to provide the required intensity of service to ensure the patient's safety. The patient's presenting symptoms, physical exam findings, and initial radiographic and laboratory data in the context of their chronic comorbidities is felt to place them at high risk for further clinical deterioration. Furthermore, it is not anticipated that the patient will be medically stable for discharge from the hospital within 2 midnights of admission.    I certify that at the point of admission it is my clinical judgment that the patient will require inpatient  hospital care spanning beyond 2 midnights from the point of admission due to high intensity of service, high risk for further deterioration and high frequency of surveillance required   Time spent: 43 minutes  Harlyn Italiano Sharlette Dense MD [facility] Pager [phone]  If 7PM-7AM, please contact night-coverage [email or web address] Password [facility]  09/30/2022, 3:05 PM

## 2022-09-30 NOTE — ED Provider Notes (Signed)
[place] EMERGENCY DEPARTMENT AT [facility] Provider Note   CSN: [id number] Arrival date & time: 09/30/22  1015     History  Chief Complaint  Patient presents with   Cough    Russell Ayers is a 89 y.o. male.  89 year old male with prior medical history as detailed below presents for evaluation.  Patient is a resident at [facility].  Patient with 3 falls over the last 24 hours per report.  [facility].  Patient is apparently exhibiting mildly increased confusion as well.  Patient is without complaint on evaluation.  Patient reports that he typically uses a walker for ambulation assistance.  Patient reports that with the most recent fall he does not think he was using his walker.  The history is provided by the patient and medical records.       Home Medications Prior to Admission medications   Medication Sig Start Date End Date Taking? Authorizing Provider  acetaminophen (TYLENOL) 500 MG tablet Take 2 tablets (1,000 mg total) by mouth every 8 (eight) hours as needed. 07/18/22   Burnadette Pop, MD  cephALEXin (KEFLEX) 500 MG capsule Take 1 capsule (500 mg total) by mouth 3 (three) times daily. 09/03/22   Redwine, Madison A, PA-C  Cholecalciferol (VITAMIN D3) 50 MCG (2000 UT) TABS Take 2,000 Units by mouth every morning.    [provider]  diclofenac Sodium (VOLTAREN) 1 % GEL Apply 2 g topically in the morning and at bedtime. Apply 2 grams to right foot toes, left hip, both knees, and left shoulder TWO times a day    [provider]  docusate sodium (COLACE) 100 MG capsule Take 100 mg by mouth 2 (two) times daily.    [provider]  escitalopram (LEXAPRO) 20 MG tablet Take 20 mg by mouth every morning. 09/26/21   [provider]  ferrous sulfate 325 (65 FE) MG tablet Take 325 mg by mouth daily.    [provider]  finasteride (PROSCAR) 5 MG tablet Take 5 mg by mouth  every morning. 09/30/18   [provider]  furosemide (LASIX) 20 MG tablet Take 20-40 mg by mouth See admin instructions. Take 40 mg by mouth in the morning and 20 mg in the evening 12/21/21   [provider]  latanoprost (XALATAN) 0.005 % ophthalmic solution Place 1 drop into both eyes every evening. 11/20/18   [provider]  lithium carbonate 150 MG capsule Take 150 mg by mouth at bedtime.    [provider]  LORazepam (ATIVAN) 1 MG tablet Take 1 mg by mouth 2 (two) times daily. 09/24/21   [provider]  metFORMIN (GLUCOPHAGE) 500 MG tablet Take 500 mg by mouth 2 (two) times daily. 09/28/21   [provider]  mirabegron ER (MYRBETRIQ) 25 MG TB24 tablet Take 25 mg by mouth in the morning.    [provider]  nystatin powder Apply 1 Application topically 3 (three) times daily as needed (to groin, for yeast).    [provider]  pantoprazole (PROTONIX) 40 MG tablet Take 1 tablet (40 mg total) by mouth daily. 07/19/22   Burnadette Pop, MD  polyethylene glycol (MIRALAX / GLYCOLAX) 17 g packet Take 17 g by mouth daily as needed for mild constipation. Patient taking differently: Take 17 g by mouth daily as needed for mild constipation (to be mixed with 4-6 ounces of water). 02/08/21   Burnadette Pop, MD  senna (SENOKOT) 8.6 MG  TABS tablet Take 1 tablet by mouth daily.    [provider]  sodium chloride (OCEAN) 0.65 % SOLN nasal spray Place 1 spray into both nostrils 2 (two) times daily.    [provider]  tamsulosin (FLOMAX) 0.4 MG CAPS capsule Take 0.4 mg by mouth every evening. 11/25/20   [provider]  temazepam (RESTORIL) 15 MG capsule Take 15 mg by mouth at bedtime. 09/19/21   [provider]  timolol (TIMOPTIC) 0.5 % ophthalmic solution Place 1 drop into both eyes every evening. 08/27/21   [provider]  traZODone (DESYREL) 100 MG tablet Take 100 mg by mouth at bedtime.    [provider]  vitamin B-12 (CYANOCOBALAMIN) 100 MCG tablet Take 100 mcg by mouth every morning.    [provider]  zinc oxide 20 % ointment Apply 1 application. topically See admin instructions. Apply topically to buttocks every shift    [provider]      Allergies    Patient has no known allergies.    Review of Systems   Review of Systems  All other systems reviewed and are negative.   Physical Exam Updated Vital Signs BP 109/63 (BP Location: Right Arm)   Pulse 73   Temp 97.7 F (36.5 C) (Oral)   Resp 20   SpO2 96%  Physical Exam Vitals and nursing note reviewed.  Constitutional:      General: He is not in acute distress.    Appearance: Normal appearance. He is well-developed.  HENT:     Head: Normocephalic and atraumatic.  Eyes:     Conjunctiva/sclera: Conjunctivae normal.     Pupils: Pupils are equal, round, and reactive to light.  Cardiovascular:     Rate and Rhythm: Normal rate and regular rhythm.     Heart sounds: Normal heart sounds.  Pulmonary:     Effort: Pulmonary effort is normal. No respiratory distress.     Breath sounds: Normal breath sounds.  Abdominal:     General: There is no distension.     Palpations: Abdomen is soft.     Tenderness: There is no abdominal tenderness.  Genitourinary:    Comments: Foley catheter in place Musculoskeletal:        General: No deformity. Normal range of motion.     Cervical back: Normal range of motion and neck supple.  Skin:    General: Skin is warm and dry.  Neurological:     General: No focal deficit present.     Mental Status: He is alert and oriented to person, place, and time.     ED Results / Procedures / Treatments   Labs (all labs ordered are listed, but only abnormal results are displayed) Labs Reviewed  CULTURE, BLOOD (ROUTINE X 2)  CULTURE, BLOOD (ROUTINE X 2)  SARS CORONAVIRUS 2 BY RT PCR  LACTIC ACID, PLASMA  LACTIC ACID, PLASMA  COMPREHENSIVE METABOLIC PANEL  CBC WITH  DIFFERENTIAL/PLATELET  URINALYSIS, W/ REFLEX TO CULTURE (INFECTION SUSPECTED)  LITHIUM LEVEL  TROPONIN I (HIGH SENSITIVITY)    EKG None  Radiology No results found.  Procedures Procedures    Medications Ordered in ED Medications - No data to display  ED Course/ Medical Decision Making/ A&P                             Medical Decision Making Amount and/or Complexity of Data Reviewed Labs: ordered. Radiology: ordered.  Risk Prescription drug  management. Decision regarding hospitalization.    Medical Screen Complete  This patient presented to the ED with complaint of weakness, falls.  This complaint involves an extensive number of treatment options. The initial differential diagnosis includes, but is not limited to, metabolic abnormality, infection, trauma from fall, etc.  This presentation is: Acute, Chronic, Self-Limited, Previously Undiagnosed, Uncertain Prognosis, Complicated, Systemic Symptoms, and Threat to Life/Bodily Function  Patient presents from his facility for evaluation.  Patient was noted to have increased falls and increased weakness for the last 24 hours.  Staff sent patient to ED for evaluation of possible infection.  Patient with minimal complaint on initial evaluation.  Patient's urine checked after change of Foley catheter.  Obtained UA is suggestive of UTI.  Chest x-ray was nondiagnostic.  CT chest suggest possible chronic pleural effusion.  However, patient is with minimal to no respiratory complaint.  Patient is not hypoxic.  Patient would would benefit from admission.  Antibiotics provided here in the ED.  Hospitalist service is aware of case and will evaluate for admission.  Additional history obtained:  External records from outside sources obtained and reviewed including prior ED visits and prior Inpatient records.    Lab Tests:  I ordered and personally interpreted labs.  The pertinent results include: CBC, CMP, lithium level,  UA, lactic acid   Imaging Studies ordered:  I ordered imaging studies including chest x-ray, left hip x-ray, CT head, CT chest  I agree with the radiologist interpretation.   Cardiac Monitoring:  The patient was maintained on a cardiac monitor.  I personally viewed and interpreted the cardiac monitor which showed an underlying rhythm of: NSR   Medicines ordered:  I ordered medication including Rocephin, Zosyn for suspected infection Reevaluation of the patient after these medicines showed that the patient: improved  Problem List / ED Course:  Weakness, suspected UTI, hyponatremia   Reevaluation:  After the interventions noted above, I reevaluated the patient and found that they have: improved  Disposition:  After consideration of the diagnostic results and the patients response to treatment, I feel that the patent would benefit from admission.          Final Clinical Impression(s) / ED Diagnoses Final diagnoses:  Weakness  Urinary tract infection without hematuria, site unspecified    Rx / DC Orders ED Discharge Orders     None         Wynetta Fines, MD 09/30/22 1408

## 2022-10-01 ENCOUNTER — Encounter (HOSPITAL_COMMUNITY): Payer: Self-pay | Admitting: Internal Medicine

## 2022-10-01 DIAGNOSIS — R531 Weakness: Secondary | ICD-10-CM | POA: Diagnosis not present

## 2022-10-01 DIAGNOSIS — E1142 Type 2 diabetes mellitus with diabetic polyneuropathy: Secondary | ICD-10-CM

## 2022-10-01 DIAGNOSIS — N401 Enlarged prostate with lower urinary tract symptoms: Secondary | ICD-10-CM | POA: Diagnosis not present

## 2022-10-01 DIAGNOSIS — T83511A Infection and inflammatory reaction due to indwelling urethral catheter, initial encounter: Secondary | ICD-10-CM | POA: Diagnosis not present

## 2022-10-01 LAB — CBC
HCT: 26.4 % — ABNORMAL LOW (ref 39.0–52.0)
Hemoglobin: 8 g/dL — ABNORMAL LOW (ref 13.0–17.0)
MCH: 28.7 pg (ref 26.0–34.0)
MCHC: 30.3 g/dL (ref 30.0–36.0)
MCV: 94.6 fL (ref 80.0–100.0)
Platelets: 268 10*3/uL (ref 150–400)
RBC: 2.79 MIL/uL — ABNORMAL LOW (ref 4.22–5.81)
RDW: 15.4 % (ref 11.5–15.5)
WBC: 6.1 10*3/uL (ref 4.0–10.5)
nRBC: 0 % (ref 0.0–0.2)

## 2022-10-01 LAB — GLUCOSE, CAPILLARY
Glucose-Capillary: 106 mg/dL — ABNORMAL HIGH (ref 70–99)
Glucose-Capillary: 166 mg/dL — ABNORMAL HIGH (ref 70–99)
Glucose-Capillary: 84 mg/dL (ref 70–99)
Glucose-Capillary: 122 mg/dL — ABNORMAL HIGH (ref 70–99)

## 2022-10-01 LAB — COMPREHENSIVE METABOLIC PANEL
ALT: 10 U/L (ref 0–44)
AST: 14 U/L — ABNORMAL LOW (ref 15–41)
Albumin: 2.4 g/dL — ABNORMAL LOW (ref 3.5–5.0)
Alkaline Phosphatase: 85 U/L (ref 38–126)
Anion gap: 7 (ref 5–15)
BUN: 9 mg/dL (ref 8–23)
CO2: 24 mmol/L (ref 22–32)
Calcium: 8.2 mg/dL — ABNORMAL LOW (ref 8.9–10.3)
Chloride: 103 mmol/L (ref 98–111)
Creatinine, Ser: 0.79 mg/dL (ref 0.61–1.24)
GFR, Estimated: >60 mL/min (ref >60)
Glucose, Bld: 168 mg/dL — ABNORMAL HIGH (ref 70–99)
Potassium: 4 mmol/L (ref 3.5–5.1)
Sodium: 134 mmol/L — ABNORMAL LOW (ref 135–145)
Total Bilirubin: 0.4 mg/dL (ref 0.3–1.2)
Total Protein: 6.6 g/dL (ref 6.5–8.1)

## 2022-10-01 MED ORDER — CHLORHEXIDINE GLUCONATE CLOTH 2 % EX PADS
6.0000 | MEDICATED_PAD | Freq: Every day | CUTANEOUS | Status: DC
  Administered 2022-10-01 – 2022-10-02 (×2): 6 via TOPICAL

## 2022-10-01 MED ORDER — GUAIFENESIN ER 600 MG PO TB12
600.0000 mg | ORAL_TABLET | Freq: Two times a day (BID) | ORAL | Status: DC
  Administered 2022-10-01 – 2022-10-02 (×3): 600 mg via ORAL
  Filled 2022-10-01 (×3): qty 1

## 2022-10-01 NOTE — Progress Notes (Signed)
[facility]  PROGRESS NOTE  Russell Ayers ZOX:[id number] DOB: 12-17-1932 DOA: 09/30/2022 PCP: Patient, No Pcp Per   Brief HPI:   89 year old male with medical history of NIDDM type II, chronic deficiency anemia, generalized anxiety disorder, hypertension, history of seizure, history of stroke admitted to hospital with catheter associated UTI.  Patient was having falls at the facility.  Workup in the ED showed lactic acid 2.2, normalized 1.0 after IV fluids.  Started on IV Zosyn.  Patient has chronic Foley catheter, which was replaced in the ED.  UA consistent with UTI.      Assessment/Plan:    Catheter associated UTI -Due to indwelling Foley catheter -Continue IV Zosyn -Follow urine and blood culture results  Hypertension -Blood pressure is soft -Not on hypertensive meds at home -Continue to monitor patient's blood pressures in the hospital  Diabetes mellitus type 2 -CBG well-controlled -Continue moderate sliding scale with NovoLog  BPH -Continue tamsulosin, finasteride  Hyponatremia -Likely from dehydration -Started on IV normal saline -Follow serum sodium today  Iron-deficiency anemia -Likely from chronic blood loss -Outpatient GI consultation has been scheduled at the end of this month -Continue oral iron supplementation -Follow hemoglobin today  Cough -Start Mucinex 600 mg p.o. twice daily -Flutter valve every 4 hours -Stop IV fluids  Medications     docusate sodium  100 mg Oral BID   escitalopram  20 mg Oral q morning   ferrous sulfate  325 mg Oral Daily   finasteride  5 mg Oral q morning   insulin aspart  0-15 Units Subcutaneous TID WC   insulin aspart  0-5 Units Subcutaneous QHS   lithium carbonate  150 mg Oral QHS   LORazepam  1 mg Oral BID   mirabegron ER  25 mg Oral Daily   tamsulosin  0.4 mg Oral QPM   temazepam  15 mg Oral QHS   timolol  1 drop Both Eyes QPM   traZODone  100 mg Oral QHS     Data Reviewed:   CBG:  Recent Labs   Lab 09/30/22 1719 09/30/22 2135 10/01/22 0731  GLUCAP 90 117* 106*    SpO2: 95 %    Vitals:   09/30/22 2132 10/01/22 0158 10/01/22 0544 10/01/22 0913  BP: (!) 117/46 (!) 117/48 (!) 107/59 (!) 108/42  Pulse: 64 (!) 109 (!) 106 68  Resp:      Temp: 98 F (36.7 C) 97.9 F (36.6 C) 97.6 F (36.4 C) 97.8 F (36.6 C)  TempSrc: Oral Oral Oral Oral  SpO2: 91% 97% 98% 95%      Data Reviewed:  Basic Metabolic Panel: Recent Labs  Lab 09/30/22 1030  NA 126*  K 3.8  CL 96*  CO2 23  GLUCOSE 133*  BUN 12  CREATININE 0.83  CALCIUM 8.1*    CBC: Recent Labs  Lab 09/30/22 1030  WBC 9.0  NEUTROABS 5.6  HGB 7.9*  HCT 25.6*  MCV 92.8  PLT 254    LFT Recent Labs  Lab 09/30/22 1030  AST 19  ALT 8  ALKPHOS 76  BILITOT 0.5  PROT 6.2*  ALBUMIN 2.4*     Antibiotics: Anti-infectives (From admission, onward)    Start     Dose/Rate Route Frequency Ordered Stop   09/30/22 2200  piperacillin-tazobactam (ZOSYN) IVPB 3.375 g        3.375 g 12.5 mL/hr over 240 Minutes Intravenous Every 8 hours 09/30/22 1506     09/30/22 1515  piperacillin-tazobactam (ZOSYN) IVPB 3.375 g  Status:  Discontinued        3.375 g 100 mL/hr over 30 Minutes Intravenous Every 8 hours 09/30/22 1502 09/30/22 1505   09/30/22 1400  piperacillin-tazobactam (ZOSYN) IVPB 3.375 g        3.375 g 100 mL/hr over 30 Minutes Intravenous  Once 09/30/22 1348 09/30/22 1526   09/30/22 1200  cefTRIAXone (ROCEPHIN) 1 g in sodium chloride 0.9 % 100 mL IVPB        1 g 200 mL/hr over 30 Minutes Intravenous  Once 09/30/22 1146 09/30/22 1403        DVT prophylaxis: SCDs  Code Status: DNR  Family Communication:    CONSULTS    Subjective   Complains of coughing this morning.  Coughing up phlegm.   Objective    Physical Examination:   General: Appears in no acute distress Cardiovascular: 1 S2, regular, no murmur auscultated Respiratory: Lungs are clear to auscultation bilaterally Abdomen:  Soft, nontender, no organomegaly Extremities: No edema in the lower extremities Neurologic: Alert, oriented x 3, no focal deficit noted   Status is: Inpatient:             Meredeth Ide   [facility] If 7PM-7AM, please contact night-coverage at [email or web address], Office  [phone]   10/01/2022, 10:20 AM  LOS: 1 day

## 2022-10-02 DIAGNOSIS — T83511A Infection and inflammatory reaction due to indwelling urethral catheter, initial encounter: Secondary | ICD-10-CM | POA: Diagnosis not present

## 2022-10-02 DIAGNOSIS — R531 Weakness: Secondary | ICD-10-CM | POA: Diagnosis not present

## 2022-10-02 DIAGNOSIS — D500 Iron deficiency anemia secondary to blood loss (chronic): Secondary | ICD-10-CM | POA: Diagnosis not present

## 2022-10-02 DIAGNOSIS — E871 Hypo-osmolality and hyponatremia: Secondary | ICD-10-CM

## 2022-10-02 DIAGNOSIS — E1169 Type 2 diabetes mellitus with other specified complication: Secondary | ICD-10-CM

## 2022-10-02 LAB — URINE CULTURE

## 2022-10-02 LAB — GLUCOSE, CAPILLARY
Glucose-Capillary: 107 mg/dL — ABNORMAL HIGH (ref 70–99)
Glucose-Capillary: 109 mg/dL — ABNORMAL HIGH (ref 70–99)

## 2022-10-02 MED ORDER — GUAIFENESIN ER 600 MG PO TB12: 600.0000 mg | ORAL_TABLET | Freq: Two times a day (BID) | ORAL | 0 refills | Status: AC

## 2022-10-02 MED ORDER — AMOXICILLIN-POT CLAVULANATE 875-125 MG PO TABS: 1.0000 | ORAL_TABLET | Freq: Two times a day (BID) | ORAL | 0 refills | Status: AC

## 2022-10-02 NOTE — Evaluation (Signed)
Physical Therapy Evaluation Patient Details Name: Russell Ayers MRN: [id number] DOB: December 11, 1932 Today's Date: 10/02/2022  History of Present Illness  89 year old male with medical history of NIDDM type II, chronic deficiency anemia, generalized anxiety disorder, hypertension, history of seizure, history of stroke admitted to hospital with catheter associated UTI.  Patient was having falls at the facility.  Workup in the ED showed elevated lactic acid. Patient has chronic Foley catheter, which was replaced in the ED.  UA consistent with UTI.  Clinical Impression  The patient is very pleasant . Patient eager to get back to ALF.  Patient ambulated x 46' with Rw and min guard. Recommend HHPT at ALF for balance work.  Plans are to return to day to ALF .      Recommendations for follow up therapy are one component of a multi-disciplinary discharge planning process, led by the attending physician.  Recommendations may be updated based on patient status, additional functional criteria and insurance authorization.  Follow Up Recommendations       Assistance Recommended at Discharge Set up Supervision/Assistance  Patient can return home with the following  A little help with walking and/or transfers;A little help with bathing/dressing/bathroom;Assist for transportation    Equipment Recommendations None recommended by PT  Recommendations for Other Services       Functional Status Assessment Patient has had a recent decline in their functional status and demonstrates the ability to make significant improvements in function in a reasonable and predictable amount of time.     Precautions / Restrictions Precautions Precautions: Fall      Mobility  Bed Mobility Overal bed mobility: Modified Independent                  Transfers Overall transfer level: Needs assistance Equipment used: Rolling walker (2 wheels) Transfers: Sit to/from Stand Sit to Stand: Supervision                 Ambulation/Gait Ambulation/Gait assistance: Min guard Gait Distance (Feet): 70 Feet Assistive device: Rolling walker (2 wheels) Gait Pattern/deviations: Step-through pattern Gait velocity: decr     General Gait Details: patient ambulates slowly and is  steady with RW  Stairs            Wheelchair Mobility    Modified Rankin (Stroke Patients Only)       Balance Overall balance assessment: Needs assistance Sitting-balance support: No upper extremity supported, Feet supported Sitting balance-Leahy Scale: Good     Standing balance support: During functional activity, Bilateral upper extremity supported, Reliant on assistive device for balance Standing balance-Leahy Scale: Fair                               Pertinent Vitals/Pain Pain Assessment Pain Assessment: Faces Faces Pain Scale: Hurts a little bit Pain Location: back Pain Descriptors / Indicators: Discomfort Pain Intervention(s): Monitored during session    Home Living Family/patient expects to be discharged to:: Assisted living                 Home Equipment: Agricultural consultant (2 wheels);Wheelchair - manual      Prior Function Prior Level of Function : History of Falls (last six months)             Mobility Comments: uses a RW ADLs Comments: assistance for ADL's     Hand Dominance   Dominant Hand: Right    Extremity/Trunk Assessment   Upper Extremity Assessment Upper  Extremity Assessment: Overall WFL for tasks assessed    Lower Extremity Assessment Lower Extremity Assessment: Overall WFL for tasks assessed    Cervical / Trunk Assessment Cervical / Trunk Assessment: Kyphotic  Communication   Communication: No difficulties  Cognition Arousal/Alertness: Awake/alert Behavior During Therapy: WFL for tasks assessed/performed Overall Cognitive Status: Within Functional Limits for tasks assessed                                          General  Comments      Exercises     Assessment/Plan    PT Assessment All further PT needs can be met in the next venue of care  PT Problem List Decreased strength;Decreased mobility;Decreased range of motion;Decreased knowledge of precautions;Decreased activity tolerance;Decreased balance       PT Treatment Interventions      PT Goals (Current goals can be found in the Care Plan section)  Acute Rehab PT Goals Patient Stated Goal: ready to go PT Goal Formulation: All assessment and education complete, DC therapy    Frequency       Co-evaluation               AM-PAC PT "6 Clicks" Mobility  Outcome Measure Help needed turning from your back to your side while in a flat bed without using bedrails?: None Help needed moving from lying on your back to sitting on the side of a flat bed without using bedrails?: None Help needed moving to and from a bed to a chair (including a wheelchair)?: A Little Help needed standing up from a chair using your arms (e.g., wheelchair or bedside chair)?: A Little Help needed to walk in hospital room?: A Little Help needed climbing 3-5 steps with a railing? : A Lot 6 Click Score: 19    End of Session Equipment Utilized During Treatment: Gait belt Activity Tolerance: Patient tolerated treatment well Patient left: in chair;with call bell/phone within reach;with chair alarm set Nurse Communication: Mobility status PT Visit Diagnosis: Unsteadiness on feet (R26.81);Repeated falls (R29.6)    Time: 4782-9562 PT Time Calculation (min) (ACUTE ONLY): 14 min   Charges:   PT Evaluation $PT Eval Low Complexity: 1 Low          Blanchard Kelch PT Acute Rehabilitation Services Office [phone] Weekend [phone]   Rada Hay 10/02/2022, 2:24 PM

## 2022-10-02 NOTE — Progress Notes (Signed)
Patient and patient's daughter were given discharge orders, and all questions were answered. Patient was stable for discharge and was taken to the main exit by wheelchair.

## 2022-10-02 NOTE — TOC Progression Note (Signed)
Transition of Care [facility]) - Progression Note    Patient Details  Name: Russell Ayers MRN: [id number] Date of Birth: Feb 09, 1933  [facility] CM/SW Contact  Geni Bers, RN Phone Number: 10/02/2022, 2:55 PM  Clinical Narrative:     Spoke with Nada Maclachlan at [facility] concerning pt. Pt may return to [facility], will need discharge summary by 1530.  Expected Discharge Plan: Assisted Living Barriers to Discharge: No Barriers Identified  Expected Discharge Plan and Services       Living arrangements for the past 2 months: Assisted Living Facility                                       Social Determinants of Health (SDOH) Interventions SDOH Screenings   Food Insecurity: No Food Insecurity (10/01/2022)  Housing: Low Risk  (10/01/2022)  Transportation Needs: No Transportation Needs (10/01/2022)  Utilities: Not At Risk (10/01/2022)  Tobacco Use: Medium Risk (10/01/2022)    Readmission Risk Interventions    07/18/2022   12:00 PM 07/17/2022    2:49 PM  Readmission Risk Prevention Plan  Transportation Screening Complete Complete  PCP or Specialist Appt within 5-7 Days Complete Complete  Home Care Screening Complete Complete  Medication Review (RN CM) Complete Complete

## 2022-10-02 NOTE — Discharge Summary (Signed)
Physician Discharge Summary   Patient: Russell Ayers MRN: [id number] DOB: 22-Aug-1932  Admit date:     09/30/2022  Discharge date: 10/02/22  Discharge Physician: Meredeth Ide   PCP: Patient, No Pcp Per   Recommendations at discharge:   Patient will be discharged on Augmentin 1 tablet p.o. twice daily for 2 weeks  Discharge Diagnoses: Principal Problem:   UTI (urinary tract infection) due to urinary indwelling catheter (HCC) Active Problems:   Diabetic neuropathy (HCC)   Essential hypertension   BPH (benign prostatic hyperplasia)   Type 2 diabetes mellitus with other specified complication (HCC)   Hyponatremia   Iron deficiency anemia due to chronic blood loss  Resolved Problems:   * No resolved hospital problems. *  Hospital Course:  89 year old male with medical history of NIDDM type II, chronic deficiency anemia, generalized anxiety disorder, hypertension, history of seizure, history of stroke admitted to hospital with catheter associated UTI.  Patient was having falls at the facility.  Workup in the ED showed lactic acid 2.2, normalized 1.0 after IV fluids.  Started on IV Zosyn.  Patient has chronic Foley catheter, which was replaced in the ED.  UA consistent with UTI.   Assessment and Plan:  ?  Catheter associated UTI -Due to indwelling Foley catheter -Urine culture growing mixed flora -He was empirically started on IV Zosyn -IV Zosyn was discontinued     Hypertension -Blood pressure is soft -Not on hypertensive meds at home  Recurrent falls -Unclear etiology -Patient is on multiple psychotropic meds including Ativan, temazepam, trazodone -He is also on lithium, lithium level was 0.22 -PT evaluation obtained, patient will go home with home health PT    Diabetes mellitus type 2 - continue home regimen   BPH -Continue tamsulosin, finasteride   Hyponatremia -Resolved, sodium is 134 -Likely from dehydration -IV fluids have been stopped     Iron-deficiency  anemia -Likely from chronic blood loss -Outpatient GI consultation has been scheduled at the end of this month -Continue oral iron supplementation -Hemoglobin stable at 8.0   Cough/ ?  Developing empyema -CT chest obtained on 09/30/2022 -Showed developing empyema in the left pleural space -Discussed with pulmonologist Dr. Francine Graven, he reviewed the CT films and feels that patient does not need any intervention including drain placement versus chest tube -He can be discharged on Augmentin 1 tablet p.o. twice daily for 2 weeks -Will discharge on Augmentin 1 tablet p.o. twice daily for 2 weeks -Continue Mucinex 600 mg p.o. twice daily        Consultants:  Procedures performed:  Disposition: Assisted living Diet recommendation:  Discharge Diet Orders (From admission, onward)     Start     Ordered   10/02/22 0000  Diet - low sodium heart healthy        10/02/22 1525           Carb modified diet DISCHARGE MEDICATION: Allergies as of 10/02/2022   No Known Allergies      Medication List     STOP taking these medications    cephALEXin 500 MG capsule Commonly known as: KEFLEX       TAKE these medications    acetaminophen 500 MG tablet Commonly known as: TYLENOL Take 2 tablets (1,000 mg total) by mouth every 8 (eight) hours as needed.   amoxicillin-clavulanate 875-125 MG tablet Commonly known as: AUGMENTIN Take 1 tablet by mouth 2 (two) times daily for 14 days.   diclofenac Sodium 1 % Gel Commonly known as: VOLTAREN  Apply 2 g topically in the morning and at bedtime. Apply 2 grams to right foot toes, left hip, both knees, and left shoulder TWO times a day   docusate sodium 100 MG capsule Commonly known as: COLACE Take 100 mg by mouth 2 (two) times daily.   escitalopram 20 MG tablet Commonly known as: LEXAPRO Take 20 mg by mouth every morning.   ferrous sulfate 325 (65 FE) MG tablet Take 325 mg by mouth daily.   finasteride 5 MG tablet Commonly known as:  PROSCAR Take 5 mg by mouth every morning.   furosemide 40 MG tablet Commonly known as: LASIX Take 40 mg by mouth daily.   guaiFENesin 600 MG 12 hr tablet Commonly known as: Mucinex Take 1 tablet (600 mg total) by mouth 2 (two) times daily for 10 days.   latanoprost 0.005 % ophthalmic solution Commonly known as: XALATAN Place 1 drop into both eyes every evening.   lithium carbonate 150 MG capsule Take 150 mg by mouth at bedtime.   LORazepam 1 MG tablet Commonly known as: ATIVAN Take 1 mg by mouth 2 (two) times daily.   metFORMIN 500 MG tablet Commonly known as: GLUCOPHAGE Take 500 mg by mouth 2 (two) times daily.   mirabegron ER 25 MG Tb24 tablet Commonly known as: MYRBETRIQ Take 25 mg by mouth in the morning.   nystatin powder Generic drug: nystatin Apply 1 Application topically 3 (three) times daily as needed (to groin, for yeast).   ondansetron 4 MG tablet Commonly known as: ZOFRAN Take 4 mg by mouth daily as needed for nausea or vomiting.   pantoprazole 40 MG tablet Commonly known as: PROTONIX Take 1 tablet (40 mg total) by mouth daily.   polyethylene glycol 17 g packet Commonly known as: MIRALAX / GLYCOLAX Take 17 g by mouth daily as needed for mild constipation. What changed: reasons to take this   senna 8.6 MG Tabs tablet Commonly known as: SENOKOT Take 1 tablet by mouth daily.   sodium chloride 0.65 % Soln nasal spray Commonly known as: OCEAN Place 1 spray into both nostrils 2 (two) times daily.   tamsulosin 0.4 MG Caps capsule Commonly known as: FLOMAX Take 0.4 mg by mouth every evening.   temazepam 15 MG capsule Commonly known as: RESTORIL Take 15 mg by mouth at bedtime.   timolol 0.5 % ophthalmic solution Commonly known as: TIMOPTIC Place 1 drop into both eyes every evening.   traZODone 100 MG tablet Commonly known as: DESYREL Take 100 mg by mouth at bedtime.   vitamin B-12 100 MCG tablet Commonly known as: CYANOCOBALAMIN Take 100 mcg  by mouth every morning.   Vitamin D3 50 MCG (2000 UT) Tabs Take 2,000 Units by mouth every morning.   zinc oxide 20 % ointment Apply 1 application. topically See admin instructions. Apply topically to buttocks every shift        Discharge Exam: There were no vitals filed for this visit. General-appears in no acute distress Heart-S1-S2, regular, no murmur auscultated Lungs-clear to auscultation bilaterally, no wheezing or crackles auscultated Abdomen-soft, nontender, no organomegaly Extremities-no edema in the lower extremities Neuro-alert, oriented x3, no focal deficit noted  Condition at discharge: good  The results of significant diagnostics from this hospitalization (including imaging, microbiology, ancillary and laboratory) are listed below for reference.   Imaging Studies: CT Chest Wo Contrast  Result Date: 09/30/2022 CLINICAL DATA:  Respiratory illness, nondiagnostic xray EXAM: CT CHEST WITHOUT CONTRAST TECHNIQUE: Multidetector CT imaging of the chest was  performed following the standard protocol without IV contrast. RADIATION DOSE REDUCTION: This exam was performed according to the departmental dose-optimization program which includes automated exposure control, adjustment of the mA and/or kV according to patient size and/or use of iterative reconstruction technique. COMPARISON:  07/16/2022 FINDINGS: Cardiovascular: Stable borderline cardiomegaly. Moderate multi-vessel coronary artery calcification. Small pericardial effusion has developed. Central pulmonary arteries are enlarged in keeping with changes of pulmonary arterial hypertension. Moderate atherosclerotic calcification is seen within the thoracic aorta. No aortic aneurysm. Mediastinum/Nodes: 12 mm nodule within the right thyroid lobe. Not clinically significant; no follow-up imaging recommended (ref: J Am Coll Radiol. 2015 Feb;12(2): 143-50).No pathologic thoracic adenopathy. Esophagus unremarkable. Stable 8 mm anterior  mediastinal soft tissue nodule possibly representing a pre-vascular lymph node or small thymoma. Moderate hiatal hernia again noted. Lungs/Pleura: Improving left lower lobe consolidation though multifocal airway impaction persists with peribronchial nodularity and volume loss. Increasing loculation of small left pleural effusion with associated pleural thickening and heterogeneity of the pleural fluid suspicious for a developing empyema. Mild right basilar atelectasis and trace pleural effusion. No pneumothorax. Stable biapical thinly calcified pleural plaques. No central obstructing lesion. Upper Abdomen: No acute abnormality. Musculoskeletal: Stable remote T12 compression deformity. Unchanged accentuated thoracic kyphosis. No acute bone abnormality. No lytic or blastic bone lesion. IMPRESSION: 1. Improving left lower lobe consolidation though multifocal airway impaction persists with peribronchial nodularity and volume loss. 2. Increasing loculation of small left pleural effusion with associated pleural thickening and heterogeneity of the pleural fluid suspicious for a developing empyema. 3. Stable borderline cardiomegaly. Moderate multi-vessel coronary artery calcification. 4. Morphologic changes in keeping with pulmonary arterial hypertension. 5. Moderate hiatal hernia. 6. Stable 8 mm anterior mediastinal soft tissue nodule possibly representing a pre-vascular lymph node or small thymoma. Aortic Atherosclerosis (ICD10-I70.0). Electronically Signed   By: Helyn Numbers M.D.   On: 09/30/2022 12:22   CT Head Wo Contrast  Result Date: 09/30/2022 CLINICAL DATA:  Mental status change. Frequent falls. Increased confusion. EXAM: CT HEAD WITHOUT CONTRAST TECHNIQUE: Contiguous axial images were obtained from the base of the skull through the vertex without intravenous contrast. RADIATION DOSE REDUCTION: This exam was performed according to the departmental dose-optimization program which includes automated exposure  control, adjustment of the mA and/or kV according to patient size and/or use of iterative reconstruction technique. COMPARISON:  07/27/2020 FINDINGS: Brain: No evidence of acute infarction, hemorrhage, hydrocephalus, extra-axial collection or mass lesion/mass effect. There is mild diffuse low-attenuation within the subcortical and periventricular white matter compatible with chronic microvascular disease. Prominence of the sulci and ventricles compatible with brain atrophy. Vascular: There is mild diffuse low-attenuation within the subcortical and periventricular white matter compatible with chronic microvascular disease. Skull: Normal. Negative for fracture or focal lesion. Sinuses/Orbits: No acute abnormality Other: None. IMPRESSION: 1. No acute intracranial abnormalities. 2. Chronic microvascular disease and brain atrophy. Electronically Signed   By: Signa Kell M.D.   On: 09/30/2022 11:56   DG Hip Unilat W or Wo Pelvis 2-3 Views Left  Result Date: 09/30/2022 CLINICAL DATA:  Pain after fall. EXAM: DG HIP (WITH OR WITHOUT PELVIS) 2-3V LEFT COMPARISON:  08/02/2021 FINDINGS: Status post ORIF of the left femur. IM rod and nail appears stable from previous exam. No signs of acute fracture or dislocation. Surgical clips noted within the soft tissues adjacent to the left hip. IMPRESSION: 1. No acute findings. 2. Status post ORIF of left femur. Electronically Signed   By: Signa Kell M.D.   On: 09/30/2022 10:57   DG  Chest Port 1 View  Result Date: 09/30/2022 CLINICAL DATA:  Cough.  Confusion. EXAM: PORTABLE CHEST 1 VIEW COMPARISON:  07/16/2022 FINDINGS: Low volume kyphotic film. Patient's face obscures the lung apices. The cardio pericardial silhouette is enlarged. Retrocardiac left base collapse/consolidation again noted and small left pleural effusion cannot be excluded. Probable atelectasis at the right base with trace right pleural effusion. Interstitial markings are diffusely coarsened with chronic  features. Bones are diffusely demineralized. Telemetry leads overlie the chest. IMPRESSION: Low volume kyphotic film with persistent retrocardiac left base collapse/consolidation and small left pleural effusion. Electronically Signed   By: Kennith Center M.D.   On: 09/30/2022 10:56   US Renal  Result Date: 09/03/2022 CLINICAL DATA:  Hematuria EXAM: RENAL / URINARY TRACT ULTRASOUND COMPLETE COMPARISON:  CT abdomen and pelvis with contrast July 16, 2022 FINDINGS: Right Kidney: Renal measurements: 8.8 x 4.0 x 4.8 cm = volume: 87 mL. Echogenicity within normal limits. No mass or hydronephrosis visualized. Left Kidney: Renal measurements: 10.9 x 4.8 x 5.6 cm = volume: 151.2 mL. Echogenicity within normal limits. No mass or hydronephrosis visualized. Bladder: Decompressed with Foley catheter in place Other: None. IMPRESSION: Normal sonographic appearance of bilateral kidneys. Electronically Signed   By: Jacob Moores M.D.   On: 09/03/2022 12:44    Microbiology: Results for orders placed or performed during the hospital encounter of 09/30/22  Culture, blood (routine x 2)     Status: None (Preliminary result)   Collection Time: 09/30/22 10:55 AM   Specimen: BLOOD LEFT WRIST  Result Value Ref Range Status   Specimen Description   Final    BLOOD LEFT WRIST Performed at [facility], [address] [place], [place] [address]    Special Requests   Final    BOTTLES DRAWN AEROBIC ONLY Blood Culture adequate volume Performed at [facility], [address] [place], [place] [address]    Culture   Final    NO GROWTH 2 DAYS Performed at [facility], [address] [place], [place] [address]    Report Status PENDING  Incomplete  Culture, blood (routine x 2)     Status: None (Preliminary result)   Collection Time: 09/30/22 10:55 AM   Specimen: BLOOD  Result Value Ref Range Status   Specimen Description   Final    BLOOD RIGHT ANTECUBITAL Performed at [facility], [address], [place], [place] [address]    Special Requests   Final    BOTTLES DRAWN AEROBIC AND ANAEROBIC Blood Culture adequate volume Performed at [facility], [address] [place], [place] [address]    Culture   Final    NO GROWTH 2 DAYS Performed at [facility], [address] [place], [place] [address]    Report Status PENDING  Incomplete  SARS Coronavirus 2 by RT PCR (hospital order, performed in [facility] hospital lab) *cepheid single result test*     Status: None   Collection Time: 09/30/22 10:55 AM   Specimen: Nasal Swab  Result Value Ref Range Status   SARS Coronavirus 2 by RT PCR NEGATIVE NEGATIVE Final    Comment: (NOTE) SARS-CoV-2 target nucleic acids are NOT DETECTED.  The SARS-CoV-2 RNA is generally detectable in upper and lower respiratory specimens during the acute phase of infection. The lowest concentration of SARS-CoV-2 viral copies this assay can detect is 250 copies / mL. A negative result does not preclude SARS-CoV-2 infection and should not be  used as the sole basis for treatment or other patient management decisions.  A negative result may occur with improper specimen collection / handling, submission of specimen other than nasopharyngeal swab, presence of viral mutation(s) within the areas targeted by this assay, and inadequate number of viral copies (<250 copies / mL). A negative result must be combined with clinical observations, patient history, and epidemiological information.  Fact Sheet for Patients:   RoadLapTop.co.za  Fact Sheet for Healthcare Providers: http://kim-miller.com/  This test is not yet approved or  cleared by the [place] FDA and has been authorized for detection and/or diagnosis of SARS-CoV-2 by FDA under an Emergency Use Authorization (EUA).  This EUA will remain in effect (meaning this test can be used) for the duration of  the COVID-19 declaration under Section 564(b)(1) of the Act, 21 U.S.C. section 360bbb-3(b)(1), unless the authorization is terminated or revoked sooner.  Performed at [facility], [address], [place], [place] [address]   Urine Culture     Status: Abnormal   Collection Time: 09/30/22 11:22 AM   Specimen: Urine, Random  Result Value Ref Range Status   Specimen Description   Final    URINE, RANDOM Performed at [facility], [address] [place], [place] [address]    Special Requests   Final    URINE, CATHETERIZED Performed at [facility], [address], [place], [place] [address]    Culture MULTIPLE SPECIES PRESENT, SUGGEST RECOLLECTION (A)  Final   Report Status 10/02/2022 FINAL  Final    Labs: CBC: Recent Labs  Lab 09/30/22 1030 10/01/22 1041  WBC 9.0 6.1  NEUTROABS 5.6  --   HGB 7.9* 8.0*  HCT 25.6* 26.4*  MCV 92.8 94.6  PLT 254 268   Basic Metabolic Panel: Recent Labs  Lab 09/30/22 1030 10/01/22 1041  NA 126* 134*  K 3.8 4.0  CL 96* 103  CO2 23 24  GLUCOSE 133* 168*  BUN 12 9  CREATININE 0.83 0.79  CALCIUM 8.1* 8.2*   Liver Function Tests: Recent Labs  Lab 09/30/22 1030 10/01/22 1041  AST 19 14*  ALT 8 10  ALKPHOS 76 85  BILITOT 0.5 0.4  PROT 6.2* 6.6  ALBUMIN 2.4* 2.4*   CBG: Recent Labs  Lab 10/01/22 1110 10/01/22 1633 10/01/22 2115 10/02/22 0738 10/02/22 1139  GLUCAP 166* 84 122* 107* 109*    Discharge time spent: greater than 30 minutes.  Signed: Meredeth Ide, MD [facility] 10/02/2022

## 2022-10-02 NOTE — Progress Notes (Addendum)
[facility]  PROGRESS NOTE  Russell Ayers ZOX:[id number] DOB: 03-01-1933 DOA: 09/30/2022 PCP: Patient, No Pcp Per   Brief HPI:   89 year old male with medical history of NIDDM type II, chronic deficiency anemia, generalized anxiety disorder, hypertension, history of seizure, history of stroke admitted to hospital with catheter associated UTI.  Patient was having falls at the facility.  Workup in the ED showed lactic acid 2.2, normalized 1.0 after IV fluids.  Started on IV Zosyn.  Patient has chronic Foley catheter, which was replaced in the ED.  UA consistent with UTI.      Assessment/Plan:    Catheter associated UTI -Due to indwelling Foley catheter -Urine culture growing mixed flora -He was empirically started on IV Zosyn, will discontinue antibiotics   Hypertension -Blood pressure is soft -Not on hypertensive meds at home -Continue to monitor patient's blood pressures in the hospital  Diabetes mellitus type 2 -CBG well-controlled -Continue moderate sliding scale with NovoLog  BPH -Continue tamsulosin, finasteride  Hyponatremia -Resolved, sodium is 134 -Likely from dehydration -IV fluids have been stopped   Iron-deficiency anemia -Likely from chronic blood loss -Outpatient GI consultation has been scheduled at the end of this month -Continue oral iron supplementation -Hemoglobin stable at 8.0  Cough -CT chest obtained on 09/30/2022 -Showed developing empyema in the left pleural space -Will consult pulmonology for further recommendations -Continue Mucinex 600 mg p.o. twice daily -Flutter valve every 4 hours -IV fluids stopped.  Will obtain PT evaluation, patient resides at assisted living facility  Medications     Chlorhexidine Gluconate Cloth  6 each Topical Daily   docusate sodium  100 mg Oral BID   escitalopram  20 mg Oral q morning   ferrous sulfate  325 mg Oral Daily   finasteride  5 mg Oral q morning   guaiFENesin  600 mg Oral BID   insulin  aspart  0-15 Units Subcutaneous TID WC   insulin aspart  0-5 Units Subcutaneous QHS   lithium carbonate  150 mg Oral QHS   LORazepam  1 mg Oral BID   mirabegron ER  25 mg Oral Daily   tamsulosin  0.4 mg Oral QPM   temazepam  15 mg Oral QHS   timolol  1 drop Both Eyes QPM   traZODone  100 mg Oral QHS     Data Reviewed:   CBG:  Recent Labs  Lab 10/01/22 0731 10/01/22 1110 10/01/22 1633 10/01/22 2115 10/02/22 0738  GLUCAP 106* 166* 84 122* 107*    SpO2: 97 %    Vitals:   10/01/22 0913 10/01/22 1330 10/01/22 2058 10/02/22 0420  BP: (!) 108/42 (!) 147/65 (!) 118/50 136/69  Pulse: 68 67 60 81  Resp:   17 18  Temp: 97.8 F (36.6 C)  98.7 F (37.1 C) 98.7 F (37.1 C)  TempSrc: Oral  Oral   SpO2: 95% 99% 97% 97%      Data Reviewed:  Basic Metabolic Panel: Recent Labs  Lab 09/30/22 1030 10/01/22 1041  NA 126* 134*  K 3.8 4.0  CL 96* 103  CO2 23 24  GLUCOSE 133* 168*  BUN 12 9  CREATININE 0.83 0.79  CALCIUM 8.1* 8.2*    CBC: Recent Labs  Lab 09/30/22 1030 10/01/22 1041  WBC 9.0 6.1  NEUTROABS 5.6  --   HGB 7.9* 8.0*  HCT 25.6* 26.4*  MCV 92.8 94.6  PLT 254 268    LFT Recent Labs  Lab 09/30/22 1030 10/01/22 1041  AST 19  14*  ALT 8 10  ALKPHOS 76 85  BILITOT 0.5 0.4  PROT 6.2* 6.6  ALBUMIN 2.4* 2.4*     Antibiotics: Anti-infectives (From admission, onward)    Start     Dose/Rate Route Frequency Ordered Stop   09/30/22 2200  piperacillin-tazobactam (ZOSYN) IVPB 3.375 g        3.375 g 12.5 mL/hr over 240 Minutes Intravenous Every 8 hours 09/30/22 1506     09/30/22 1515  piperacillin-tazobactam (ZOSYN) IVPB 3.375 g  Status:  Discontinued        3.375 g 100 mL/hr over 30 Minutes Intravenous Every 8 hours 09/30/22 1502 09/30/22 1505   09/30/22 1400  piperacillin-tazobactam (ZOSYN) IVPB 3.375 g        3.375 g 100 mL/hr over 30 Minutes Intravenous  Once 09/30/22 1348 09/30/22 1526   09/30/22 1200  cefTRIAXone (ROCEPHIN) 1 g in sodium  chloride 0.9 % 100 mL IVPB        1 g 200 mL/hr over 30 Minutes Intravenous  Once 09/30/22 1146 09/30/22 1403        DVT prophylaxis: SCDs  Code Status: DNR  Family Communication:    CONSULTS    Subjective   Feels better this morning.  Denies coughing up phlegm.   Objective    Physical Examination:  Appears in no acute distress S1-S2, regular Lungs bilateral rhonchi auscultated Abdomen soft, nontender, no organomegaly  Status is: Inpatient:             Meredeth Ide   [facility] If 7PM-7AM, please contact night-coverage at [email or web address], Office  [phone]   10/02/2022, 10:59 AM  LOS: 2 days

## 2022-10-05 LAB — CULTURE, BLOOD (ROUTINE X 2)
Culture: NO GROWTH
Culture: NO GROWTH
Special Requests: ADEQUATE
Special Requests: ADEQUATE

## 2022-10-20 ENCOUNTER — Ambulatory Visit: Payer: Medicare Other | Admitting: Nurse Practitioner

## 2022-10-27 ENCOUNTER — Ambulatory Visit: Payer: Medicare Other | Admitting: Gastroenterology

## 2022-11-25 ENCOUNTER — Inpatient Hospital Stay (HOSPITAL_COMMUNITY): Payer: Medicare Other

## 2022-11-25 ENCOUNTER — Emergency Department (HOSPITAL_COMMUNITY): Payer: Medicare Other

## 2022-11-25 ENCOUNTER — Inpatient Hospital Stay (HOSPITAL_COMMUNITY)
Admission: EM | Admit: 2022-11-25 | Discharge: 2022-11-28 | DRG: 698 | Disposition: A | Discharge status: Nursing Home (Includes ICF) | Payer: Medicare Other | Source: Skilled Nursing Facility | Attending: Internal Medicine | Admitting: Internal Medicine

## 2022-11-25 ENCOUNTER — Other Ambulatory Visit: Payer: Self-pay

## 2022-11-25 DIAGNOSIS — Z8249 Family history of ischemic heart disease and other diseases of the circulatory system: Secondary | ICD-10-CM

## 2022-11-25 DIAGNOSIS — R55 Syncope and collapse: Secondary | ICD-10-CM | POA: Diagnosis present

## 2022-11-25 DIAGNOSIS — F5101 Primary insomnia: Secondary | ICD-10-CM | POA: Diagnosis present

## 2022-11-25 DIAGNOSIS — I10 Essential (primary) hypertension: Secondary | ICD-10-CM | POA: Diagnosis present

## 2022-11-25 DIAGNOSIS — M1990 Unspecified osteoarthritis, unspecified site: Secondary | ICD-10-CM | POA: Diagnosis present

## 2022-11-25 DIAGNOSIS — N400 Enlarged prostate without lower urinary tract symptoms: Secondary | ICD-10-CM | POA: Diagnosis present

## 2022-11-25 DIAGNOSIS — Y846 Urinary catheterization as the cause of abnormal reaction of the patient, or of later complication, without mention of misadventure at the time of the procedure: Secondary | ICD-10-CM | POA: Diagnosis present

## 2022-11-25 DIAGNOSIS — B3749 Other urogenital candidiasis: Secondary | ICD-10-CM | POA: Diagnosis present

## 2022-11-25 DIAGNOSIS — H409 Unspecified glaucoma: Secondary | ICD-10-CM | POA: Diagnosis present

## 2022-11-25 DIAGNOSIS — D509 Iron deficiency anemia, unspecified: Secondary | ICD-10-CM | POA: Diagnosis present

## 2022-11-25 DIAGNOSIS — T83518A Infection and inflammatory reaction due to other urinary catheter, initial encounter: Principal | ICD-10-CM | POA: Diagnosis present

## 2022-11-25 DIAGNOSIS — G4700 Insomnia, unspecified: Secondary | ICD-10-CM | POA: Diagnosis present

## 2022-11-25 DIAGNOSIS — F411 Generalized anxiety disorder: Secondary | ICD-10-CM | POA: Diagnosis present

## 2022-11-25 DIAGNOSIS — Z8673 Personal history of transient ischemic attack (TIA), and cerebral infarction without residual deficits: Secondary | ICD-10-CM

## 2022-11-25 DIAGNOSIS — R296 Repeated falls: Secondary | ICD-10-CM | POA: Diagnosis present

## 2022-11-25 DIAGNOSIS — Z66 Do not resuscitate: Secondary | ICD-10-CM | POA: Diagnosis present

## 2022-11-25 DIAGNOSIS — W19XXXA Unspecified fall, initial encounter: Secondary | ICD-10-CM | POA: Diagnosis present

## 2022-11-25 DIAGNOSIS — Z7984 Long term (current) use of oral hypoglycemic drugs: Secondary | ICD-10-CM | POA: Diagnosis not present

## 2022-11-25 DIAGNOSIS — Z681 Body mass index (BMI) 19 or less, adult: Secondary | ICD-10-CM

## 2022-11-25 DIAGNOSIS — T83511A Infection and inflammatory reaction due to indwelling urethral catheter, initial encounter: Secondary | ICD-10-CM | POA: Diagnosis not present

## 2022-11-25 DIAGNOSIS — I214 Non-ST elevation (NSTEMI) myocardial infarction: Secondary | ICD-10-CM | POA: Diagnosis present

## 2022-11-25 DIAGNOSIS — Z79899 Other long term (current) drug therapy: Secondary | ICD-10-CM | POA: Diagnosis not present

## 2022-11-25 DIAGNOSIS — N390 Urinary tract infection, site not specified: Secondary | ICD-10-CM | POA: Diagnosis not present

## 2022-11-25 DIAGNOSIS — R7989 Other specified abnormal findings of blood chemistry: Secondary | ICD-10-CM | POA: Diagnosis present

## 2022-11-25 DIAGNOSIS — Z515 Encounter for palliative care: Secondary | ICD-10-CM | POA: Diagnosis not present

## 2022-11-25 DIAGNOSIS — E872 Acidosis, unspecified: Secondary | ICD-10-CM | POA: Diagnosis present

## 2022-11-25 DIAGNOSIS — A419 Sepsis, unspecified organism: Secondary | ICD-10-CM | POA: Diagnosis present

## 2022-11-25 DIAGNOSIS — Z7189 Other specified counseling: Secondary | ICD-10-CM | POA: Diagnosis not present

## 2022-11-25 DIAGNOSIS — Z87891 Personal history of nicotine dependence: Secondary | ICD-10-CM

## 2022-11-25 DIAGNOSIS — N401 Enlarged prostate with lower urinary tract symptoms: Secondary | ICD-10-CM | POA: Diagnosis not present

## 2022-11-25 DIAGNOSIS — I959 Hypotension, unspecified: Secondary | ICD-10-CM | POA: Diagnosis present

## 2022-11-25 DIAGNOSIS — E43 Unspecified severe protein-calorie malnutrition: Secondary | ICD-10-CM | POA: Diagnosis present

## 2022-11-25 DIAGNOSIS — E1169 Type 2 diabetes mellitus with other specified complication: Secondary | ICD-10-CM | POA: Diagnosis present

## 2022-11-25 LAB — CBC WITH DIFFERENTIAL/PLATELET
Abs Immature Granulocytes: 0.13 10*3/uL — ABNORMAL HIGH (ref 0.00–0.07)
Basophils Absolute: 0.1 10*3/uL (ref 0.0–0.1)
Basophils Relative: 0 %
Eosinophils Absolute: 0.1 10*3/uL (ref 0.0–0.5)
Eosinophils Relative: 0 %
HCT: 26.1 % — ABNORMAL LOW (ref 39.0–52.0)
Hemoglobin: 7.9 g/dL — ABNORMAL LOW (ref 13.0–17.0)
Immature Granulocytes: 1 %
Lymphocytes Relative: 4 %
Lymphs Abs: 0.7 10*3/uL (ref 0.7–4.0)
MCH: 27 pg (ref 26.0–34.0)
MCHC: 30.3 g/dL (ref 30.0–36.0)
MCV: 89.1 fL (ref 80.0–100.0)
Monocytes Absolute: 0.9 10*3/uL (ref 0.1–1.0)
Monocytes Relative: 5 %
Neutro Abs: 17.4 10*3/uL — ABNORMAL HIGH (ref 1.7–7.7)
Neutrophils Relative %: 90 %
Platelets: 456 10*3/uL — ABNORMAL HIGH (ref 150–400)
RBC: 2.93 MIL/uL — ABNORMAL LOW (ref 4.22–5.81)
RDW: 14.1 % (ref 11.5–15.5)
WBC: 19.1 10*3/uL — ABNORMAL HIGH (ref 4.0–10.5)
nRBC: 0 % (ref 0.0–0.2)

## 2022-11-25 LAB — GLUCOSE, CAPILLARY: Glucose-Capillary: 132 mg/dL — ABNORMAL HIGH (ref 70–99)

## 2022-11-25 LAB — BASIC METABOLIC PANEL
Anion gap: 17 — ABNORMAL HIGH (ref 5–15)
BUN: 11 mg/dL (ref 8–23)
CO2: 19 mmol/L — ABNORMAL LOW (ref 22–32)
Calcium: 8.8 mg/dL — ABNORMAL LOW (ref 8.9–10.3)
Chloride: 97 mmol/L — ABNORMAL LOW (ref 98–111)
Creatinine, Ser: 1.15 mg/dL (ref 0.61–1.24)
GFR, Estimated: >60 mL/min (ref >60)
Glucose, Bld: 149 mg/dL — ABNORMAL HIGH (ref 70–99)
Potassium: 3.9 mmol/L (ref 3.5–5.1)
Sodium: 133 mmol/L — ABNORMAL LOW (ref 135–145)

## 2022-11-25 LAB — URINALYSIS, ROUTINE W REFLEX MICROSCOPIC
Bilirubin Urine: NEGATIVE
Glucose, UA: NEGATIVE mg/dL
Hgb urine dipstick: NEGATIVE
Ketones, ur: NEGATIVE mg/dL
Nitrite: NEGATIVE
Protein, ur: 30 mg/dL — AB
RBC / HPF: >50 RBC/hpf (ref 0–5)
Specific Gravity, Urine: 1.012 (ref 1.005–1.030)
WBC, UA: >50 WBC/hpf (ref 0–5)
pH: 6 (ref 5.0–8.0)

## 2022-11-25 LAB — LACTIC ACID, PLASMA
Lactic Acid, Venous: 4.3 mmol/L (ref 0.5–1.9)
Lactic Acid, Venous: 2.1 mmol/L (ref 0.5–1.9)

## 2022-11-25 LAB — TROPONIN I (HIGH SENSITIVITY)
Troponin I (High Sensitivity): 35 ng/L — ABNORMAL HIGH (ref <18)
Troponin I (High Sensitivity): 140 ng/L (ref <18)
Troponin I (High Sensitivity): 130 ng/L (ref <18)

## 2022-11-25 MED ORDER — LORAZEPAM 1 MG PO TABS: 1.0000 mg | ORAL_TABLET | Freq: Two times a day (BID) | ORAL | Status: DC | PRN

## 2022-11-25 MED ORDER — SODIUM CHLORIDE 0.9 % IV SOLN: INTRAVENOUS | Status: DC

## 2022-11-25 MED ORDER — ASPIRIN 81 MG PO CHEW
81.0000 mg | CHEWABLE_TABLET | Freq: Every day | ORAL | Status: DC
  Administered 2022-11-25 – 2022-11-28 (×4): 81 mg via ORAL
  Filled 2022-11-25 (×4): qty 1

## 2022-11-25 MED ORDER — ACETAMINOPHEN 500 MG PO TABS: 1000.0000 mg | ORAL_TABLET | Freq: Three times a day (TID) | ORAL | Status: DC | PRN

## 2022-11-25 MED ORDER — SODIUM CHLORIDE 0.9 % IV BOLUS
1000.0000 mL | Freq: Once | INTRAVENOUS | Status: AC
  Administered 2022-11-25: 1000 mL via INTRAVENOUS

## 2022-11-25 MED ORDER — TEMAZEPAM 15 MG PO CAPS
15.0000 mg | ORAL_CAPSULE | Freq: Every day | ORAL | Status: DC
  Administered 2022-11-25 – 2022-11-27 (×3): 15 mg via ORAL
  Filled 2022-11-25 (×3): qty 1

## 2022-11-25 MED ORDER — INSULIN ASPART 100 UNIT/ML IJ SOLN
0.0000 [IU] | Freq: Three times a day (TID) | INTRAMUSCULAR | Status: DC
  Administered 2022-11-27: 1 [IU] via SUBCUTANEOUS

## 2022-11-25 MED ORDER — ENOXAPARIN SODIUM 30 MG/0.3ML IJ SOSY
30.0000 mg | PREFILLED_SYRINGE | INTRAMUSCULAR | Status: DC
  Administered 2022-11-25: 30 mg via SUBCUTANEOUS
  Filled 2022-11-25: qty 0.3

## 2022-11-25 MED ORDER — TAMSULOSIN HCL 0.4 MG PO CAPS
0.4000 mg | ORAL_CAPSULE | Freq: Every evening | ORAL | Status: DC
  Administered 2022-11-25 – 2022-11-27 (×3): 0.4 mg via ORAL
  Filled 2022-11-25 (×3): qty 1

## 2022-11-25 MED ORDER — DICLOFENAC SODIUM 1 % EX GEL
2.0000 g | Freq: Two times a day (BID) | CUTANEOUS | Status: DC
  Administered 2022-11-25 – 2022-11-28 (×5): 2 g via TOPICAL
  Filled 2022-11-25: qty 100

## 2022-11-25 MED ORDER — CHLORHEXIDINE GLUCONATE CLOTH 2 % EX PADS
6.0000 | MEDICATED_PAD | Freq: Every day | CUTANEOUS | Status: DC
  Administered 2022-11-25 – 2022-11-28 (×4): 6 via TOPICAL

## 2022-11-25 MED ORDER — MIRABEGRON ER 25 MG PO TB24
25.0000 mg | ORAL_TABLET | Freq: Every day | ORAL | Status: DC
  Administered 2022-11-26 – 2022-11-28 (×3): 25 mg via ORAL
  Filled 2022-11-25 (×3): qty 1

## 2022-11-25 MED ORDER — SODIUM CHLORIDE 0.9 % IV BOLUS
500.0000 mL | Freq: Once | INTRAVENOUS | Status: AC
  Administered 2022-11-25: 500 mL via INTRAVENOUS

## 2022-11-25 MED ORDER — PANTOPRAZOLE SODIUM 40 MG PO TBEC
40.0000 mg | DELAYED_RELEASE_TABLET | Freq: Every day | ORAL | Status: DC
  Administered 2022-11-26 – 2022-11-28 (×3): 40 mg via ORAL
  Filled 2022-11-25 (×3): qty 1

## 2022-11-25 MED ORDER — LATANOPROST 0.005 % OP SOLN
1.0000 [drp] | Freq: Every evening | OPHTHALMIC | Status: DC
  Administered 2022-11-25 – 2022-11-27 (×3): 1 [drp] via OPHTHALMIC
  Filled 2022-11-25: qty 2.5

## 2022-11-25 MED ORDER — SODIUM CHLORIDE 0.9 % IV SOLN
1.0000 g | INTRAVENOUS | Status: DC
  Administered 2022-11-26: 1 g via INTRAVENOUS
  Filled 2022-11-25: qty 10

## 2022-11-25 MED ORDER — LACTATED RINGERS IV BOLUS: 500.0000 mL | Freq: Once | INTRAVENOUS | Status: DC

## 2022-11-25 MED ORDER — ENOXAPARIN SODIUM 40 MG/0.4ML IJ SOSY
40.0000 mg | PREFILLED_SYRINGE | INTRAMUSCULAR | Status: DC
  Administered 2022-11-26 – 2022-11-27 (×2): 40 mg via SUBCUTANEOUS
  Filled 2022-11-25 (×2): qty 0.4

## 2022-11-25 MED ORDER — FERROUS SULFATE 325 (65 FE) MG PO TABS
325.0000 mg | ORAL_TABLET | Freq: Every day | ORAL | Status: DC
  Administered 2022-11-26 – 2022-11-28 (×3): 325 mg via ORAL
  Filled 2022-11-25 (×3): qty 1

## 2022-11-25 MED ORDER — ESCITALOPRAM OXALATE 10 MG PO TABS
20.0000 mg | ORAL_TABLET | Freq: Every day | ORAL | Status: DC
  Administered 2022-11-26 – 2022-11-28 (×3): 20 mg via ORAL
  Filled 2022-11-25 (×3): qty 2

## 2022-11-25 MED ORDER — FINASTERIDE 5 MG PO TABS
5.0000 mg | ORAL_TABLET | Freq: Every day | ORAL | Status: DC
  Administered 2022-11-26 – 2022-11-28 (×3): 5 mg via ORAL
  Filled 2022-11-25 (×3): qty 1

## 2022-11-25 MED ORDER — SODIUM CHLORIDE 0.9 % IV SOLN
1.0000 g | Freq: Once | INTRAVENOUS | Status: AC
  Administered 2022-11-25: 1 g via INTRAVENOUS
  Filled 2022-11-25: qty 10

## 2022-11-25 MED ORDER — LITHIUM CARBONATE 150 MG PO CAPS
150.0000 mg | ORAL_CAPSULE | Freq: Every day | ORAL | Status: DC
  Administered 2022-11-25 – 2022-11-27 (×3): 150 mg via ORAL
  Filled 2022-11-25 (×3): qty 1

## 2022-11-25 MED ORDER — TIMOLOL MALEATE 0.5 % OP SOLN
1.0000 [drp] | Freq: Every evening | OPHTHALMIC | Status: DC
  Administered 2022-11-25 – 2022-11-27 (×3): 1 [drp] via OPHTHALMIC
  Filled 2022-11-25: qty 5

## 2022-11-25 MED ORDER — VITAMIN B-12 100 MCG PO TABS
100.0000 ug | ORAL_TABLET | Freq: Every day | ORAL | Status: DC
  Administered 2022-11-26 – 2022-11-27 (×2): 100 ug via ORAL
  Filled 2022-11-25 (×3): qty 1

## 2022-11-25 MED ORDER — TRAZODONE HCL 50 MG PO TABS
100.0000 mg | ORAL_TABLET | Freq: Every day | ORAL | Status: DC
  Administered 2022-11-25 – 2022-11-27 (×3): 100 mg via ORAL
  Filled 2022-11-25 (×3): qty 2

## 2022-11-25 NOTE — ED Provider Notes (Signed)
Patient care transferred to me.  Patient had what sounds like syncope and briefly had to be bag-valve-mask ventilated.  He is currently awake and alert and at his baseline according to daughter.  His urine could represent a UTI that was always hard to tell with a Foley catheter specimen.  However in the setting of soft blood pressures (90s), elevated lactate, and the elevated WBC, I am concerned that he has sepsis.  Will give a liter of IV fluids (already received 500), start him on Rocephin, and admit to the hospital.  Troponin is minimally elevated though it is unclear if this is related to a secondary process rather than a primary cardiac process.  .Critical Care  Performed by: Pricilla Loveless, MD Authorized by: Pricilla Loveless, MD   Critical care provider statement:    Critical care time (minutes):  30   Critical care time was exclusive of:  Separately billable procedures and treating other patients   Critical care was necessary to treat or prevent imminent or life-threatening deterioration of the following conditions:  Sepsis and cardiac failure   Critical care was time spent personally by me on the following activities:  Development of treatment plan with patient or surrogate, discussions with consultants, evaluation of patient's response to treatment, examination of patient, ordering and review of laboratory studies, ordering and review of radiographic studies, ordering and performing treatments and interventions, pulse oximetry, re-evaluation of patient's condition and review of old charts     Pricilla Loveless, MD 11/25/22 2318

## 2022-11-25 NOTE — ED Provider Notes (Signed)
Russell Ayers Provider Note   CSN: [id number] Arrival date & time: 11/25/22  1402     History {Add pertinent medical, surgical, social history, OB history to HPI:1} Chief Complaint  Patient presents with   Fall    Pt coming from SNF post syncope/fall. Pt is a&ox4, pale, warm and dry. Pt denies any pain.     Meshal Breuninger [place] is a 89 y.o. male.   Fall     Patient has a history of hypertension glaucoma insomnia stroke pneumonia anemia diabetes, bowel obstruction, seizures who presents ED for evaluation of a fall and possible syncopal episode.  Patient does not remember what happened.  He is currently not having any trouble with headache or chest pain.  He denies any new numbness or weakness.  According to the EMS report however there was a period of apnea with his syncopal episode.  EMS provided bag-valve-mask ventilation temporarily.  He initially was then placed on supplemental oxygen  Home Medications Prior to Admission medications   Medication Sig Start Date End Date Taking? Authorizing Provider  acetaminophen (TYLENOL) 500 MG tablet Take 2 tablets (1,000 mg total) by mouth every 8 (eight) hours as needed. 07/18/22   Burnadette Pop, MD  Cholecalciferol (VITAMIN D3) 50 MCG (2000 UT) TABS Take 2,000 Units by mouth every morning.    [provider]  diclofenac Sodium (VOLTAREN) 1 % GEL Apply 2 g topically in the morning and at bedtime. Apply 2 grams to right foot toes, left hip, both knees, and left shoulder TWO times a day    [provider]  docusate sodium (COLACE) 100 MG capsule Take 100 mg by mouth 2 (two) times daily.    [provider]  escitalopram (LEXAPRO) 20 MG tablet Take 20 mg by mouth every morning. 09/26/21   [provider]  ferrous sulfate 325 (65 FE) MG tablet Take 325 mg by mouth daily.    [provider]  finasteride (PROSCAR) 5 MG tablet Take 5 mg by mouth every morning. 09/30/18    [provider]  furosemide (LASIX) 40 MG tablet Take 40 mg by mouth daily.    [provider]  latanoprost (XALATAN) 0.005 % ophthalmic solution Place 1 drop into both eyes every evening. 11/20/18   [provider]  lithium carbonate 150 MG capsule Take 150 mg by mouth at bedtime.    [provider]  LORazepam (ATIVAN) 1 MG tablet Take 1 mg by mouth 2 (two) times daily. 09/24/21   [provider]  metFORMIN (GLUCOPHAGE) 500 MG tablet Take 500 mg by mouth 2 (two) times daily. 09/28/21   [provider]  mirabegron ER (MYRBETRIQ) 25 MG TB24 tablet Take 25 mg by mouth in the morning.    [provider]  nystatin powder Apply 1 Application topically 3 (three) times daily as needed (to groin, for yeast).    [provider]  ondansetron (ZOFRAN) 4 MG tablet Take 4 mg by mouth daily as needed for nausea or vomiting.    [provider]  pantoprazole (PROTONIX) 40 MG tablet Take 1 tablet (40 mg total) by mouth daily. 07/19/22   Burnadette Pop, MD  polyethylene glycol (MIRALAX / GLYCOLAX) 17 g packet Take 17 g by mouth daily as needed for mild constipation. Patient taking differently: Take 17 g by mouth daily as needed for mild constipation (to be mixed with 4-6 ounces of water). 02/08/21   Burnadette Pop, MD  senna Mancel Parsons)  8.6 MG TABS tablet Take 1 tablet by mouth daily.    [provider]  sodium chloride (OCEAN) 0.65 % SOLN nasal spray Place 1 spray into both nostrils 2 (two) times daily.    [provider]  tamsulosin (FLOMAX) 0.4 MG CAPS capsule Take 0.4 mg by mouth every evening. 11/25/20   [provider]  temazepam (RESTORIL) 15 MG capsule Take 15 mg by mouth at bedtime. 09/19/21   [provider]  timolol (TIMOPTIC) 0.5 % ophthalmic solution Place 1 drop into both eyes every evening. 08/27/21   [provider]  traZODone (DESYREL) 100 MG tablet Take 100 mg by mouth at bedtime.     [provider]  vitamin B-12 (CYANOCOBALAMIN) 100 MCG tablet Take 100 mcg by mouth every morning.    [provider]  zinc oxide 20 % ointment Apply 1 application. topically See admin instructions. Apply topically to buttocks every shift    [provider]      Allergies    Patient has no known allergies.    Review of Systems   Review of Systems  Physical Exam Updated Vital Signs BP (!) 101/56 (BP Location: Right Arm)   Pulse 96   Temp 97.8 F (36.6 C) (Oral)   Resp 19   Ht 1.803 m (5\' 11" )   Wt 63.5 kg   SpO2 95%   BMI 19.53 kg/m  Physical Exam Vitals and nursing note reviewed.  Constitutional:      Appearance: He is well-developed.     Comments: Elderly, frail  HENT:     Head: Normocephalic and atraumatic.     Right Ear: External ear normal.     Left Ear: External ear normal.  Eyes:     General: No scleral icterus.       Right eye: No discharge.        Left eye: No discharge.     Conjunctiva/sclera: Conjunctivae normal.  Neck:     Trachea: No tracheal deviation.  Cardiovascular:     Rate and Rhythm: Normal rate and regular rhythm.  Pulmonary:     Effort: Pulmonary effort is normal. No respiratory distress.     Breath sounds: Normal breath sounds. No stridor. No wheezing or rales.  Abdominal:     General: Bowel sounds are normal. There is no distension.     Palpations: Abdomen is soft.     Tenderness: There is no abdominal tenderness. There is no guarding or rebound.  Musculoskeletal:        General: No tenderness or deformity.     Cervical back: Normal and neck supple.     Thoracic back: No tenderness.     Lumbar back: Normal.     Right lower leg: Edema present.     Left lower leg: Edema present.     Comments: Small abrasion noted on the prominent thoracic spine, no tenderness  Skin:    General: Skin is warm and dry.     Findings: No rash.  Neurological:     General: No focal deficit present.     Mental Status: He is alert.      Cranial Nerves: No cranial nerve deficit, dysarthria or facial asymmetry.     Sensory: No sensory deficit.     Motor: Weakness present. No abnormal muscle tone or seizure activity.     Coordination: Coordination normal.     Comments: Patient able to lift both arms and legs off the bed, generalized weakness, patient states at  baseline he uses a wheelchair and walker  Psychiatric:        Mood and Affect: Mood normal.     ED Results / Procedures / Treatments   Labs (all labs ordered are listed, but only abnormal results are displayed) Labs Reviewed - No data to display  EKG None  Radiology No results found.  Procedures Procedures  {Document cardiac monitor, telemetry assessment procedure when appropriate:1}  Medications Ordered in ED Medications  sodium chloride 0.9 % bolus 500 mL (has no administration in time range)    And  0.9 %  sodium chloride infusion (has no administration in time range)    ED Course/ Medical Decision Making/ A&P   {   Click here for ABCD2, HEART and other calculatorsREFRESH Note before signing :1}                          Medical Decision Making Amount and/or Complexity of Data Reviewed Labs: ordered. Radiology: ordered. ECG/medicine tests: ordered.  Risk Prescription drug management.   ***  {Document critical care time when appropriate:1} {Document review of labs and clinical decision tools ie heart score, Chads2Vasc2 etc:1}  {Document your independent review of radiology images, and any outside records:1} {Document your discussion with family members, caretakers, and with consultants:1} {Document social determinants of health affecting pt's care:1} {Document your decision making why or why not admission, treatments were needed:1} Final Clinical Impression(s) / ED Diagnoses Final diagnoses:  None    Rx / DC Orders ED Discharge Orders     None

## 2022-11-25 NOTE — H&P (Signed)
History and Physical    Patient: Russell Ayers ZOX:[id number] DOB: 1933-05-14 DOA: 11/25/2022 DOS: the patient was seen and examined on 11/25/2022 PCP: Patient, No Pcp Per  Patient coming from: SNF [facility]  Chief Complaint:  Chief Complaint  Patient presents with   Fall    Pt coming from SNF post syncope/fall. Pt is a&ox4, pale, warm and dry. Pt denies any pain.    HPI: Russell Ayers is a 89 y.o. male with medical history significant of hypertension, BPH, s/p chronic foley catheter placement, anemia , generalized anxiety disorder, insomnia, type 2 DM, non insulin dependent, stroke , seizures, was brought in from long term SNF for syncope. Patient is a poor historian, but able to answer simple questions. Daughter at bedside was able to give some history too. She reports that patient had a fall a week ago, unclear if he lost consciousness at that time.  He denies having  fevers or chills, nausea, vomiting, abdominal pain, diarrhea, hematochezia or dysuria. He denies having chest pain or sob, headache or dizziness or blurry vision.  Family at bedside reports that patient's foley is usually replaced monthly.  He usually ambulates using a walker/ wheelchair.    ED work up  Pt on arrival is afebrile, hypotensive 96/47 mm hg, tachycardic around 96/min, with sats around 92%.  Labs work shows wbc count of 19,000, hemoglobin of 7.9 , platelets of 456,000. bicarb of 19, sodium of 133, troponin 35 and 140. Lactate of 4.3 . UA shows cloudy urine with large leukocytes.  Urine culture are sent.  CT head with out contrast No acute intracranial pathology. Small-vessel white matter disease.  CXR no active disease.  EKG  shows sinus rhythm without any ischemic changes.   He was referred to [facility] for admission for sepsis.     Review of Systems: As mentioned in the history of present illness. All other systems reviewed and are negative. Past Medical History:  Diagnosis Date   Anemia    Arthritis     Constipation 07/16/2022   Generalized anxiety disorder    Glaucoma    History of kidney stones    Hypertension    Inguinal hernia    Insomnia    Memory impairment 07/16/2022   Pneumonia    July 2023   Primary insomnia 07/16/2022   Prostatism    S/p left hip fracture 02/04/2021   SBO (small bowel obstruction) (HCC) 07/26/2020   Seizure (HCC)    Stroke (HCC)    Type 2 diabetes mellitus with other specified complication (HCC) 07/16/2022   Past Surgical History:  Procedure Laterality Date   CATARACT EXTRACTION, BILATERAL     FEMUR IM NAIL Left 02/05/2021   Procedure: INTRAMEDULLARY (IM) NAIL FEMORAL;  Surgeon: Sheral Apley, MD;  Location: [facility];  Service: Orthopedics;  Laterality: Left;   glaucoma     HIP SURGERY Left    INGUINAL HERNIA REPAIR Left 02/08/2022   Procedure: LEFT HERNIA REPAIR INGUINAL ADULT;  Surgeon: Berna Bue, MD;  Location: [facility];  Service: General;  Laterality: Left;   Social History:  reports that he quit smoking about 33 years ago. His smoking use included cigarettes. He has never used smokeless tobacco. He reports that he does not drink alcohol and does not use drugs.  No Known Allergies  Family History  Problem Relation Age of Onset   Cancer Brother    Heart attack Brother     Prior to Admission medications   Medication Sig  Start Date End Date Taking? Authorizing Provider  acetaminophen (TYLENOL) 500 MG tablet Take 2 tablets (1,000 mg total) by mouth every 8 (eight) hours as needed. Patient taking differently: Take 1,000 mg by mouth 3 (three) times daily. 07/18/22  Yes Burnadette Pop, MD  Cholecalciferol (VITAMIN D3) 50 MCG (2000 UT) TABS Take 2,000 Units by mouth daily.   Yes [provider]  diclofenac Sodium (VOLTAREN) 1 % GEL Apply 2 g topically See admin instructions. Apply 2 grams twice daily to left shoulder, both knees, right foot and toes, left hip.   Yes [provider]  docusate sodium (COLACE) 100 MG capsule  Take 100 mg by mouth 2 (two) times daily.   Yes [provider]  escitalopram (LEXAPRO) 20 MG tablet Take 20 mg by mouth daily. 09/26/21  Yes [provider]  ferrous sulfate 325 (65 FE) MG tablet Take 325 mg by mouth daily.   Yes [provider]  finasteride (PROSCAR) 5 MG tablet Take 5 mg by mouth daily. 09/30/18  Yes [provider]  furosemide (LASIX) 40 MG tablet Take 40 mg by mouth See admin instructions. 40 mg twice daily from 11/20/22-11/25/22, then resume 40 mg once daily on 11/26/22.   Yes [provider]  guaiFENesin (MUCINEX) 600 MG 12 hr tablet Take 600 mg by mouth 2 (two) times daily as needed for cough or to loosen phlegm.   Yes [provider]  latanoprost (XALATAN) 0.005 % ophthalmic solution Place 1 drop into both eyes every evening. 11/20/18  Yes [provider]  lithium carbonate 150 MG capsule Take 150 mg by mouth at bedtime.   Yes [provider]  LORazepam (ATIVAN) 1 MG tablet Take 1 mg by mouth 2 (two) times daily. 09/24/21  Yes [provider]  metFORMIN (GLUCOPHAGE) 500 MG tablet Take 500 mg by mouth 2 (two) times daily. 09/28/21  Yes [provider]  mirabegron ER (MYRBETRIQ) 25 MG TB24 tablet Take 25 mg by mouth daily.   Yes [provider]  nystatin powder Apply 1 Application topically 3 (three) times daily as needed (yeast to groin).   Yes [provider]  ondansetron (ZOFRAN) 4 MG tablet Take 4 mg by mouth daily as needed for nausea or vomiting.   Yes [provider]  OVER THE COUNTER MEDICATION Take 1,000 mg by mouth 2 (two) times daily. D-Mannose + Cranberry + Probiotics 1000mg  capsules   Yes [provider]  pantoprazole (PROTONIX) 40 MG tablet Take 1 tablet (40 mg total) by mouth daily. 07/19/22  Yes Burnadette Pop, MD  polyethylene glycol (MIRALAX / GLYCOLAX) 17 g packet Take 17 g by mouth daily as needed for mild constipation. 02/08/21  Yes Adhikari,  Willia Craze, MD  Sennosides (SENNA) 8.6 MG CAPS Take 8.6 mg by mouth daily.   Yes [provider]  sodium chloride (SALINE MIST) 0.65 % nasal spray Place 1 spray into the nose 2 (two) times daily.   Yes [provider]  tamsulosin (FLOMAX) 0.4 MG CAPS capsule Take 0.4 mg by mouth every evening. 11/25/20  Yes [provider]  temazepam (RESTORIL) 15 MG capsule Take 15 mg by mouth at bedtime. 09/19/21  Yes [provider]  timolol (TIMOPTIC) 0.5 % ophthalmic solution Place 1 drop into both eyes every evening. 08/27/21  Yes [provider]  traZODone (DESYREL) 100 MG tablet Take 100 mg by mouth at bedtime.   Yes [provider]  vitamin B-12 (CYANOCOBALAMIN) 100 MCG tablet Take 100  mcg by mouth daily.   Yes [provider]  zinc oxide 20 % ointment Apply 1 application. topically See admin instructions. Apply topically to buttocks every shift   Yes [provider]  ciprofloxacin (CIPRO) 500 MG tablet Take 500 mg by mouth 2 (two) times daily. Patient not taking: Reported on 11/25/2022    [provider]  doxycycline (ADOXA) 100 MG tablet Take 100 mg by mouth 2 (two) times daily. Patient not taking: Reported on 11/25/2022    [provider]    Physical Exam: Vitals:   11/25/22 1417 11/25/22 1419 11/25/22 1421 11/25/22 1645  BP:  (!) 101/56  (!) 96/47  Pulse:  96  76  Resp:  19  19  Temp:  97.8 F (36.6 C)    TempSrc:  Oral    SpO2: 98% 95%  100%  Weight:   63.5 kg   Height:   5\' 11"  (1.803 m)    General exam: Elderly frail gentleman, not in distress on RA.  Respiratory system: Clear to auscultation. Respiratory effort normal. Cardiovascular system: S1 & S2 heard, RRR. No JVD, LEG EDEMA present.  Gastrointestinal system: Abdomen is nondistended, soft and nontender.  Central nervous system: Alert and oriented to person and place only. Some cognitive deficits, .  Extremities: bilateral leg edema.  Skin: No rashes,   Psychiatry: mood is appropriate.   Data Reviewed: Results for orders placed or performed during the hospital encounter of 11/25/22 (from the past 24 hour(s))  Basic metabolic panel     Status: Abnormal   Collection Time: 11/25/22  2:43 PM  Result Value Ref Range   Sodium 133 (L) 135 - 145 mmol/L   Potassium 3.9 3.5 - 5.1 mmol/L   Chloride 97 (L) 98 - 111 mmol/L   CO2 19 (L) 22 - 32 mmol/L   Glucose, Bld 149 (H) 70 - 99 mg/dL   BUN 11 8 - 23 mg/dL   Creatinine, Ser 8.29 0.61 - 1.24 mg/dL   Calcium 8.8 (L) 8.9 - 10.3 mg/dL   GFR, Estimated >56 >21 mL/min   Anion gap 17 (H) 5 - 15  CBC WITH DIFFERENTIAL     Status: Abnormal   Collection Time: 11/25/22  2:43 PM  Result Value Ref Range   WBC 19.1 (H) 4.0 - 10.5 K/uL   RBC 2.93 (L) 4.22 - 5.81 MIL/uL   Hemoglobin 7.9 (L) 13.0 - 17.0 g/dL   HCT 30.8 (L) 65.7 - 84.6 %   MCV 89.1 80.0 - 100.0 fL   MCH 27.0 26.0 - 34.0 pg   MCHC 30.3 30.0 - 36.0 g/dL   RDW 96.2 95.2 - 84.1 %   Platelets 456 (H) 150 - 400 K/uL   nRBC 0.0 0.0 - 0.2 %   Neutrophils Relative % 90 %   Neutro Abs 17.4 (H) 1.7 - 7.7 K/uL   Lymphocytes Relative 4 %   Lymphs Abs 0.7 0.7 - 4.0 K/uL   Monocytes Relative 5 %   Monocytes Absolute 0.9 0.1 - 1.0 K/uL   Eosinophils Relative 0 %   Eosinophils Absolute 0.1 0.0 - 0.5 K/uL   Basophils Relative 0 %   Basophils Absolute 0.1 0.0 - 0.1 K/uL   Immature Granulocytes 1 %   Abs Immature Granulocytes 0.13 (H) 0.00 - 0.07 K/uL  Troponin I (High Sensitivity)     Status: Abnormal   Collection Time: 11/25/22  2:43 PM  Result Value Ref Range   Troponin I (High Sensitivity) 35 (H) <  18 ng/L  Urinalysis, Routine w reflex microscopic -Urine, Catheterized; Indwelling urinary catheter     Status: Abnormal   Collection Time: 11/25/22  2:44 PM  Result Value Ref Range   Color, Urine YELLOW YELLOW   APPearance CLOUDY (A) CLEAR   Specific Gravity, Urine 1.012 1.005 - 1.030   pH 6.0 5.0 - 8.0   Glucose, UA NEGATIVE NEGATIVE mg/dL    Hgb urine dipstick NEGATIVE NEGATIVE   Bilirubin Urine NEGATIVE NEGATIVE   Ketones, ur NEGATIVE NEGATIVE mg/dL   Protein, ur 30 (A) NEGATIVE mg/dL   Nitrite NEGATIVE NEGATIVE   Leukocytes,Ua LARGE (A) NEGATIVE   RBC / HPF >50 0 - 5 RBC/hpf   WBC, UA >50 0 - 5 WBC/hpf   Bacteria, UA RARE (A) NONE SEEN   Squamous Epithelial / HPF 0-5 0 - 5 /HPF   Mucus PRESENT    Budding Yeast PRESENT    Hyaline Casts, UA PRESENT   Lactic acid, plasma     Status: Abnormal   Collection Time: 11/25/22  2:46 PM  Result Value Ref Range   Lactic Acid, Venous 4.3 (HH) 0.5 - 1.9 mmol/L  Troponin I (High Sensitivity)     Status: Abnormal   Collection Time: 11/25/22  4:44 PM  Result Value Ref Range   Troponin I (High Sensitivity) 140 (HH) <18 ng/L  Lactic acid, plasma     Status: Abnormal   Collection Time: 11/25/22  5:37 PM  Result Value Ref Range   Lactic Acid, Venous 2.1 (HH) 0.5 - 1.9 mmol/L     Assessment and Plan:   Sepsis possibly from UTI.  On admission , he was hypotensive, tachycardic, with leukocytosis and elevated lactate and possible UTI.  Admit to progressive bed, . IV fluids with improvement in BP parameters. Keep MAP>65 .  Started on IV rocephin and follow up urine cultures.  Replace foley catheter .  Trend lactate and follow wbc count.    Elevated troponins/ NSTEMI possibly demand ischemia from sepsis. Trend troponins, EKG does not show ischemic changes. Patient denies any chest pain or sob. But he did have syncope twice in the last 2 weeks. Echocardiogram ordered.  Will start him on aspirin.  Probably not a candidate for IV heparin due to anemia . Will get lipid panel in am.  Curbside with cardiology fellow on call, recommended to follow up the echocardiogram and call cardiology in am if abnormal.  Discussed with daughter, suggested monitor overnight , and if echo abn, will involve cardiology.    Syncope of unclear etiology Twice in the last 2 weeks.  CT head without acute  pathology.  Monitor overnight on telemetry to check for arrhythmias.  Check MRI of the brain without contrast to rule out stroke.  Check orthostatics in am.  Therapy evaluations ordered   Anemia of unclear etiology.  Daughter reports that staff at SNF reports some blood in the stools in the past.  Will get stool for occult blood and anemia panel.  His baseline hemoglobin appears to be around 9 currently at 7.9, will probably drop further with IV fluids.  Check cbc in am.  Transfuse to keep hemoglobin greater than 7.  Daughter is not aware of any colonoscopy or endoscopies in the past.    Hypotension:  BP parameters improved with IV fluids.    BPH  Restarted meds.    Generalized anxiety disorder.  Resume ativan .    Type 2 DM;  Non insulin dependent.  Stop metformin for elevated  lactic acid.  Check A1c.  Continue with SSI.   PT/OT Evaluations ordered.   In view of his age, multiple co morbidities, sepsis and anemia and possible NSTEMI, palliative care consulted for goals of care.    Advance Care Planning:   Code Status: DNR   Consults: palliative .   Family Communication: discussed with daughter.   Severity of Illness: The appropriate patient status for this patient is INPATIENT. Inpatient status is judged to be reasonable and necessary in order to provide the required intensity of service to ensure the patient's safety. The patient's presenting symptoms, physical exam findings, and initial radiographic and laboratory data in the context of their chronic comorbidities is felt to place them at high risk for further clinical deterioration. Furthermore, it is not anticipated that the patient will be medically stable for discharge from the hospital within 2 midnights of admission.   * I certify that at the point of admission it is my clinical judgment that the patient will require inpatient hospital care spanning beyond 2 midnights from the point of admission due to high  intensity of service, high risk for further deterioration and high frequency of surveillance required.*  Author: Kathlen Mody, MD 11/25/2022 5:44 PM  For on call review [email or web address].

## 2022-11-25 NOTE — ED Notes (Signed)
ED TO INPATIENT HANDOFF REPORT  ED Nurse Name and Phone #:  Marisue Ivan [phone]  S Name/Age/Gender Russell Ayers 89 y.o. male Room/Bed: 035C/035C  Code Status   Code Status: DNR  Home/SNF/Other Nursing Home Patient oriented to: self, place, time, and situation Is this baseline? Yes   Triage Complete: Triage complete  Chief Complaint Sepsis (HCC) [A41.9]  Triage Note No notes on file   Allergies No Known Allergies  Level of Care/Admitting Diagnosis ED Disposition     ED Disposition  Admit   Condition  --   Comment  Hospital Area: [facility] [[id number]]  Level of Care: Progressive [102]  Admit to Progressive based on following criteria: MULTISYSTEM THREATS such as stable sepsis, metabolic/electrolyte imbalance with or without encephalopathy that is responding to early treatment.  May admit patient to [facility] or [name] if equivalent level of care is available:: Yes  Covid Evaluation: Asymptomatic - no recent exposure (last 10 days) testing not required  Diagnosis: Sepsis [facility]) [[id number]]  Admitting Physician: Kathlen Mody [[id number]]  Attending Physician: Kathlen Mody [id number]  Certification:: I certify this patient will need inpatient services for at least 2 midnights  Estimated Length of Stay: 3          B Medical/Surgery History Past Medical History:  Diagnosis Date   Anemia    Arthritis    Constipation 07/16/2022   Generalized anxiety disorder    Glaucoma    History of kidney stones    Hypertension    Inguinal hernia    Insomnia    Memory impairment 07/16/2022   Pneumonia    July 2023   Primary insomnia 07/16/2022   Prostatism    S/p left hip fracture 02/04/2021   SBO (small bowel obstruction) (HCC) 07/26/2020   Seizure (HCC)    Stroke (HCC)    Type 2 diabetes mellitus with other specified complication (HCC) 07/16/2022   Past Surgical History:  Procedure Laterality Date   CATARACT EXTRACTION, BILATERAL     FEMUR IM NAIL Left  02/05/2021   Procedure: INTRAMEDULLARY (IM) NAIL FEMORAL;  Surgeon: Sheral Apley, MD;  Location: [facility];  Service: Orthopedics;  Laterality: Left;   glaucoma     HIP SURGERY Left    INGUINAL HERNIA REPAIR Left 02/08/2022   Procedure: LEFT HERNIA REPAIR INGUINAL ADULT;  Surgeon: Berna Bue, MD;  Location: [facility];  Service: General;  Laterality: Left;     A IV Location/Drains/Wounds Patient Lines/Drains/Airways Status     Active Line/Drains/Airways     Name Placement date Placement time Site Days   Peripheral IV 11/25/22 18 G Anterior;Right Antecubital 11/25/22  --  Antecubital  less than 1   Urethral Catheter Shaw RN Coude 18 Fr. 09/30/22  1112  Coude  56            Intake/Output Last 24 hours No intake or output data in the 24 hours ending 11/25/22 1750  Labs/Imaging Results for orders placed or performed during the hospital encounter of 11/25/22 (from the past 48 hour(s))  Basic metabolic panel     Status: Abnormal   Collection Time: 11/25/22  2:43 PM  Result Value Ref Range   Sodium 133 (L) 135 - 145 mmol/L   Potassium 3.9 3.5 - 5.1 mmol/L   Chloride 97 (L) 98 - 111 mmol/L   CO2 19 (L) 22 - 32 mmol/L   Glucose, Bld 149 (H) 70 - 99 mg/dL    Comment: Glucose reference range applies only  to samples taken after fasting for at least 8 hours.   BUN 11 8 - 23 mg/dL   Creatinine, Ser 1.61 0.61 - 1.24 mg/dL   Calcium 8.8 (L) 8.9 - 10.3 mg/dL   GFR, Estimated >09 >60 mL/min    Comment: (NOTE) Calculated using the CKD-EPI Creatinine Equation (2021)    Anion gap 17 (H) 5 - 15    Comment: Performed at [facility], [address] [place], [place] [address]  CBC WITH DIFFERENTIAL     Status: Abnormal   Collection Time: 11/25/22  2:43 PM  Result Value Ref Range   WBC 19.1 (H) 4.0 - 10.5 K/uL   RBC 2.93 (L) 4.22 - 5.81 MIL/uL   Hemoglobin 7.9 (L) 13.0 - 17.0 g/dL   HCT 81.1 (L) 91.4 - 78.2 %   MCV 89.1 80.0 - 100.0 fL   MCH 27.0 26.0 - 34.0 pg   MCHC 30.3  30.0 - 36.0 g/dL   RDW 95.6 21.3 - 08.6 %   Platelets 456 (H) 150 - 400 K/uL   nRBC 0.0 0.0 - 0.2 %   Neutrophils Relative % 90 %   Neutro Abs 17.4 (H) 1.7 - 7.7 K/uL   Lymphocytes Relative 4 %   Lymphs Abs 0.7 0.7 - 4.0 K/uL   Monocytes Relative 5 %   Monocytes Absolute 0.9 0.1 - 1.0 K/uL   Eosinophils Relative 0 %   Eosinophils Absolute 0.1 0.0 - 0.5 K/uL   Basophils Relative 0 %   Basophils Absolute 0.1 0.0 - 0.1 K/uL   Immature Granulocytes 1 %   Abs Immature Granulocytes 0.13 (H) 0.00 - 0.07 K/uL    Comment: Performed at [facility], [address] [place], [place] [address]  Troponin I (High Sensitivity)     Status: Abnormal   Collection Time: 11/25/22  2:43 PM  Result Value Ref Range   Troponin I (High Sensitivity) 35 (H) <18 ng/L    Comment: (NOTE) Elevated high sensitivity troponin I (hsTnI) values and significant  changes across serial measurements may suggest ACS but many other  chronic and acute conditions are known to elevate hsTnI results.  Refer to the "Links" section for chest pain algorithms and additional  guidance. Performed at [facility], [address], [place], [place] [address]   Urinalysis, Routine w reflex microscopic -Urine, Catheterized; Indwelling urinary catheter     Status: Abnormal   Collection Time: 11/25/22  2:44 PM  Result Value Ref Range   Color, Urine YELLOW YELLOW   APPearance CLOUDY (A) CLEAR   Specific Gravity, Urine 1.012 1.005 - 1.030   pH 6.0 5.0 - 8.0   Glucose, UA NEGATIVE NEGATIVE mg/dL   Hgb urine dipstick NEGATIVE NEGATIVE   Bilirubin Urine NEGATIVE NEGATIVE   Ketones, ur NEGATIVE NEGATIVE mg/dL   Protein, ur 30 (A) NEGATIVE mg/dL   Nitrite NEGATIVE NEGATIVE   Leukocytes,Ua LARGE (A) NEGATIVE   RBC / HPF >50 0 - 5 RBC/hpf   WBC, UA >50 0 - 5 WBC/hpf   Bacteria, UA RARE (A) NONE SEEN   Squamous Epithelial / HPF 0-5 0 - 5 /HPF   Mucus PRESENT    Budding Yeast PRESENT    Hyaline Casts, UA PRESENT      Comment: Performed at [facility], [address] [place], [place] [address]  Lactic acid, plasma     Status: Abnormal   Collection Time: 11/25/22  2:46 PM  Result Value Ref  Range   Lactic Acid, Venous 4.3 (HH) 0.5 - 1.9 mmol/L    Comment: CRITICAL RESULT CALLED TO, READ BACK BY AND VERIFIED WITH E [facility] RN AT [phone] BY D LONG Performed at [facility], [address], [place], [place] [address]    DG Thoracic Spine 2 View  Result Date: 11/25/2022 CLINICAL DATA:  Syncope.  Back pain. EXAM: THORACIC SPINE 2 VIEWS COMPARISON:  Chest CT, 09/30/2022. FINDINGS: Mild compression deformity of T12, not well-defined, but stable compared to the prior CT. No acute fracture. No bone lesion. Mild dextroscoliosis, apex along the midthoracic spine. Mild loss of disc height with minor endplate spurring along the mid to lower thoracic spine. Skeletal structures are diffusely demineralized. Soft tissues are unremarkable. IMPRESSION: 1. No acute fracture or acute finding. Stable appearance from the prior chest CT scan from 09/30/2022. 2. Mild chronic compression fracture of T12. 3. Mild disc degenerative changes and dextroscoliosis. 4. Diffuse skeletal demineralization. Electronically Signed   By: Amie Portland M.D.   On: 11/25/2022 15:45   DG Lumbar Spine Complete  Result Date: 11/25/2022 CLINICAL DATA:  Syncope.  Back pain. EXAM: LUMBAR SPINE - COMPLETE 4+ VIEW COMPARISON:  CT abdomen and pelvis, 07/16/2022. FINDINGS: No fracture.  No bone lesion. Grade 1 retrolisthesis of L1 and L2.  No other spondylolisthesis. Moderate levoscoliosis, apex at L2. Moderate loss of disc height with mild endplate sclerosis and spurring at L1-L2. Remaining disc spaces are well preserved. Aortic atherosclerotic calcifications. Soft tissues otherwise unremarkable. Skeletal structures are diffusely demineralized. IMPRESSION: 1. No fracture or acute finding. 2. Degenerative changes and levoscoliosis as detailed, stable  from the prior CT scan. Electronically Signed   By: Amie Portland M.D.   On: 11/25/2022 15:43   DG Chest 1 View  Result Date: 11/25/2022 CLINICAL DATA:  Syncope.  Back pain. EXAM: CHEST  1 VIEW COMPARISON:  09/30/2022. FINDINGS: Cardiac silhouette is normal in size. No mediastinal or hilar masses. Lungs demonstrate interstitial prominence, stable. There are also stable linear opacities at the lung bases consistent with atelectasis and/or scarring. No evidence of pneumonia or pulmonary edema. No convincing pleural effusion and no pneumothorax. Skeletal structures are grossly intact. IMPRESSION: No acute cardiopulmonary disease. Electronically Signed   By: Amie Portland M.D.   On: 11/25/2022 15:41   CT Head Wo Contrast  Result Date: 11/25/2022 CLINICAL DATA:  Head and neck trauma EXAM: CT HEAD WITHOUT CONTRAST CT CERVICAL SPINE WITHOUT CONTRAST TECHNIQUE: Multidetector CT imaging of the head and cervical spine was performed following the standard protocol without intravenous contrast. Multiplanar CT image reconstructions of the cervical spine were also generated. RADIATION DOSE REDUCTION: This exam was performed according to the departmental dose-optimization program which includes automated exposure control, adjustment of the mA and/or kV according to patient size and/or use of iterative reconstruction technique. COMPARISON:  09/30/2022 FINDINGS: CT HEAD FINDINGS Brain: No evidence of acute infarction, hemorrhage, hydrocephalus, or mass lesion. Unchanged, chronic bilateral fluid attenuation hygromas (series 5, image 18). Periventricular and deep white matter hypodensity. Vascular: No hyperdense vessel or unexpected calcification. Skull: Normal. Negative for fracture or focal lesion. Sinuses/Orbits: No acute finding. Other: None. CT CERVICAL SPINE FINDINGS Alignment: Normal. Skull base and vertebrae: No acute fracture. No primary bone lesion or focal pathologic process. Soft tissues and spinal canal: No  prevertebral fluid or swelling. No visible canal hematoma. Disc levels: Mild multilevel disc space height loss and osteophytosis throughout the cervical spine. Upper chest: Negative. Other: None. IMPRESSION: 1. No acute  intracranial pathology. Small-vessel white matter disease. 2. Unchanged, chronic bilateral fluid attenuation hygromas. 3. No fracture or static subluxation of the cervical spine. 4. Mild multilevel disc degenerative disease throughout the cervical spine. Electronically Signed   By: Jearld Lesch M.D.   On: 11/25/2022 15:25   CT Cervical Spine Wo Contrast  Result Date: 11/25/2022 CLINICAL DATA:  Head and neck trauma EXAM: CT HEAD WITHOUT CONTRAST CT CERVICAL SPINE WITHOUT CONTRAST TECHNIQUE: Multidetector CT imaging of the head and cervical spine was performed following the standard protocol without intravenous contrast. Multiplanar CT image reconstructions of the cervical spine were also generated. RADIATION DOSE REDUCTION: This exam was performed according to the departmental dose-optimization program which includes automated exposure control, adjustment of the mA and/or kV according to patient size and/or use of iterative reconstruction technique. COMPARISON:  09/30/2022 FINDINGS: CT HEAD FINDINGS Brain: No evidence of acute infarction, hemorrhage, hydrocephalus, or mass lesion. Unchanged, chronic bilateral fluid attenuation hygromas (series 5, image 18). Periventricular and deep white matter hypodensity. Vascular: No hyperdense vessel or unexpected calcification. Skull: Normal. Negative for fracture or focal lesion. Sinuses/Orbits: No acute finding. Other: None. CT CERVICAL SPINE FINDINGS Alignment: Normal. Skull base and vertebrae: No acute fracture. No primary bone lesion or focal pathologic process. Soft tissues and spinal canal: No prevertebral fluid or swelling. No visible canal hematoma. Disc levels: Mild multilevel disc space height loss and osteophytosis throughout the cervical spine.  Upper chest: Negative. Other: None. IMPRESSION: 1. No acute intracranial pathology. Small-vessel white matter disease. 2. Unchanged, chronic bilateral fluid attenuation hygromas. 3. No fracture or static subluxation of the cervical spine. 4. Mild multilevel disc degenerative disease throughout the cervical spine. Electronically Signed   By: Jearld Lesch M.D.   On: 11/25/2022 15:25    Pending Labs Unresulted Labs (From admission, onward)     Start     Ordered   11/26/22 0500  CBC with Differential/Platelet  Tomorrow morning,   R        11/25/22 1738   11/26/22 0500  Comprehensive metabolic panel  Tomorrow morning,   R        11/25/22 1738   11/26/22 0500  Hemoglobin A1c  Tomorrow morning,   R       Comments: To assess prior glycemic control    11/25/22 1742   11/26/22 0500  Vitamin B12  (Anemia Panel (PNL))  Tomorrow morning,   R        11/25/22 1743   11/26/22 0500  Folate  (Anemia Panel (PNL))  Tomorrow morning,   R        11/25/22 1743   11/26/22 0500  Iron and TIBC  (Anemia Panel (PNL))  Tomorrow morning,   R        11/25/22 1743   11/26/22 0500  Ferritin  (Anemia Panel (PNL))  Tomorrow morning,   R        11/25/22 1743   11/26/22 0500  Reticulocytes  (Anemia Panel (PNL))  Tomorrow morning,   R        11/25/22 1743   11/25/22 1627  Urine Culture  Once,   URGENT       Question:  Indication  Answer:  Sepsis   11/25/22 1627   11/25/22 1446  Lactic acid, plasma  Now then every 2 hours,   R (with STAT occurrences)      11/25/22 1445   Signed and Held  Creatinine, serum  (enoxaparin (LOVENOX)    CrCl >/= 30 ml/min)  Weekly,  R     Comments: while on enoxaparin therapy    Signed and Held            Vitals/Pain Today's Vitals   11/25/22 1417 11/25/22 1419 11/25/22 1421 11/25/22 1645  BP:  (!) 101/56  (!) 96/47  Pulse:  96  76  Resp:  19  19  Temp:  97.8 F (36.6 C)    TempSrc:  Oral    SpO2: 98% 95%  100%  Weight:   63.5 kg   Height:   5\' 11"  (1.803 m)   PainSc:    0-No pain     Isolation Precautions No active isolations  Medications Medications  sodium chloride 0.9 % bolus 500 mL (0 mLs Intravenous Stopped 11/25/22 1608)    And  0.9 %  sodium chloride infusion ( Intravenous Restarted 11/25/22 1749)  cefTRIAXone (ROCEPHIN) 1 g in sodium chloride 0.9 % 100 mL IVPB (has no administration in time range)  insulin aspart (novoLOG) injection 0-6 Units (has no administration in time range)  cefTRIAXone (ROCEPHIN) 1 g in sodium chloride 0.9 % 100 mL IVPB (0 g Intravenous Stopped 11/25/22 1721)  sodium chloride 0.9 % bolus 1,000 mL (1,000 mLs Intravenous New Bag/Given 11/25/22 1653)    Mobility walks with device     Focused Assessments Cardiac Assessment Handoff:  Cardiac Rhythm: Normal sinus rhythm Lab Results  Component Value Date   CKTOTAL 623 (H) 07/29/2020   No results found for: "DDIMER" Does the Patient currently have chest pain? No   , Pulmonary Assessment Handoff:  Lung sounds:   O2 Device: Room Air      R Recommendations: See Admitting Provider Note  Report given to:   Additional Notes:

## 2022-11-26 DIAGNOSIS — N401 Enlarged prostate with lower urinary tract symptoms: Secondary | ICD-10-CM

## 2022-11-26 DIAGNOSIS — A419 Sepsis, unspecified organism: Secondary | ICD-10-CM | POA: Diagnosis not present

## 2022-11-26 DIAGNOSIS — Z66 Do not resuscitate: Secondary | ICD-10-CM

## 2022-11-26 DIAGNOSIS — Z7189 Other specified counseling: Secondary | ICD-10-CM

## 2022-11-26 DIAGNOSIS — Z515 Encounter for palliative care: Secondary | ICD-10-CM | POA: Diagnosis not present

## 2022-11-26 DIAGNOSIS — T83511A Infection and inflammatory reaction due to indwelling urethral catheter, initial encounter: Secondary | ICD-10-CM | POA: Diagnosis not present

## 2022-11-26 LAB — CBC WITH DIFFERENTIAL/PLATELET
Abs Immature Granulocytes: 0.1 10*3/uL — ABNORMAL HIGH (ref 0.00–0.07)
Basophils Absolute: 0.1 10*3/uL (ref 0.0–0.1)
Basophils Relative: 1 %
Eosinophils Absolute: 0.1 10*3/uL (ref 0.0–0.5)
Eosinophils Relative: 1 %
HCT: 22.8 % — ABNORMAL LOW (ref 39.0–52.0)
Hemoglobin: 7 g/dL — ABNORMAL LOW (ref 13.0–17.0)
Immature Granulocytes: 1 %
Lymphocytes Relative: 25 %
Lymphs Abs: 2.2 10*3/uL (ref 0.7–4.0)
MCH: 26.8 pg (ref 26.0–34.0)
MCHC: 30.7 g/dL (ref 30.0–36.0)
MCV: 87.4 fL (ref 80.0–100.0)
Monocytes Absolute: 0.5 10*3/uL (ref 0.1–1.0)
Monocytes Relative: 6 %
Neutro Abs: 5.9 10*3/uL (ref 1.7–7.7)
Neutrophils Relative %: 66 %
Platelets: 388 10*3/uL (ref 150–400)
RBC: 2.61 MIL/uL — ABNORMAL LOW (ref 4.22–5.81)
RDW: 14.1 % (ref 11.5–15.5)
WBC: 8.8 10*3/uL (ref 4.0–10.5)
nRBC: 0 % (ref 0.0–0.2)

## 2022-11-26 LAB — COMPREHENSIVE METABOLIC PANEL
ALT: 13 U/L (ref 0–44)
AST: 27 U/L (ref 15–41)
Albumin: 2.2 g/dL — ABNORMAL LOW (ref 3.5–5.0)
Alkaline Phosphatase: 100 U/L (ref 38–126)
Anion gap: 6 (ref 5–15)
BUN: 10 mg/dL (ref 8–23)
CO2: 26 mmol/L (ref 22–32)
Calcium: 8.2 mg/dL — ABNORMAL LOW (ref 8.9–10.3)
Chloride: 99 mmol/L (ref 98–111)
Creatinine, Ser: 1.01 mg/dL (ref 0.61–1.24)
GFR, Estimated: >60 mL/min (ref >60)
Glucose, Bld: 127 mg/dL — ABNORMAL HIGH (ref 70–99)
Potassium: 4.3 mmol/L (ref 3.5–5.1)
Sodium: 131 mmol/L — ABNORMAL LOW (ref 135–145)
Total Bilirubin: 0.3 mg/dL (ref 0.3–1.2)
Total Protein: 6 g/dL — ABNORMAL LOW (ref 6.5–8.1)

## 2022-11-26 LAB — FOLATE: Folate: 4 ng/mL — ABNORMAL LOW (ref >5.9)

## 2022-11-26 LAB — HEMOGLOBIN A1C
Hgb A1c MFr Bld: 6.1 % — ABNORMAL HIGH (ref 4.8–5.6)
Mean Plasma Glucose: 128.37 mg/dL

## 2022-11-26 LAB — HEMOGLOBIN AND HEMATOCRIT, BLOOD
HCT: 23.2 % — ABNORMAL LOW (ref 39.0–52.0)
Hemoglobin: 7.2 g/dL — ABNORMAL LOW (ref 13.0–17.0)

## 2022-11-26 LAB — IRON AND TIBC
Iron: 15 ug/dL — ABNORMAL LOW (ref 45–182)
Saturation Ratios: 8 % — ABNORMAL LOW (ref 17.9–39.5)
TIBC: 189 ug/dL — ABNORMAL LOW (ref 250–450)
UIBC: 174 ug/dL

## 2022-11-26 LAB — FERRITIN: Ferritin: 152 ng/mL (ref 24–336)

## 2022-11-26 LAB — URINE CULTURE

## 2022-11-26 LAB — LITHIUM LEVEL: Lithium Lvl: 0.35 mmol/L — ABNORMAL LOW (ref 0.60–1.20)

## 2022-11-26 LAB — GLUCOSE, CAPILLARY
Glucose-Capillary: 94 mg/dL (ref 70–99)
Glucose-Capillary: 140 mg/dL — ABNORMAL HIGH (ref 70–99)
Glucose-Capillary: 135 mg/dL — ABNORMAL HIGH (ref 70–99)
Glucose-Capillary: 109 mg/dL — ABNORMAL HIGH (ref 70–99)

## 2022-11-26 LAB — VITAMIN B12: Vitamin B-12: 429 pg/mL (ref 180–914)

## 2022-11-26 LAB — MRSA NEXT GEN BY PCR, NASAL: MRSA by PCR Next Gen: NOT DETECTED

## 2022-11-26 LAB — RETICULOCYTES
Immature Retic Fract: 17.9 % — ABNORMAL HIGH (ref 2.3–15.9)
RBC.: 2.6 MIL/uL — ABNORMAL LOW (ref 4.22–5.81)
Retic Count, Absolute: 50.2 10*3/uL (ref 19.0–186.0)
Retic Ct Pct: 1.9 % (ref 0.4–3.1)

## 2022-11-26 MED ORDER — POLYETHYLENE GLYCOL 3350 17 G PO PACK
17.0000 g | PACK | Freq: Every day | ORAL | Status: DC | PRN
  Administered 2022-11-26: 17 g via ORAL
  Filled 2022-11-26: qty 1

## 2022-11-26 MED ORDER — SODIUM CHLORIDE 0.9 % IV SOLN
250.0000 mg | Freq: Every day | INTRAVENOUS | Status: AC
  Administered 2022-11-26 – 2022-11-27 (×2): 250 mg via INTRAVENOUS
  Filled 2022-11-26 (×2): qty 20

## 2022-11-26 NOTE — Evaluation (Addendum)
Physical Therapy Evaluation Patient Details Name: Russell Ayers MRN: [id number] DOB: May 22, 1933 Today's Date: 11/26/2022  History of Present Illness  Pt is an 89 y.o. male who presented 11/25/22 s/p fall and concern for syncopal episode. MRI of brain negative for acute intracranial abnormality. Admitted with sepsis possibly from UTI along with NSTEMI. PMH: HTN, BPH, s/p chronic foley catheter placement, anemia, generalized anxiety disorder, insomnia, DM2, non insulin dependent, CVA, seizures    Clinical Impression  Pt presents with condition above and deficits mentioned below, see PT Problem List. Pt is disoriented to the month and a questionable historian but appears to be consistent in report of his PLOF to prior entries, in which pt was mobile mod I utilizing a RW vs manual w/c at his ALF. Currently, pt is demonstrating deficits in gross strength, balance, activity tolerance/endurance, and power. He required minA for bed mobility, transfers, and to take a few steps with a RW today, deferring further mobility due to fatigue. Pt reports he is having difficulty propelling his manual w/c due to his chronic L shoulder issues, thus as pt has poor endurance currently he could really benefit from an electric w/c. Provided the staff at the ALF can provide the level of care he currently needs, then recommend return to the ALF with HHPT follow-up. Will continue to follow acutely.   Orthostatics -  121/58 (77) & 67 bpm supine 115/73 (85) & 77 bpm sitting 116/56 (74) & 82 bpm standing 116/55 (73) & 85 bpm standing ~3 min  *pt reporting "I think some" lightheadedness when standing         Assistance Recommended at Discharge Frequent or constant Supervision/Assistance  If plan is discharge home, recommend the following:  Can travel by private vehicle  A little help with walking and/or transfers;A little help with bathing/dressing/bathroom;Assistance with cooking/housework;Direct supervision/assist for  financial management;Direct supervision/assist for medications management;Assist for transportation;Help with stairs or ramp for entrance        Equipment Recommendations Wheelchair (measurements PT) (electric (pt having difficulty propelling manual w/c due to L shoulder issues))  Recommendations for Other Services       Functional Status Assessment Patient has had a recent decline in their functional status and demonstrates the ability to make significant improvements in function in a reasonable and predictable amount of time.     Precautions / Restrictions Precautions Precautions: Fall Restrictions Weight Bearing Restrictions: No      Mobility  Bed Mobility Overal bed mobility: Needs Assistance Bed Mobility: Supine to Sit     Supine to sit: Min assist, HOB elevated     General bed mobility comments: Pt initiating bringing legs off EOB and ascending trunk using bed rails but needed minA to scoot hips to get to edge.    Transfers Overall transfer level: Needs assistance Equipment used: Rolling walker (2 wheels) Transfers: Sit to/from Stand, Bed to chair/wheelchair/BSC Sit to Stand: Min assist, From elevated surface   Step pivot transfers: Min assist       General transfer comment: Once pt was scooted to the edge, he needed minA to transition weight anteriorly and gain balance when coming to stand from elevated EOB. MinA for balance and to turn the RW to step pivot to R to recliner.    Ambulation/Gait Ambulation/Gait assistance: Min assist Gait Distance (Feet): 2 Feet Assistive device: Rolling walker (2 wheels) Gait Pattern/deviations: Decreased step length - right, Decreased step length - left, Decreased stride length, Trunk flexed Gait velocity: reduced Gait velocity interpretation: <1.31  ft/sec, indicative of household ambulator   General Gait Details: Pt takes slow, small steps to transfer from bed to recliner and reports fatigue, needing to sit to rest. Pt  reporting he is not sure he can try to walk any further today. MinA for balance  Stairs            Wheelchair Mobility     Tilt Bed    Modified Rankin (Stroke Patients Only) Modified Rankin (Stroke Patients Only) Pre-Morbid Rankin Score: Moderate disability Modified Rankin: Moderately severe disability     Balance Overall balance assessment: Needs assistance Sitting-balance support: No upper extremity supported, Feet supported Sitting balance-Leahy Scale: Fair Sitting balance - Comments: Static sitting with supervision for safety   Standing balance support: Bilateral upper extremity supported, During functional activity, Reliant on assistive device for balance Standing balance-Leahy Scale: Poor Standing balance comment: Reliant on RW and minA                             Pertinent Vitals/Pain Pain Assessment Pain Assessment: Faces Faces Pain Scale: Hurts little more Pain Location: L superior abdomen/inferior chest (only a few minutes in duration); L shoulder chronic Pain Descriptors / Indicators: Discomfort, Grimacing, Guarding Pain Intervention(s): Limited activity within patient's tolerance, Monitored during session, Repositioned, Other (comment) (notified RN)    Home Living Family/patient expects to be discharged to:: Assisted living                 Home Equipment: Rolling Walker (2 wheels);Wheelchair - manual      Prior Function Prior Level of Function : History of Falls (last six months);Patient poor historian/Family not available             Mobility Comments: uses a RW vs manual w/c for mobility mod I, per pt; hx of falls; reports increased difficulty managing his manual w/c due to L shoulder issues ADLs Comments: assistance for ADL's     Hand Dominance        Extremity/Trunk Assessment   Upper Extremity Assessment Upper Extremity Assessment: Defer to OT evaluation    Lower Extremity Assessment Lower Extremity Assessment:  Generalized weakness    Cervical / Trunk Assessment Cervical / Trunk Assessment: Kyphotic  Communication   Communication: No difficulties  Cognition Arousal/Alertness: Awake/alert Behavior During Therapy: WFL for tasks assessed/performed Overall Cognitive Status: No family/caregiver present to determine baseline cognitive functioning                                 General Comments: Oriented to year but stated the month was "September" and initially stated he is in "[place]" then quickly corrected himself to "[facility] hospital". Pt aware he is here due to a fall. Needs extra time to process simple cues. Unsure if this is baseline or not.        General Comments General comments (skin integrity, edema, etc.): Orthostatics - 121/58 (77) & 67 bpm supine, 115/73 (85) & 77 bpm sitting, 116/56 (74) & 82 bpm standing, 116/55 (73) & 85 bpm standing ~3 min; pt reporting "I think some" lightheadedness when standing    Exercises Other Exercises Other Exercises: Provided IS, x>3 reps sitting in recliner end of session   Assessment/Plan    PT Assessment Patient needs continued PT services  PT Problem List Decreased strength;Decreased activity tolerance;Decreased balance;Decreased range of motion;Decreased mobility;Decreased cognition       PT Treatment  Interventions Gait training;DME instruction;Functional mobility training;Therapeutic activities;Therapeutic exercise;Balance training;Neuromuscular re-education;Cognitive remediation;Patient/family education;Wheelchair mobility training    PT Goals (Current goals can be found in the Care Plan section)  Acute Rehab PT Goals Patient Stated Goal: to regain strength PT Goal Formulation: With patient Time For Goal Achievement: 12/10/22 Potential to Achieve Goals: Good    Frequency Min 1X/week     Co-evaluation               AM-PAC PT "6 Clicks" Mobility  Outcome Measure Help needed turning from your back to your  side while in a flat bed without using bedrails?: A Little Help needed moving from lying on your back to sitting on the side of a flat bed without using bedrails?: A Little Help needed moving to and from a bed to a chair (including a wheelchair)?: A Little Help needed standing up from a chair using your arms (e.g., wheelchair or bedside chair)?: A Little Help needed to walk in hospital room?: Total Help needed climbing 3-5 steps with a railing? : Total 6 Click Score: 14    End of Session Equipment Utilized During Treatment: Gait belt Activity Tolerance: Patient tolerated treatment well Patient left: in chair;with call bell/phone within reach;with chair alarm set Nurse Communication: Mobility status;Other (comment) (vitals) PT Visit Diagnosis: Unsteadiness on feet (R26.81);Other abnormalities of gait and mobility (R26.89);Muscle weakness (generalized) (M62.81);History of falling (Z91.81);Repeated falls (R29.6);Difficulty in walking, not elsewhere classified (R26.2)    Time: 1610-9604 PT Time Calculation (min) (ACUTE ONLY): 24 min   Charges:   PT Evaluation $PT Eval Moderate Complexity: 1 Mod PT Treatments $Therapeutic Activity: 8-22 mins PT General Charges $$ ACUTE PT VISIT: 1 Visit         Raymond Gurney, PT, DPT Acute Rehabilitation Services  Office: [phone]   Jewel Baize 11/26/2022, 3:54 PM

## 2022-11-26 NOTE — Progress Notes (Signed)
PROGRESS NOTE    Russell Ayers  ZOX:[id number] DOB: 08/11/1932 DOA: 11/25/2022 PCP: Patient, No Pcp Per   Brief Narrative:  Russell Ayers is a 89 y.o. male with medical history significant of hypertension, BPH, s/p chronic foley catheter placement, anemia , generalized anxiety disorder, insomnia, type 2 DM, non insulin dependent, stroke , seizures, was brought in from long term SNF for syncope.  He was admitted for sepsis, present on admission secondary to UTI in the setting of chronic Foley catheter.  He is also noted to have some elevated troponins likely in the setting of demand ischemia from sepsis as well as worsening anemia of unclear etiology.  Assessment & Plan:   Principal Problem:   Sepsis (HCC) Active Problems:   Essential hypertension   BPH (benign prostatic hyperplasia)   Recurrent falls   Type 2 diabetes mellitus with other specified complication (HCC)   UTI (urinary tract infection) due to urinary indwelling catheter (HCC)   Syncope and collapse  Assessment and Plan:   Sepsis possibly from UTI.  On admission , he was hypotensive, tachycardic, with leukocytosis and elevated lactate and possible UTI.  Admit to progressive bed, . IV fluids with improvement in BP parameters. Keep MAP>65 .  Started on IV rocephin and follow up urine cultures.  Replace foley catheter if needed based on last days of exchange Trend lactate and follow wbc count.      Elevated troponins/ NSTEMI possibly demand ischemia from sepsis. Trend troponins, EKG does not show ischemic changes. Patient denies any chest pain or sob. But he did have syncope twice in the last 2 weeks. Echocardiogram ordered and pending and will discuss with cardiology if abnormal Currently without any chest pain Will start him on aspirin.  Probably not a candidate for IV heparin due to anemia . Will get lipid panel in am.    Syncope of unclear etiology Twice in the last 2 weeks.  CT head without acute pathology.   Monitor overnight on telemetry to check for arrhythmias.  MRI brain with no acute findings Check orthostatics in am.  Therapy evaluations ordered and pending     Anemia of unclear etiology with noted iron deficiency Daughter reports that staff at SNF reports some blood in the stools in the past.  Will get stool for occult blood, still pending Order IV iron His baseline hemoglobin appears to be around 9 currently at 7.9, will probably drop further with IV fluids.  Check cbc in am.  Transfuse to keep hemoglobin greater than 7.  Daughter is not aware of any colonoscopy or endoscopies in the past.    Hypotension:  BP parameters improved with IV fluids.      BPH  Restarted meds.      Generalized anxiety disorder.  Resume ativan .      Type 2 DM;  Non insulin dependent.  Stop metformin for elevated lactic acid.  A1c 6.1% Continue with SSI.    PT/OT Evaluations ordered.    In view of his age, multiple co morbidities, sepsis and anemia and possible NSTEMI, palliative care consulted for goals of care.    DVT prophylaxis:Lovenox Code Status: DNR Family Communication: Discussed with daughter on phone 7/7 Disposition Plan:  Status is: Inpatient Remains inpatient appropriate because: Need for IV medications   Consultants:  Palliative  Procedures:  None  Antimicrobials:  Anti-infectives (From admission, onward)    Start     Dose/Rate Route Frequency Ordered Stop   11/26/22 1200  cefTRIAXone (ROCEPHIN)  1 g in sodium chloride 0.9 % 100 mL IVPB        1 g 200 mL/hr over 30 Minutes Intravenous Every 24 hours 11/25/22 1741     11/25/22 1630  cefTRIAXone (ROCEPHIN) 1 g in sodium chloride 0.9 % 100 mL IVPB        1 g 200 mL/hr over 30 Minutes Intravenous  Once 11/25/22 1627 11/25/22 1721       Subjective: Patient seen and evaluated today with no new acute complaints or concerns. No acute concerns or events noted overnight.  Objective: Vitals:   11/26/22 0000  11/26/22 0400 11/26/22 0600 11/26/22 0843  BP: (!) 106/49 (!) 114/48 (!) 120/56 (!) 114/53  Pulse: 62 67  66  Resp: 17 20  17   Temp: 97.7 F (36.5 C) 97.6 F (36.4 C)  97.9 F (36.6 C)  TempSrc: Oral   Oral  SpO2: 93% 93%  96%  Weight:      Height:        Intake/Output Summary (Last 24 hours) at 11/26/2022 0953 Last data filed at 11/26/2022 0844 Gross per 24 hour  Intake 1424.85 ml  Output 1050 ml  Net 374.85 ml   Filed Weights   11/25/22 1421  Weight: 63.5 kg    Examination:  General exam: Appears calm and comfortable  Respiratory system: Clear to auscultation. Respiratory effort normal. Cardiovascular system: S1 & S2 heard, RRR.  Gastrointestinal system: Abdomen is soft Central nervous system: Alert and awake Extremities: No edema Skin: No significant lesions noted Psychiatry: Flat affect. Foley catheter with clear, yellow urine output    Data Reviewed: I have personally reviewed following labs and imaging studies  CBC: Recent Labs  Lab 11/25/22 1443 11/26/22 0021 11/26/22 0704  WBC 19.1* 8.8  --   NEUTROABS 17.4* 5.9  --   HGB 7.9* 7.0* 7.2*  HCT 26.1* 22.8* 23.2*  MCV 89.1 87.4  --   PLT 456* 388  --    Basic Metabolic Panel: Recent Labs  Lab 11/25/22 1443 11/26/22 0021  NA 133* 131*  K 3.9 4.3  CL 97* 99  CO2 19* 26  GLUCOSE 149* 127*  BUN 11 10  CREATININE 1.15 1.01  CALCIUM 8.8* 8.2*   GFR: Estimated Creatinine Clearance: 45.4 mL/min (by C-G formula based on SCr of 1.01 mg/dL). Liver Function Tests: Recent Labs  Lab 11/26/22 0021  AST 27  ALT 13  ALKPHOS 100  BILITOT 0.3  PROT 6.0*  ALBUMIN 2.2*   No results for input(s): "LIPASE", "AMYLASE" in the last 168 hours. No results for input(s): "AMMONIA" in the last 168 hours. Coagulation Profile: No results for input(s): "INR", "PROTIME" in the last 168 hours. Cardiac Enzymes: No results for input(s): "CKTOTAL", "CKMB", "CKMBINDEX", "TROPONINI" in the last 168 hours. BNP (last 3  results) No results for input(s): "PROBNP" in the last 8760 hours. HbA1C: Recent Labs    11/26/22 0021  HGBA1C 6.1*   CBG: Recent Labs  Lab 11/25/22 2146 11/26/22 0614  GLUCAP 132* 94   Lipid Profile: No results for input(s): "CHOL", "HDL", "LDLCALC", "TRIG", "CHOLHDL", "LDLDIRECT" in the last 72 hours. Thyroid Function Tests: No results for input(s): "TSH", "T4TOTAL", "FREET4", "T3FREE", "THYROIDAB" in the last 72 hours. Anemia Panel: Recent Labs    11/26/22 0021  VITAMINB12 429  FOLATE 4.0*  FERRITIN 152  TIBC 189*  IRON 15*  RETICCTPCT 1.9   Sepsis Labs: Recent Labs  Lab 11/25/22 1446 11/25/22 1737  LATICACIDVEN 4.3* 2.1*  Recent Results (from the past 240 hour(s))  MRSA Next Gen by PCR, Nasal     Status: None   Collection Time: 11/26/22  5:35 AM   Specimen: Nasal Mucosa; Nasal Swab  Result Value Ref Range Status   MRSA by PCR Next Gen NOT DETECTED NOT DETECTED Final    Comment: (NOTE) The GeneXpert MRSA Assay (FDA approved for NASAL specimens only), is one component of a comprehensive MRSA colonization surveillance program. It is not intended to diagnose MRSA infection nor to guide or monitor treatment for MRSA infections. Test performance is not FDA approved in patients less than 56 years old. Performed at [facility], [address], [place], [place] [address]          Radiology Studies: MR BRAIN WO CONTRAST  Result Date: 11/25/2022 CLINICAL DATA:  Syncope EXAM: MRI HEAD WITHOUT CONTRAST TECHNIQUE: Multiplanar, multiecho pulse sequences of the brain and surrounding structures were obtained without intravenous contrast. COMPARISON:  07/28/2020 FINDINGS: Brain: No acute infarct, mass effect or extra-axial collection. No acute or chronic hemorrhage. There is multifocal hyperintense T2-weighted signal within the white matter. Generalized volume loss. The midline structures are normal. Vascular: Major flow voids are preserved. Skull and upper  cervical spine: Normal calvarium and skull base. Visualized upper cervical spine and soft tissues are normal. Sinuses/Orbits:No paranasal sinus fluid levels or advanced mucosal thickening. No mastoid or middle ear effusion. Normal orbits. IMPRESSION: 1. No acute intracranial abnormality. 2. Findings of chronic small vessel ischemia and volume loss. Electronically Signed   By: Deatra Robinson M.D.   On: 11/25/2022 23:16   DG Thoracic Spine 2 View  Result Date: 11/25/2022 CLINICAL DATA:  Syncope.  Back pain. EXAM: THORACIC SPINE 2 VIEWS COMPARISON:  Chest CT, 09/30/2022. FINDINGS: Mild compression deformity of T12, not well-defined, but stable compared to the prior CT. No acute fracture. No bone lesion. Mild dextroscoliosis, apex along the midthoracic spine. Mild loss of disc height with minor endplate spurring along the mid to lower thoracic spine. Skeletal structures are diffusely demineralized. Soft tissues are unremarkable. IMPRESSION: 1. No acute fracture or acute finding. Stable appearance from the prior chest CT scan from 09/30/2022. 2. Mild chronic compression fracture of T12. 3. Mild disc degenerative changes and dextroscoliosis. 4. Diffuse skeletal demineralization. Electronically Signed   By: Amie Portland M.D.   On: 11/25/2022 15:45   DG Lumbar Spine Complete  Result Date: 11/25/2022 CLINICAL DATA:  Syncope.  Back pain. EXAM: LUMBAR SPINE - COMPLETE 4+ VIEW COMPARISON:  CT abdomen and pelvis, 07/16/2022. FINDINGS: No fracture.  No bone lesion. Grade 1 retrolisthesis of L1 and L2.  No other spondylolisthesis. Moderate levoscoliosis, apex at L2. Moderate loss of disc height with mild endplate sclerosis and spurring at L1-L2. Remaining disc spaces are well preserved. Aortic atherosclerotic calcifications. Soft tissues otherwise unremarkable. Skeletal structures are diffusely demineralized. IMPRESSION: 1. No fracture or acute finding. 2. Degenerative changes and levoscoliosis as detailed, stable from the  prior CT scan. Electronically Signed   By: Amie Portland M.D.   On: 11/25/2022 15:43   DG Chest 1 View  Result Date: 11/25/2022 CLINICAL DATA:  Syncope.  Back pain. EXAM: CHEST  1 VIEW COMPARISON:  09/30/2022. FINDINGS: Cardiac silhouette is normal in size. No mediastinal or hilar masses. Lungs demonstrate interstitial prominence, stable. There are also stable linear opacities at the lung bases consistent with atelectasis and/or scarring. No evidence of pneumonia or pulmonary edema. No convincing pleural effusion and no pneumothorax. Skeletal structures are grossly  intact. IMPRESSION: No acute cardiopulmonary disease. Electronically Signed   By: Amie Portland M.D.   On: 11/25/2022 15:41   CT Head Wo Contrast  Result Date: 11/25/2022 CLINICAL DATA:  Head and neck trauma EXAM: CT HEAD WITHOUT CONTRAST CT CERVICAL SPINE WITHOUT CONTRAST TECHNIQUE: Multidetector CT imaging of the head and cervical spine was performed following the standard protocol without intravenous contrast. Multiplanar CT image reconstructions of the cervical spine were also generated. RADIATION DOSE REDUCTION: This exam was performed according to the departmental dose-optimization program which includes automated exposure control, adjustment of the mA and/or kV according to patient size and/or use of iterative reconstruction technique. COMPARISON:  09/30/2022 FINDINGS: CT HEAD FINDINGS Brain: No evidence of acute infarction, hemorrhage, hydrocephalus, or mass lesion. Unchanged, chronic bilateral fluid attenuation hygromas (series 5, image 18). Periventricular and deep white matter hypodensity. Vascular: No hyperdense vessel or unexpected calcification. Skull: Normal. Negative for fracture or focal lesion. Sinuses/Orbits: No acute finding. Other: None. CT CERVICAL SPINE FINDINGS Alignment: Normal. Skull base and vertebrae: No acute fracture. No primary bone lesion or focal pathologic process. Soft tissues and spinal canal: No prevertebral  fluid or swelling. No visible canal hematoma. Disc levels: Mild multilevel disc space height loss and osteophytosis throughout the cervical spine. Upper chest: Negative. Other: None. IMPRESSION: 1. No acute intracranial pathology. Small-vessel white matter disease. 2. Unchanged, chronic bilateral fluid attenuation hygromas. 3. No fracture or static subluxation of the cervical spine. 4. Mild multilevel disc degenerative disease throughout the cervical spine. Electronically Signed   By: Jearld Lesch M.D.   On: 11/25/2022 15:25   CT Cervical Spine Wo Contrast  Result Date: 11/25/2022 CLINICAL DATA:  Head and neck trauma EXAM: CT HEAD WITHOUT CONTRAST CT CERVICAL SPINE WITHOUT CONTRAST TECHNIQUE: Multidetector CT imaging of the head and cervical spine was performed following the standard protocol without intravenous contrast. Multiplanar CT image reconstructions of the cervical spine were also generated. RADIATION DOSE REDUCTION: This exam was performed according to the departmental dose-optimization program which includes automated exposure control, adjustment of the mA and/or kV according to patient size and/or use of iterative reconstruction technique. COMPARISON:  09/30/2022 FINDINGS: CT HEAD FINDINGS Brain: No evidence of acute infarction, hemorrhage, hydrocephalus, or mass lesion. Unchanged, chronic bilateral fluid attenuation hygromas (series 5, image 18). Periventricular and deep white matter hypodensity. Vascular: No hyperdense vessel or unexpected calcification. Skull: Normal. Negative for fracture or focal lesion. Sinuses/Orbits: No acute finding. Other: None. CT CERVICAL SPINE FINDINGS Alignment: Normal. Skull base and vertebrae: No acute fracture. No primary bone lesion or focal pathologic process. Soft tissues and spinal canal: No prevertebral fluid or swelling. No visible canal hematoma. Disc levels: Mild multilevel disc space height loss and osteophytosis throughout the cervical spine. Upper chest:  Negative. Other: None. IMPRESSION: 1. No acute intracranial pathology. Small-vessel white matter disease. 2. Unchanged, chronic bilateral fluid attenuation hygromas. 3. No fracture or static subluxation of the cervical spine. 4. Mild multilevel disc degenerative disease throughout the cervical spine. Electronically Signed   By: Jearld Lesch M.D.   On: 11/25/2022 15:25        Scheduled Meds:  aspirin  81 mg Oral Daily   Chlorhexidine Gluconate Cloth  6 each Topical Daily   diclofenac Sodium  2 g Topical BID   enoxaparin (LOVENOX) injection  40 mg Subcutaneous Q24H   escitalopram  20 mg Oral Daily   ferrous sulfate  325 mg Oral Daily   finasteride  5 mg Oral Daily   insulin aspart  0-6 Units Subcutaneous TID WC   latanoprost  1 drop Both Eyes QPM   lithium carbonate  150 mg Oral QHS   mirabegron ER  25 mg Oral Daily   pantoprazole  40 mg Oral Daily   tamsulosin  0.4 mg Oral QPM   temazepam  15 mg Oral QHS   timolol  1 drop Both Eyes QPM   traZODone  100 mg Oral QHS   vitamin B-12  100 mcg Oral Daily   Continuous Infusions:  sodium chloride 125 mL/hr at 11/26/22 0400   cefTRIAXone (ROCEPHIN)  IV     ferric gluconate (FERRLECIT) IVPB       LOS: 1 day    Time spent: 35 minutes    Alasha Mcguinness Hoover Brunette, DO [facility]  If 7PM-7AM, please contact night-coverage [email or web address] 11/26/2022, 9:53 AM

## 2022-11-26 NOTE — Consult Note (Signed)
Palliative Care Consult Note                                  Date: 11/26/2022   Patient Name: Russell Ayers  DOB: 12-Jun-1932  MRN: [id number]  Age / Sex: 89 y.o., male  PCP: Patient, No Pcp Per Referring Physician: Erick Blinks, DO  Reason for Consultation: Establishing goals of care  HPI/Patient Profile: 89 y.o. male  with past medical history of BPH with chronic foley catheter placement, anemia, type 2 diabetes, seizures, and HTN who presented to the ED on 11/25/2022 with syncope.He was admitted with sepsis secondary to UTI in the setting of chronic Foley catheter. He was noted to have elevated troponin, most likely in the setting of demand ischemia.  Palliative Medicine was consulted for goals of care.   Subjective:   I have reviewed medical records including progress notes, labs and imaging.   I met with patient and his daughter Russell Ayers at bedside to discuss diagnosis, prognosis, GOC, disposition, and options. Patient is alert, pleasant, and responsive to questions. He is not able to verbalize why he is in the hospital.   I introduced Palliative Medicine as specialized medical care for people living with serious illness. It focuses on providing relief from the symptoms and stress of a serious illness.   We discussed patient's current illness and what it means in the larger context of his/her ongoing co-morbidities. Current clinical status was reviewed.   Created space and opportunity for patient and family to express thoughts and feelings regarding current medical situation. Values and goals of care were attempted to be elicited.  Life Review: Patient is originally from [place]. 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 He is widowed. Russell Ayers is his only child.   Functional Status: Patient resides at [facility].  He is ambulatory with a walker around his apartment.  He needs assistance with  showering and dressing.  His meals are brought to his apartment, as it is difficult to walk the distance to the dining hall.    Patient/Family Understanding of Illness: Daughter has an excellent understanding of patient's acute and chronic medical issues.  GOC Discussion: Discussion was had about patient's diet and history of dysphagia. He is currently on a dysphagia 3 diet, but is unable to eat his lunch tray because it is difficult to chew. Reviewed previous SLP notes (last seen 07/18/22), and their recommendations were for dysphagia 2 diet with nectar thick liquids. Daughter is agreeable to dysphagia 2 diet but reports her father does not like the nectar thick liquids. She understands his risk for aspiration is increased with thin liquids, but speaks to the importance of his quality of life.   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  Provided education on the natural trajectory of chronic illness, emphasizing the progressive nature. Discussed that chronic illness results in decreased functional status over time, as patients do not usually return to previous baseline after a major illness. Discussed that patient will continue to have recurrent UTIs due to his chronic Foley catheter as well as recurrent pneumonia due to aspiration.  A discussion was had today regarding advanced directives. Concepts specific to code status, artifical feeding and hydration, continued IV antibiotics and rehospitalization was had. We also discussed different scopes of care -  full scope versus limited interventions (treat the treatable) versus comfort care. The MOST form was introduced and discussed.   For now, the goal is to continue to treat the treatable.  Daughter is hopeful for periods of medical stabilization in between hospitalizations, and that her father can have  optimal quality of life for as long as possible.  Questions and concerns addressed. Daughter has PMT contact info and was encouraged to call with any additional questions or concerns.    Review of Systems  All other systems reviewed and are negative.   Objective:   Primary Diagnoses: Present on Admission:  Sepsis (HCC)  Essential hypertension  BPH (benign prostatic hyperplasia)  Type 2 diabetes mellitus with other specified complication (HCC)  UTI (urinary tract infection) due to urinary indwelling catheter [facility])   Physical Exam Vitals reviewed.  Constitutional:      General: He is not in acute distress.    Comments: Chronically ill-appearing  Pulmonary:     Effort: Pulmonary effort is normal.  Neurological:     Mental Status: He is alert.     Comments: Oriented to person, place  Psychiatric:        Behavior: Behavior normal.    Vital Signs:  BP 109/61 (BP Location: Right Arm)   Pulse 75   Temp 97.6 F (36.4 C) (Oral)   Resp 19   Ht 5\' 11"  (1.803 m)   Wt 63.5 kg   SpO2 94%   BMI 19.53 kg/m   Palliative Assessment/Data: PPS 40-50%     Assessment & Plan:   SUMMARY OF RECOMMENDATIONS   Diet order changed to dysphagia 2 (with thin liquids per daughter request) Continue current limited interventions - treat the treatable Goal of care is for periods of medical stabilization in between hospitalizations, and that her father can have optimal quality of life for as long as possible  Primary Decision Maker: Patient needs support from his daughter for medical decisions  Code Status/Advance Care Planning: DNR with limited interventions  Prognosis:  Unable to determine  Discharge Planning:  Return to ALF   Thank you for allowing [place] to participate in the care of Russell Ayers   Time Total: 90 minutes  Greater than 50%  of this time was spent counseling and coordinating care related to the above assessment and plan.  Signed by: Sherlean Foot,  NP Palliative Medicine Team  Team Phone # [phone]  For individual providers, please see AMION

## 2022-11-26 NOTE — Plan of Care (Signed)
  Problem: Education: Goal: Ability to describe self-care measures that may prevent or decrease complications (Diabetes Survival Skills Education) will improve Outcome: Progressing Goal: Individualized Educational Video(s) Outcome: Progressing   Problem: Coping: Goal: Ability to adjust to condition or change in health will improve Outcome: Progressing   Problem: Fluid Volume: Goal: Ability to maintain a balanced intake and output will improve Outcome: Progressing   Problem: Health Behavior/Discharge Planning: Goal: Ability to identify and utilize available resources and services will improve Outcome: Progressing Goal: Ability to manage health-related needs will improve Outcome: Progressing   Problem: Metabolic: Goal: Ability to maintain appropriate glucose levels will improve Outcome: Progressing   Problem: Nutritional: Goal: Maintenance of adequate nutrition will improve Outcome: Progressing Goal: Progress toward achieving an optimal weight will improve Outcome: Progressing   Problem: Skin Integrity: Goal: Risk for impaired skin integrity will decrease Outcome: Progressing   Problem: Tissue Perfusion: Goal: Adequacy of tissue perfusion will improve Outcome: Progressing   Problem: Education: Goal: Knowledge of General Education information will improve Description: Including pain rating scale, medication(s)/side effects and non-pharmacologic comfort measures Outcome: Progressing   Problem: Health Behavior/Discharge Planning: Goal: Ability to manage health-related needs will improve Outcome: Progressing   Problem: Clinical Measurements: Goal: Ability to maintain clinical measurements within normal limits will improve Outcome: Progressing Goal: Will remain free from infection Outcome: Progressing Goal: Diagnostic test results will improve Outcome: Progressing Goal: Respiratory complications will improve Outcome: Progressing Goal: Cardiovascular complication will  be avoided Outcome: Progressing   Problem: Activity: Goal: Risk for activity intolerance will decrease Outcome: Progressing   Problem: Coping: Goal: Level of anxiety will decrease Outcome: Progressing   Problem: Elimination: Goal: Will not experience complications related to bowel motility Outcome: Progressing Goal: Will not experience complications related to urinary retention Outcome: Progressing   

## 2022-11-27 ENCOUNTER — Inpatient Hospital Stay (HOSPITAL_COMMUNITY): Payer: Medicare Other

## 2022-11-27 DIAGNOSIS — R55 Syncope and collapse: Secondary | ICD-10-CM

## 2022-11-27 DIAGNOSIS — A419 Sepsis, unspecified organism: Secondary | ICD-10-CM | POA: Diagnosis not present

## 2022-11-27 LAB — BASIC METABOLIC PANEL
Anion gap: 6 (ref 5–15)
BUN: 6 mg/dL — ABNORMAL LOW (ref 8–23)
CO2: 24 mmol/L (ref 22–32)
Calcium: 8.1 mg/dL — ABNORMAL LOW (ref 8.9–10.3)
Chloride: 103 mmol/L (ref 98–111)
Creatinine, Ser: 0.74 mg/dL (ref 0.61–1.24)
GFR, Estimated: >60 mL/min (ref >60)
Glucose, Bld: 115 mg/dL — ABNORMAL HIGH (ref 70–99)
Potassium: 3.8 mmol/L (ref 3.5–5.1)
Sodium: 133 mmol/L — ABNORMAL LOW (ref 135–145)

## 2022-11-27 LAB — MAGNESIUM: Magnesium: 1.8 mg/dL (ref 1.7–2.4)

## 2022-11-27 LAB — ECHOCARDIOGRAM COMPLETE
AR max vel: 2.43 cm2
AV Peak grad: 10.9 mmHg
Ao pk vel: 1.65 m/s
Area-P 1/2: 6.12 cm2
Est EF: 55
Height: 71 in
MV M vel: 2.64 m/s
MV Peak grad: 27.9 mmHg
S' Lateral: 3.5 cm
Weight: 2240 oz

## 2022-11-27 LAB — OCCULT BLOOD X 1 CARD TO LAB, STOOL: Fecal Occult Bld: NEGATIVE

## 2022-11-27 LAB — CBC
HCT: 24.1 % — ABNORMAL LOW (ref 39.0–52.0)
Hemoglobin: 7.5 g/dL — ABNORMAL LOW (ref 13.0–17.0)
MCH: 28 pg (ref 26.0–34.0)
MCHC: 31.1 g/dL (ref 30.0–36.0)
MCV: 89.9 fL (ref 80.0–100.0)
Platelets: 379 10*3/uL (ref 150–400)
RBC: 2.68 MIL/uL — ABNORMAL LOW (ref 4.22–5.81)
RDW: 14.1 % (ref 11.5–15.5)
WBC: 7.6 10*3/uL (ref 4.0–10.5)
nRBC: 0 % (ref 0.0–0.2)

## 2022-11-27 LAB — GLUCOSE, CAPILLARY
Glucose-Capillary: 105 mg/dL — ABNORMAL HIGH (ref 70–99)
Glucose-Capillary: 111 mg/dL — ABNORMAL HIGH (ref 70–99)
Glucose-Capillary: 186 mg/dL — ABNORMAL HIGH (ref 70–99)
Glucose-Capillary: 126 mg/dL — ABNORMAL HIGH (ref 70–99)

## 2022-11-27 LAB — URINE CULTURE: Culture: >=100000 — AB

## 2022-11-27 MED ORDER — FLUCONAZOLE 200 MG PO TABS
200.0000 mg | ORAL_TABLET | Freq: Every day | ORAL | Status: DC
  Administered 2022-11-27 – 2022-11-28 (×2): 200 mg via ORAL
  Filled 2022-11-27 (×2): qty 1

## 2022-11-27 MED ORDER — LIDOCAINE HCL URETHRAL/MUCOSAL 2 % EX GEL
1.0000 | Freq: Once | CUTANEOUS | Status: AC
  Administered 2022-11-27: 1 via URETHRAL
  Filled 2022-11-27 (×2): qty 6

## 2022-11-27 MED ORDER — ENSURE ENLIVE PO LIQD
237.0000 mL | Freq: Two times a day (BID) | ORAL | Status: DC
  Administered 2022-11-28: 237 mL via ORAL

## 2022-11-27 MED ORDER — ADULT MULTIVITAMIN W/MINERALS CH
1.0000 | ORAL_TABLET | Freq: Every day | ORAL | Status: DC
  Administered 2022-11-27 – 2022-11-28 (×2): 1 via ORAL
  Filled 2022-11-27 (×2): qty 1

## 2022-11-27 MED ORDER — ADULT MULTIVITAMIN W/MINERALS CH: 1.0000 | ORAL_TABLET | Freq: Every day | ORAL | Status: DC

## 2022-11-27 NOTE — Progress Notes (Signed)
Spoke with pt's daughter and gave an update. Daughter states whenever the pt is ready for discharge she would be taking him back to the facility.

## 2022-11-27 NOTE — Evaluation (Signed)
Occupational Therapy Evaluation Patient Details Name: Russell Ayers MRN: [id number] DOB: 01/19/1933 Today's Date: 11/27/2022   History of Present Illness Pt is an 89 y.o. male who presented 11/25/22 s/p fall and concern for syncopal episode. MRI of brain negative for acute intracranial abnormality. Admitted with sepsis possibly from UTI along with NSTEMI. PMH: HTN, BPH, s/p chronic foley catheter placement, anemia, generalized anxiety disorder, insomnia, DM2, non insulin dependent, CVA, seizures   Clinical Impression   Pt reports walking about 100 feet, usually pushing his w/c in his ALF [facility]). He can self feed and groom and relies on assist for bathing, dressing and all IADLs. Pt presents with generalized weakness, requiring min assist to stand from elevated bed and min guard assist to walk with RW in his room. He was able to release the walker in static standing with one hand and maintain balance. He completed grooming with set up in sitting. Recommending HHOT at his ALF if staff can provide the level of care he requires. Will follow acutely.      Recommendations for follow up therapy are one component of a multi-disciplinary discharge planning process, led by the attending physician.  Recommendations may be updated based on patient status, additional functional criteria and insurance authorization.   Assistance Recommended at Discharge Frequent or constant Supervision/Assistance  Patient can return home with the following A little help with walking and/or transfers;A lot of help with bathing/dressing/bathroom;Assistance with cooking/housework;Direct supervision/assist for medications management;Direct supervision/assist for financial management;Assist for transportation;Help with stairs or ramp for entrance    Functional Status Assessment  Patient has had a recent decline in their functional status and demonstrates the ability to make significant improvements in function in a  reasonable and predictable amount of time.  Equipment Recommendations  None recommended by OT    Recommendations for Other Services       Precautions / Restrictions Precautions Precautions: Fall Restrictions Weight Bearing Restrictions: No      Mobility Bed Mobility Overal bed mobility: Needs Assistance Bed Mobility: Supine to Sit     Supine to sit: Supervision, HOB elevated          Transfers Overall transfer level: Needs assistance Equipment used: Rolling walker (2 wheels) Transfers: Sit to/from Stand Sit to Stand: Min assist, From elevated surface           General transfer comment: increased time, assist to rise and steady      Balance Overall balance assessment: Needs assistance   Sitting balance-Leahy Scale: Fair     Standing balance support: Bilateral upper extremity supported, During functional activity, Reliant on assistive device for balance Standing balance-Leahy Scale: Poor Standing balance comment: can release walker in static standing with one hand                           ADL either performed or assessed with clinical judgement   ADL Overall ADL's : Needs assistance/impaired Eating/Feeding: Independent;Sitting   Grooming: Set up;Sitting;Wash/dry hands;Wash/dry face   Upper Body Bathing: Minimal assistance;Sitting   Lower Body Bathing: Total assistance;Sit to/from stand   Upper Body Dressing : Minimal assistance;Sitting   Lower Body Dressing: Total assistance;Sit to/from stand   Toilet Transfer: Min guard;Ambulation;Rolling walker (2 wheels)           Functional mobility during ADLs: Min guard;Rolling walker (2 wheels)       Vision Baseline Vision/History: 1 Wears glasses Ability to See in Adequate Light: 0 Adequate Patient Visual  Report: No change from baseline       Perception     Praxis      Pertinent Vitals/Pain Pain Assessment Pain Assessment: No/denies pain     Hand Dominance Right    Extremity/Trunk Assessment Upper Extremity Assessment Upper Extremity Assessment: LUE deficits/detail;Generalized weakness LUE Deficits / Details: longstanding shoulder limitations LUE Coordination: decreased gross motor   Lower Extremity Assessment Lower Extremity Assessment: Defer to PT evaluation   Cervical / Trunk Assessment Cervical / Trunk Assessment: Kyphotic   Communication Communication Communication: HOH   Cognition Arousal/Alertness: Awake/alert Behavior During Therapy: WFL for tasks assessed/performed Overall Cognitive Status: No family/caregiver present to determine baseline cognitive functioning                                 General Comments: poor historian     General Comments       Exercises     Shoulder Instructions      Home Living Family/patient expects to be discharged to:: Assisted living [facility])                             Home Equipment: Agricultural consultant (2 wheels);Wheelchair - manual;Shower seat;Grab bars - toilet;Grab bars - tub/shower;Hand held shower head          Prior Functioning/Environment Prior Level of Function : History of Falls (last six months);Patient poor historian/Family not available             Mobility Comments: uses a RW vs manual w/c for mobility mod I, per pt; hx of falls; reports increased difficulty managing his manual w/c due to L shoulder issues ADLs Comments: self feeds, assisted for bathing and dressing and all IADLs        OT Problem List: Decreased strength;Decreased activity tolerance;Decreased cognition      OT Treatment/Interventions: Self-care/ADL training;Therapeutic activities;Patient/family education;Balance training;DME and/or AE instruction    OT Goals(Current goals can be found in the care plan section) Acute Rehab OT Goals OT Goal Formulation: With patient Time For Goal Achievement: 12/11/22 Potential to Achieve Goals: Good ADL Goals Pt Will Perform  Grooming: with supervision;standing Pt Will Transfer to Toilet: with supervision;ambulating;bedside commode Pt Will Perform Toileting - Clothing Manipulation and hygiene: with supervision;sit to/from stand  OT Frequency: Min 1X/week    Co-evaluation              AM-PAC OT "6 Clicks" Daily Activity     Outcome Measure Help from another person eating meals?: None Help from another person taking care of personal grooming?: A Little Help from another person toileting, which includes using toliet, bedpan, or urinal?: Total Help from another person bathing (including washing, rinsing, drying)?: A Lot Help from another person to put on and taking off regular upper body clothing?: A Little Help from another person to put on and taking off regular lower body clothing?: Total 6 Click Score: 14   End of Session Equipment Utilized During Treatment: Gait belt;Rolling walker (2 wheels) Nurse Communication: Mobility status  Activity Tolerance: Patient tolerated treatment well Patient left: in chair;with call bell/phone within reach;with chair alarm set  OT Visit Diagnosis: Unsteadiness on feet (R26.81);Other abnormalities of gait and mobility (R26.89);Muscle weakness (generalized) (M62.81);Other symptoms and signs involving cognitive function                Time: 1338-1400 OT Time Calculation (min): 22 min Charges:  OT General Charges $OT Visit: 1 Visit OT Evaluation $OT Eval Moderate Complexity: 1 Mod  Berna Spare, OTR/L Acute Rehabilitation Services Office: [phone]  Evern Bio 11/27/2022, 3:32 PM

## 2022-11-27 NOTE — Progress Notes (Signed)
Initial Nutrition Assessment  DOCUMENTATION CODES:   Severe malnutrition in context of chronic illness  INTERVENTION:   - Ensure Enlive po BID, each supplement provides 350 kcal and 20 grams of protein  - Magic Cup TID with meals, each supplement provides 290 kcal and 9 grams of protein  - MVI with minerals daily  - Recommend adding folic acid 1 mg daily due to low folate  NUTRITION DIAGNOSIS:   Severe Malnutrition related to chronic illness as evidenced by moderate fat depletion, severe muscle depletion, percent weight loss (11.9% weight loss in < 5 months).  GOAL:   Patient will meet greater than or equal to 90% of their needs  MONITOR:   PO intake, Supplement acceptance, Labs, Weight trends  REASON FOR ASSESSMENT:   Consult Assessment of nutrition requirement/status  ASSESSMENT:   89 year old male who presented to the ED on 7/06 after a fall. PMH of HTN, BPH, chronic foley, anemia, GAD, insomnia, T2DM, stroke, seizures. Pt admitted with sepsis possibly from UTI.  Spoke with pt at bedside. Pt states that he cannot remember if he received a lunch tray. Discussed with RN who reports pt did receive a lunch tray but is unsure how much pt ate.  Per Palliative Medicine note, it has been recommended that pt be on a dysphagia 2 diet with nectar-thick liquids but pt does not like thickened liquids. Pt is on thin liquids per pt's daughter's request.  Pt reports that he does not have a good appetite at present but usually has a good appetite. Pt reports eating 3 meals daily and eating most of what he is served. Pt amenable to receiving an oral nutrition supplement this admission. RD provided pt with an Ensure at time of visit. RN aware. RD will also order Magic Cups to come with all meal trays as well as daily MVI with minerals.  Pt reports that his weight fluctuates between 140-170 lbs. He is unsure whether he has lost any weight recently. Reviewed weight history in chart. Pt with  an 8.6 kg weight loss since 07/16/22. This is an 11.9% weight loss in less than 5 months which is severe and significant for timeframe. Based on weight loss and NFPE, pt meets criteria for severe malnutrition.  Meal Completion: 20-50%  Medications reviewed and include: ferrous sulfate, SSI, protonix, vitamin B12 100 mcg daily, IV abx, IV ferric gluconate  Micronutrient Profile: Vitamin B12: 429 (WNL) Folate B9: 4.0 (low)  Labs reviewed: sodium 133, hemoglobin 7.5, hemoglobin A1C 7.1 CBG's: 105-140 x 24 hours  NUTRITION - FOCUSED PHYSICAL EXAM:  Flowsheet Row Most Recent Value  Orbital Region Moderate depletion  Upper Arm Region Mild depletion  Thoracic and Lumbar Region Moderate depletion  Buccal Region Moderate depletion  Temple Region Moderate depletion  Clavicle Bone Region Severe depletion  Clavicle and Acromion Bone Region Severe depletion  Scapular Bone Region Moderate depletion  Dorsal Hand Moderate depletion  Patellar Region Severe depletion  Anterior Thigh Region Severe depletion  Posterior Calf Region Severe depletion  Edema (RD Assessment) Mild  [BLE]  Hair Reviewed  Eyes Reviewed  Mouth Reviewed  Skin Reviewed  Nails Reviewed    Diet Order:   Diet Order             DIET DYS 2 Room service appropriate? Yes; Fluid consistency: Thin  Diet effective now                   EDUCATION NEEDS:   Education needs have been addressed  Skin:  Skin Assessment: Reviewed RN Assessment  Last BM:  11/24/22  Height:   Ht Readings from Last 1 Encounters:  11/25/22 5\' 11"  (1.803 m)    Weight:   Wt Readings from Last 1 Encounters:  11/25/22 63.5 kg    BMI:  Body mass index is 19.53 kg/m.  Estimated Nutritional Needs:   Kcal:  1600-1800  Protein:  75-90 grams  Fluid:  1.6-1.8 L    Mertie Clause, MS, RD, LDN Inpatient Clinical Dietitian Please see AMiON for contact information.

## 2022-11-27 NOTE — Progress Notes (Signed)
PROGRESS NOTE    Russell Ayers  ZOX:[id number] DOB: [birth year] DOA: 11/25/2022 PCP: Patient, No Pcp Per   Brief Narrative:  Russell Ayers is a 89 y.o. male with medical history significant of hypertension, BPH, s/p chronic foley catheter placement, anemia , generalized anxiety disorder, insomnia, type 2 DM, non insulin dependent, stroke , seizures, was brought in from long term SNF for syncope.  He was admitted for sepsis, present on admission secondary to UTI in the setting of chronic Foley catheter.  He is also noted to have some elevated troponins likely in the setting of demand ischemia from sepsis as well as worsening anemia of unclear etiology.  Assessment & Plan:   Principal Problem:   Sepsis (HCC) Active Problems:   Essential hypertension   BPH (benign prostatic hyperplasia)   Recurrent falls   Type 2 diabetes mellitus with other specified complication (HCC)   UTI (urinary tract infection) due to urinary indwelling catheter (HCC)   Syncope and collapse  Assessment and Plan:   Sepsis possibly from UTI.  On admission , he was hypotensive, tachycardic, with leukocytosis and elevated lactate and possible UTI.  Admit to progressive bed, . IV fluids with improvement in BP parameters. Keep MAP>65 .  Candida UTI, now on fluconazole  Replace foley catheter if needed based on last days of exchange Trend lactate and follow wbc count.      Elevated troponins/ NSTEMI possibly demand ischemia from sepsis. Trend troponins, EKG does not show ischemic changes. Patient denies any chest pain or sob. But he did have syncope twice in the last 2 weeks. Echo with preserved LVEF Currently without any chest pain Will start him on aspirin.  Probably not a candidate for IV heparin due to anemia . Will get lipid panel in am.    Syncope of unclear etiology Twice in the last 2 weeks.  CT head without acute pathology.  Monitor overnight on telemetry to check for arrhythmias.  MRI brain with  no acute findings Check orthostatics in am.  Therapy evaluations ordered and pending     Anemia of unclear etiology with noted iron deficiency Daughter reports that staff at SNF reports some blood in the stools in the past.  Will get stool for occult blood, still pending Order IV iron His baseline hemoglobin appears to be around 9 currently at 7.9, will probably drop further with IV fluids.  Check cbc in am.  Transfuse to keep hemoglobin greater than 7.  Daughter is not aware of any colonoscopy or endoscopies in the past.    Hypotension:  BP parameters improved with IV fluids.      BPH  Restarted meds.      Generalized anxiety disorder.  Resume ativan .      Type 2 DM;  Non insulin dependent.  Stop metformin for elevated lactic acid.  A1c 6.1% Continue with SSI.    PT/OT Evaluations ordered.    In view of his age, multiple co morbidities, sepsis and anemia and possible NSTEMI, palliative care consulted for goals of care.    DVT prophylaxis:Lovenox Code Status: DNR Family Communication: Discussed with daughter on phone 7/7 Disposition Plan:  Status is: Inpatient Remains inpatient appropriate because: Need for IV medications   Consultants:  Palliative  Procedures:  None  Antimicrobials:  Anti-infectives (From admission, onward)    Start     Dose/Rate Route Frequency Ordered Stop   11/27/22 1415  fluconazole (DIFLUCAN) tablet 200 mg        200  mg Oral Daily 11/27/22 1329     11/26/22 1200  cefTRIAXone (ROCEPHIN) 1 g in sodium chloride 0.9 % 100 mL IVPB  Status:  Discontinued        1 g 200 mL/hr over 30 Minutes Intravenous Every 24 hours 11/25/22 1741 11/27/22 1329   11/25/22 1630  cefTRIAXone (ROCEPHIN) 1 g in sodium chloride 0.9 % 100 mL IVPB        1 g 200 mL/hr over 30 Minutes Intravenous  Once 11/25/22 1627 11/25/22 1721       Subjective: Patient seen and evaluated today with no new acute complaints or concerns. No acute concerns or events noted  overnight.  Objective: Vitals:   11/26/22 2300 11/27/22 0400 11/27/22 0843 11/27/22 1152  BP: (!) 103/51 104/70 114/63 130/71  Pulse: 69 83 81 82  Resp: 18 20 19 18   Temp: 97.7 F (36.5 C) (!) 97.4 F (36.3 C) 97.6 F (36.4 C) (!) 97.5 F (36.4 C)  TempSrc: Axillary Oral Oral Oral  SpO2: 96% 95% (!) 89% 95%  Weight:      Height:        Intake/Output Summary (Last 24 hours) at 11/27/2022 1531 Last data filed at 11/27/2022 1518 Gross per 24 hour  Intake 3123.44 ml  Output 1500 ml  Net 1623.44 ml   Filed Weights   11/25/22 1421  Weight: 63.5 kg    Examination:  General exam: Appears calm and comfortable  Respiratory system: Clear to auscultation. Respiratory effort normal. Cardiovascular system: S1 & S2 heard, RRR.  Gastrointestinal system: Abdomen is soft Central nervous system: Alert and awake Extremities: No edema Skin: No significant lesions noted Psychiatry: Flat affect. Foley catheter with clear, yellow urine output    Data Reviewed: I have personally reviewed following labs and imaging studies  CBC: Recent Labs  Lab 11/25/22 1443 11/26/22 0021 11/26/22 0704 11/27/22 0023  WBC 19.1* 8.8  --  7.6  NEUTROABS 17.4* 5.9  --   --   HGB 7.9* 7.0* 7.2* 7.5*  HCT 26.1* 22.8* 23.2* 24.1*  MCV 89.1 87.4  --  89.9  PLT 456* 388  --  379   Basic Metabolic Panel: Recent Labs  Lab 11/25/22 1443 11/26/22 0021 11/27/22 0023  NA 133* 131* 133*  K 3.9 4.3 3.8  CL 97* 99 103  CO2 19* 26 24  GLUCOSE 149* 127* 115*  BUN 11 10 6*  CREATININE 1.15 1.01 0.74  CALCIUM 8.8* 8.2* 8.1*  MG  --   --  1.8   GFR: Estimated Creatinine Clearance: 57.3 mL/min (by C-G formula based on SCr of 0.74 mg/dL). Liver Function Tests: Recent Labs  Lab 11/26/22 0021  AST 27  ALT 13  ALKPHOS 100  BILITOT 0.3  PROT 6.0*  ALBUMIN 2.2*   No results for input(s): "LIPASE", "AMYLASE" in the last 168 hours. No results for input(s): "AMMONIA" in the last 168 hours. Coagulation  Profile: No results for input(s): "INR", "PROTIME" in the last 168 hours. Cardiac Enzymes: No results for input(s): "CKTOTAL", "CKMB", "CKMBINDEX", "TROPONINI" in the last 168 hours. BNP (last 3 results) No results for input(s): "PROBNP" in the last 8760 hours. HbA1C: Recent Labs    11/26/22 0021  HGBA1C 6.1*   CBG: Recent Labs  Lab 11/26/22 1212 11/26/22 1516 11/26/22 2119 11/27/22 0625 11/27/22 1151  GLUCAP 140* 135* 109* 105* 111*   Lipid Profile: No results for input(s): "CHOL", "HDL", "LDLCALC", "TRIG", "CHOLHDL", "LDLDIRECT" in the last 72 hours. Thyroid Function Tests: No  results for input(s): "TSH", "T4TOTAL", "FREET4", "T3FREE", "THYROIDAB" in the last 72 hours. Anemia Panel: Recent Labs    11/26/22 0021  VITAMINB12 429  FOLATE 4.0*  FERRITIN 152  TIBC 189*  IRON 15*  RETICCTPCT 1.9   Sepsis Labs: Recent Labs  Lab 11/25/22 1446 11/25/22 1737  LATICACIDVEN 4.3* 2.1*    Recent Results (from the past 240 hour(s))  Urine Culture     Status: Abnormal   Collection Time: 11/25/22  4:27 PM   Specimen: Urine, Bag (ped)  Result Value Ref Range Status   Specimen Description URINE, BAG PED  Final   Special Requests   Final    NONE Performed at [facility], [address], [place], [place] [address]    Culture >=100,000 COLONIES/mL YEAST (A)  Final   Report Status 11/27/2022 FINAL  Final  MRSA Next Gen by PCR, Nasal     Status: None   Collection Time: 11/26/22  5:35 AM   Specimen: Nasal Mucosa; Nasal Swab  Result Value Ref Range Status   MRSA by PCR Next Gen NOT DETECTED NOT DETECTED Final    Comment: (NOTE) The GeneXpert MRSA Assay (FDA approved for NASAL specimens only), is one component of a comprehensive MRSA colonization surveillance program. It is not intended to diagnose MRSA infection nor to guide or monitor treatment for MRSA infections. Test performance is not FDA approved in patients less than 30 years old. Performed at [facility], [address] [place], [place] [address]          Radiology Studies: ECHOCARDIOGRAM COMPLETE  Result Date: 11/27/2022    ECHOCARDIOGRAM REPORT   Patient Name:   Russell Ayers Date of Exam: 11/27/2022 Medical Rec #:  [id number]      Height:       71.0 in Accession #:    [id number]     Weight:       140.0 lb Date of Birth:  06-22-1932      BSA:          1.812 m Patient Age:    89 years       BP:           114/63 mmHg Patient Gender: M              HR:           115 bpm. Exam Location:  Inpatient Procedure: 2D Echo, Cardiac Doppler and Color Doppler Indications:    Syncope R55  History:        Patient has prior history of Echocardiogram examinations, most                 recent 02/25/2019. Signs/Symptoms:Syncope; Risk                 Factors:Hypertension and Diabetes.  Sonographer:    Lucendia Herrlich Referring Phys: Herminio Heads IMPRESSIONS  1. Left ventricular ejection fraction, by estimation, is 55%. The left ventricle has normal function. The left ventricle has no regional wall motion abnormalities. Left ventricular diastolic parameters are consistent with Grade I diastolic dysfunction (impaired relaxation).  2. Right ventricular systolic function is normal. The right ventricular size is normal. There is moderately elevated pulmonary artery systolic pressure. The estimated right ventricular systolic pressure is 45.5 mmHg.  3. Left atrial size was severely dilated.  4. Right atrial size was moderately dilated.  5. The mitral valve is degenerative. Mild mitral valve regurgitation. No evidence of mitral stenosis.  6. Tricuspid valve regurgitation is moderate.  7. The aortic valve is tricuspid. Aortic valve regurgitation is not visualized. Aortic valve sclerosis/calcification is present, without any evidence of aortic stenosis.  8. The inferior vena cava is dilated in size with <50% respiratory variability, suggesting right atrial pressure of 15 mmHg. FINDINGS  Left Ventricle: Left ventricular  ejection fraction, by estimation, is 55%. The left ventricle has normal function. The left ventricle has no regional wall motion abnormalities. The left ventricular internal cavity size was normal in size. There is no left ventricular hypertrophy. Abnormal (paradoxical) septal motion, consistent with left bundle branch block. Left ventricular diastolic parameters are consistent with Grade I diastolic dysfunction (impaired relaxation). Right Ventricle: The right ventricular size is normal. No increase in right ventricular wall thickness. Right ventricular systolic function is normal. There is moderately elevated pulmonary artery systolic pressure. The tricuspid regurgitant velocity is 2.76 m/s, and with an assumed right atrial pressure of 15 mmHg, the estimated right ventricular systolic pressure is 45.5 mmHg. Left Atrium: Left atrial size was severely dilated. Right Atrium: Right atrial size was moderately dilated. Pericardium: Trivial pericardial effusion is present. The pericardial effusion is localized near the right atrium. Mitral Valve: The mitral valve is degenerative in appearance. Mild mitral annular calcification. Mild mitral valve regurgitation. No evidence of mitral valve stenosis. Tricuspid Valve: The tricuspid valve is normal in structure. Tricuspid valve regurgitation is moderate . No evidence of tricuspid stenosis. Aortic Valve: The aortic valve is tricuspid. Aortic valve regurgitation is not visualized. Aortic valve sclerosis/calcification is present, without any evidence of aortic stenosis. Aortic valve peak gradient measures 10.9 mmHg. Pulmonic Valve: The pulmonic valve was normal in structure. Pulmonic valve regurgitation is trivial. No evidence of pulmonic stenosis. Aorta: The aortic root is normal in size and structure. Venous: The inferior vena cava is dilated in size with less than 50% respiratory variability, suggesting right atrial pressure of 15 mmHg. IAS/Shunts: No atrial level shunt  detected by color flow Doppler.  LEFT VENTRICLE PLAX 2D LVIDd:         4.90 cm   Diastology LVIDs:         3.50 cm   LV e' medial:    5.87 cm/s LV PW:         0.70 cm   LV E/e' medial:  16.5 LV IVS:        0.80 cm   LV e' lateral:   9.25 cm/s LVOT diam:     2.20 cm   LV E/e' lateral: 10.5 LV SV:         75 LV SV Index:   41 LVOT Area:     3.80 cm  RIGHT VENTRICLE             IVC RV S prime:     13.30 cm/s  IVC diam: 2.50 cm TAPSE (M-mode): 1.6 cm LEFT ATRIUM             Index        RIGHT ATRIUM           Index LA diam:        4.90 cm 2.70 cm/m   RA Area:     21.90 cm LA Vol (A2C):   45.1 ml 24.89 ml/m  RA Volume:   66.60 ml  36.76 ml/m LA Vol (A4C):   71.1 ml 39.24 ml/m LA Biplane Vol: 63.0 ml 34.77 ml/m  AORTIC VALVE AV Area (Vmax): 2.43 cm AV Vmax:  165.00 cm/s AV Peak Grad:   10.9 mmHg LVOT Vmax:      105.57 cm/s LVOT Vmean:     67.900 cm/s LVOT VTI:       0.197 m  AORTA Ao Root diam: 3.60 cm Ao Asc diam:  3.50 cm MITRAL VALVE                TRICUSPID VALVE MV Area (PHT): 6.12 cm     TR Peak grad:   30.5 mmHg MV Decel Time: 124 msec     TR Vmax:        276.00 cm/s MR Peak grad: 27.9 mmHg MR Vmax:      264.00 cm/s   SHUNTS MV E velocity: 96.70 cm/s   Systemic VTI:  0.20 m MV A velocity: 130.00 cm/s  Systemic Diam: 2.20 cm MV E/A ratio:  0.74 Arvilla Meres MD Electronically signed by Arvilla Meres MD Signature Date/Time: 11/27/2022/2:55:43 PM    Final    MR BRAIN WO CONTRAST  Result Date: 11/25/2022 CLINICAL DATA:  Syncope EXAM: MRI HEAD WITHOUT CONTRAST TECHNIQUE: Multiplanar, multiecho pulse sequences of the brain and surrounding structures were obtained without intravenous contrast. COMPARISON:  07/28/2020 FINDINGS: Brain: No acute infarct, mass effect or extra-axial collection. No acute or chronic hemorrhage. There is multifocal hyperintense T2-weighted signal within the white matter. Generalized volume loss. The midline structures are normal. Vascular: Major flow voids are preserved.  Skull and upper cervical spine: Normal calvarium and skull base. Visualized upper cervical spine and soft tissues are normal. Sinuses/Orbits:No paranasal sinus fluid levels or advanced mucosal thickening. No mastoid or middle ear effusion. Normal orbits. IMPRESSION: 1. No acute intracranial abnormality. 2. Findings of chronic small vessel ischemia and volume loss. Electronically Signed   By: Deatra Robinson M.D.   On: 11/25/2022 23:16        Scheduled Meds:  aspirin  81 mg Oral Daily   Chlorhexidine Gluconate Cloth  6 each Topical Daily   diclofenac Sodium  2 g Topical BID   enoxaparin (LOVENOX) injection  40 mg Subcutaneous Q24H   escitalopram  20 mg Oral Daily   [START ON 11/28/2022] feeding supplement  237 mL Oral BID BM   ferrous sulfate  325 mg Oral Daily   finasteride  5 mg Oral Daily   fluconazole  200 mg Oral Daily   insulin aspart  0-6 Units Subcutaneous TID WC   latanoprost  1 drop Both Eyes QPM   lithium carbonate  150 mg Oral QHS   mirabegron ER  25 mg Oral Daily   pantoprazole  40 mg Oral Daily   tamsulosin  0.4 mg Oral QPM   temazepam  15 mg Oral QHS   timolol  1 drop Both Eyes QPM   traZODone  100 mg Oral QHS   vitamin B-12  100 mcg Oral Daily   Continuous Infusions:  sodium chloride 125 mL/hr at 11/27/22 1146     LOS: 2 days    Time spent: 35 minutes    Sajjad Honea Hoover Brunette, DO [facility]  If 7PM-7AM, please contact night-coverage [email or web address] 11/27/2022, 3:31 PM

## 2022-11-27 NOTE — TOC Initial Note (Signed)
Transition of Care [facility]) - Initial/Assessment Note    Patient Details  Name: Russell Ayers MRN: [id number] Date of Birth: [birth year]  [facility]) CM/SW Contact:    Harriet Masson, RN Phone Number: 11/27/2022, 12:56 PM  Clinical Narrative:                  Spoke to patient regarding transition needs.  Patient lives at [facility] in an apartment alone.  Daughter can transport patient home.  DME-has walker and wheelchair. Spoke to Physical thearpy at [facility] who stated in the DC summary to add PT/OT eval and treat. TOC following.   Expected Discharge Plan: Assisted Living Barriers to Discharge: Continued Medical Work up   Patient Goals and CMS Choice Patient states their goals for this hospitalization and ongoing recovery are:: return to ALF          Expected Discharge Plan and Services   Discharge Planning Services: CM Consult Post Acute Care Choice: Home Health Living arrangements for the past 2 months: Assisted Living Facility                                      Prior Living Arrangements/Services Living arrangements for the past 2 months: Assisted Living Facility Lives with:: Self Patient language and need for interpreter reviewed:: Yes        Need for Family Participation in Patient Care: Yes (Comment) Care giver support system in place?: Yes (comment)   Criminal Activity/Legal Involvement Pertinent to Current Situation/Hospitalization: No - Comment as needed  Activities of Daily Living   ADL Screening (condition at time of admission) Patient's cognitive ability adequate to safely complete daily activities?: Yes Is the patient deaf or have difficulty hearing?: Yes Does the patient have difficulty seeing, even when wearing glasses/contacts?: No Does the patient have difficulty concentrating, remembering, or making decisions?: No Patient able to express need for assistance with ADLs?: Yes Does the patient have  difficulty dressing or bathing?: Yes Independently performs ADLs?: No Communication: Independent Dressing (OT): Needs assistance Is this a change from baseline?: Change from baseline, expected to last <3days Grooming: Needs assistance Is this a change from baseline?: Change from baseline, expected to last <3 days Feeding: Independent Bathing: Needs assistance Is this a change from baseline?: Change from baseline, expected to last <3 days Toileting: Needs assistance Is this a change from baseline?: Change from baseline, expected to last <3 days In/Out Bed: Needs assistance Is this a change from baseline?: Change from baseline, expected to last <3 days Walks in Home: Needs assistance Is this a change from baseline?: Change from baseline, expected to last <3 days Does the patient have difficulty walking or climbing stairs?: Yes Weakness of Legs: Both Weakness of Arms/Hands: Both  Permission Sought/Granted                  Emotional Assessment Appearance:: Appears stated age Attitude/Demeanor/Rapport: Engaged Affect (typically observed): Accepting Orientation: : Oriented to Self, Oriented to  Time, Oriented to Place, Oriented to Situation Alcohol / Substance Use: Not Applicable Psych Involvement: No (comment)  Admission diagnosis:  Sepsis [facility]) [A41.9] Patient Active Problem List   Diagnosis Date Noted   Sepsis (HCC) 11/25/2022   Syncope and collapse 11/25/2022   Hyponatremia 09/30/2022   UTI (urinary tract infection) due to urinary indwelling catheter (HCC) 09/30/2022   UTI (urinary tract infection) due to urinary indwelling  Foley catheter (HCC) 09/30/2022   Iron deficiency anemia due to chronic blood loss 09/30/2022   Left lower lobe pneumonia 07/16/2022   Constipation 07/16/2022   Generalized anxiety disorder 07/16/2022   Glaucoma 07/16/2022   Memory impairment 07/16/2022   Primary insomnia 07/16/2022   Recurrent falls 07/16/2022   Type 2 diabetes mellitus with  other specified complication (HCC) 07/16/2022   Groin pain 07/16/2022   Prediabetes 02/05/2021   BPH (benign prostatic hyperplasia) 02/05/2021   S/p left hip fracture 02/04/2021   Left lower quadrant abdominal pain    Seizure (HCC)    SBO (small bowel obstruction) (HCC) 07/26/2020   Essential hypertension 07/26/2020   Diabetic neuropathy (HCC) 04/30/2020   Pain due to onychomycosis of toenails of both feet 11/29/2018   PCP:  Patient, No Pcp Per Pharmacy:   [facility] [place], [place] - [address] [place] [address] Phone: [phone] Fax: [phone]  [id number] - [place], [place] - [address] AT [facility] OF [address] [place] [id number] Phone: [phone] Fax: [phone]     Social Determinants of Health (SDOH) Social History: SDOH Screenings   Food Insecurity: No Food Insecurity (11/25/2022)  Housing: Low Risk  (11/25/2022)  Transportation Needs: No Transportation Needs (11/25/2022)  Utilities: Not At Risk (10/01/2022)  Tobacco Use: Medium Risk (10/01/2022)   SDOH Interventions:     Readmission Risk Interventions    07/18/2022   12:00 PM 07/17/2022    2:49 PM  Readmission Risk Prevention Plan  Transportation Screening Complete Complete  PCP or Specialist Appt within 5-7 Days Complete Complete  Home Care Screening Complete Complete  Medication Review (RN CM) Complete Complete

## 2022-11-27 NOTE — Plan of Care (Signed)

## 2022-11-27 NOTE — Progress Notes (Signed)
Echocardiogram 2D Echocardiogram has been performed.  Russell Ayers 11/27/2022, 12:01 PM

## 2022-11-27 NOTE — Progress Notes (Signed)
Mobility Specialist Progress Note:   11/27/22 1544  Therapy Vitals  Temp 98.1 F (36.7 C)  Temp Source Oral  Patient Position (if appropriate) Lying  Oxygen Therapy  O2 Device Room Air  Mobility  Activity Ambulated with assistance in hallway  Level of Assistance +2 (takes two people)  Press photographer wheel walker  Distance Ambulated (ft) 75 ft  Activity Response Tolerated fair  Mobility Referral Yes  $Mobility charge 1 Mobility  Mobility Specialist Start Time (ACUTE ONLY) 1435  Mobility Specialist Stop Time (ACUTE ONLY) 1452  Mobility Specialist Time Calculation (min) (ACUTE ONLY) 17 min    Pt received in chair, agreeable to mobility. Stated he was "not too good" d/t fatigue, otherwise no c/o. MinA for sit to stand. Ambulated in hallway w/ RW and MinG, with chair follow. Pt unable to ambulate back to room so he was wheeled back in chair. Pt left in bed at request with call bell and bed alarm on.  Russell Ayers Mobility Specialist Please contact via Special educational needs teacher or Rehab office at [phone]

## 2022-11-28 DIAGNOSIS — A419 Sepsis, unspecified organism: Secondary | ICD-10-CM | POA: Diagnosis not present

## 2022-11-28 DIAGNOSIS — E43 Unspecified severe protein-calorie malnutrition: Secondary | ICD-10-CM | POA: Insufficient documentation

## 2022-11-28 LAB — CBC
HCT: 26.8 % — ABNORMAL LOW (ref 39.0–52.0)
Hemoglobin: 8.3 g/dL — ABNORMAL LOW (ref 13.0–17.0)
MCH: 28.1 pg (ref 26.0–34.0)
MCHC: 31 g/dL (ref 30.0–36.0)
MCV: 90.8 fL (ref 80.0–100.0)
Platelets: 387 10*3/uL (ref 150–400)
RBC: 2.95 MIL/uL — ABNORMAL LOW (ref 4.22–5.81)
RDW: 14.4 % (ref 11.5–15.5)
WBC: 8.3 10*3/uL (ref 4.0–10.5)
nRBC: 0 % (ref 0.0–0.2)

## 2022-11-28 LAB — GLUCOSE, CAPILLARY: Glucose-Capillary: 104 mg/dL — ABNORMAL HIGH (ref 70–99)

## 2022-11-28 MED ORDER — TEMAZEPAM 15 MG PO CAPS: 15.0000 mg | ORAL_CAPSULE | Freq: Every day | ORAL | 0 refills | Status: DC

## 2022-11-28 MED ORDER — LORAZEPAM 1 MG PO TABS: 1.0000 mg | ORAL_TABLET | Freq: Two times a day (BID) | ORAL | 0 refills | Status: DC | PRN

## 2022-11-28 MED ORDER — TEMAZEPAM 15 MG PO CAPS: 15.0000 mg | ORAL_CAPSULE | Freq: Every day | ORAL | 0 refills | Status: AC

## 2022-11-28 MED ORDER — ENSURE ENLIVE PO LIQD: 237.0000 mL | Freq: Two times a day (BID) | ORAL | 12 refills | Status: AC

## 2022-11-28 MED ORDER — LORAZEPAM 1 MG PO TABS: 1.0000 mg | ORAL_TABLET | Freq: Two times a day (BID) | ORAL | 0 refills | Status: AC | PRN

## 2022-11-28 MED ORDER — FLUCONAZOLE 200 MG PO TABS: 200.0000 mg | ORAL_TABLET | Freq: Every day | ORAL | 0 refills | Status: AC

## 2022-11-28 MED ORDER — ADULT MULTIVITAMIN W/MINERALS CH: 1.0000 | ORAL_TABLET | Freq: Every day | ORAL | 0 refills | Status: AC

## 2022-11-28 NOTE — Discharge Summary (Signed)
Physician Discharge Summary  Russell Ayers ZHY:[id number] DOB: 08-04-1932 DOA: 11/25/2022  PCP: Patient, No Pcp Per  Admit date: 11/25/2022  Discharge date: 11/28/2022  Admitted From:ALF  Disposition:  ALF  Recommendations for Outpatient Follow-up:  Follow up with PCP in 1-2 weeks Continue on Diflucan as prescribed for Candida UTI and Foley catheter exchange during this admission Follow-up CBC in 1 week Continue other home medications as prior  Home Health: Yes with PT/OT  Equipment/Devices: Chronic Foley  Discharge Condition:Stable  CODE STATUS: DNR  Diet recommendation: Heart Healthy/carb modified  Brief/Interim Summary:  Russell Ayers is a 89 y.o. male with medical history significant of hypertension, BPH, s/p chronic foley catheter placement, anemia , generalized anxiety disorder, insomnia, type 2 DM, non insulin dependent, stroke , seizures, was brought in from long term SNF for syncope.  He was admitted for sepsis, present on admission secondary to UTI in the setting of chronic Foley catheter.  He is also noted to have some elevated troponins likely in the setting of demand ischemia from sepsis as well as worsening anemia of unclear etiology.  He has had no significant findings on workup and 2D echocardiogram shows preserved LVEF.  No significant arrhythmias noted during the course of this admission.  Urine cultures positive for Candida and he will be discharged on fluconazole.  He was noted to have some mild anemia with no overt bleeding noted and was noted to have some iron deficiency for which he received IV supplementation.  He has been seen by PT with recommendations for home health services which will be arranged.  No other acute events or concerns noted.  Discharge Diagnoses:  Principal Problem:   Sepsis (HCC) Active Problems:   Essential hypertension   BPH (benign prostatic hyperplasia)   Recurrent falls   Type 2 diabetes mellitus with other specified complication  (HCC)   UTI (urinary tract infection) due to urinary indwelling catheter (HCC)   Syncope and collapse  Principal discharge diagnosis: Sepsis, present on admission secondary to Candida UTI in the setting of chronic Foley catheter use.  Associated demand ischemia/NSTEMI as well as syncope.  Discharge Instructions  Discharge Instructions     Diet - low sodium heart healthy   Complete by: As directed    Increase activity slowly   Complete by: As directed       Allergies as of 11/28/2022   No Known Allergies      Medication List     STOP taking these medications    ciprofloxacin 500 MG tablet Commonly known as: CIPRO   doxycycline 100 MG tablet Commonly known as: ADOXA       TAKE these medications    acetaminophen 500 MG tablet Commonly known as: TYLENOL Take 2 tablets (1,000 mg total) by mouth every 8 (eight) hours as needed. What changed: when to take this   diclofenac Sodium 1 % Gel Commonly known as: VOLTAREN Apply 2 g topically See admin instructions. Apply 2 grams twice daily to left shoulder, both knees, right foot and toes, left hip.   docusate sodium 100 MG capsule Commonly known as: COLACE Take 100 mg by mouth 2 (two) times daily.   escitalopram 20 MG tablet Commonly known as: LEXAPRO Take 20 mg by mouth daily.   feeding supplement Liqd Take 237 mLs by mouth 2 (two) times daily between meals.   ferrous sulfate 325 (65 FE) MG tablet Take 325 mg by mouth daily.   finasteride 5 MG tablet Commonly known as: PROSCAR  Take 5 mg by mouth daily.   fluconazole 200 MG tablet Commonly known as: DIFLUCAN Take 1 tablet (200 mg total) by mouth daily for 13 days.   furosemide 40 MG tablet Commonly known as: LASIX Take 40 mg by mouth See admin instructions. 40 mg twice daily from 11/20/22-11/25/22, then resume 40 mg once daily on 11/26/22.   guaiFENesin 600 MG 12 hr tablet Commonly known as: MUCINEX Take 600 mg by mouth 2 (two) times daily as needed for cough or  to loosen phlegm.   latanoprost 0.005 % ophthalmic solution Commonly known as: XALATAN Place 1 drop into both eyes every evening.   lithium carbonate 150 MG capsule Take 150 mg by mouth at bedtime.   LORazepam 1 MG tablet Commonly known as: ATIVAN Take 1 tablet (1 mg total) by mouth 2 (two) times daily as needed for anxiety. What changed:  when to take this reasons to take this   metFORMIN 500 MG tablet Commonly known as: GLUCOPHAGE Take 500 mg by mouth 2 (two) times daily.   mirabegron ER 25 MG Tb24 tablet Commonly known as: MYRBETRIQ Take 25 mg by mouth daily.   multivitamin with minerals Tabs tablet Take 1 tablet by mouth daily.   nystatin powder Generic drug: nystatin Apply 1 Application topically 3 (three) times daily as needed (yeast to groin).   ondansetron 4 MG tablet Commonly known as: ZOFRAN Take 4 mg by mouth daily as needed for nausea or vomiting.   OVER THE COUNTER MEDICATION Take 1,000 mg by mouth 2 (two) times daily. D-Mannose + Cranberry + Probiotics 1000mg  capsules   pantoprazole 40 MG tablet Commonly known as: PROTONIX Take 1 tablet (40 mg total) by mouth daily.   polyethylene glycol 17 g packet Commonly known as: MIRALAX / GLYCOLAX Take 17 g by mouth daily as needed for mild constipation.   SALINE MIST 0.65 % nasal spray Generic drug: sodium chloride Place 1 spray into the nose 2 (two) times daily.   Senna 8.6 MG Caps Take 8.6 mg by mouth daily.   tamsulosin 0.4 MG Caps capsule Commonly known as: FLOMAX Take 0.4 mg by mouth every evening.   temazepam 15 MG capsule Commonly known as: RESTORIL Take 1 capsule (15 mg total) by mouth at bedtime.   timolol 0.5 % ophthalmic solution Commonly known as: TIMOPTIC Place 1 drop into both eyes every evening.   traZODone 100 MG tablet Commonly known as: DESYREL Take 100 mg by mouth at bedtime.   vitamin B-12 100 MCG tablet Commonly known as: CYANOCOBALAMIN Take 100 mcg by mouth daily.    Vitamin D3 50 MCG (2000 UT) Tabs Take 2,000 Units by mouth daily.   zinc oxide 20 % ointment Apply 1 application. topically See admin instructions. Apply topically to buttocks every shift        No Known Allergies  Consultations: None   Procedures/Studies: ECHOCARDIOGRAM COMPLETE  Result Date: 11/27/2022    ECHOCARDIOGRAM REPORT   Patient Name:   Russell Ayers Date of Exam: 11/27/2022 Medical Rec #:  [id number]      Height:       71.0 in Accession #:    [id number]     Weight:       140.0 lb Date of Birth:  12-May-1933      BSA:          1.812 m Patient Age:    89 years       BP:  114/63 mmHg Patient Gender: M              HR:           115 bpm. Exam Location:  Inpatient Procedure: 2D Echo, Cardiac Doppler and Color Doppler Indications:    Syncope R55  History:        Patient has prior history of Echocardiogram examinations, most                 recent 02/25/2019. Signs/Symptoms:Syncope; Risk                 Factors:Hypertension and Diabetes.  Sonographer:    Lucendia Herrlich Referring Phys: Herminio Heads IMPRESSIONS  1. Left ventricular ejection fraction, by estimation, is 55%. The left ventricle has normal function. The left ventricle has no regional wall motion abnormalities. Left ventricular diastolic parameters are consistent with Grade I diastolic dysfunction (impaired relaxation).  2. Right ventricular systolic function is normal. The right ventricular size is normal. There is moderately elevated pulmonary artery systolic pressure. The estimated right ventricular systolic pressure is 45.5 mmHg.  3. Left atrial size was severely dilated.  4. Right atrial size was moderately dilated.  5. The mitral valve is degenerative. Mild mitral valve regurgitation. No evidence of mitral stenosis.  6. Tricuspid valve regurgitation is moderate.  7. The aortic valve is tricuspid. Aortic valve regurgitation is not visualized. Aortic valve sclerosis/calcification is present, without any evidence of  aortic stenosis.  8. The inferior vena cava is dilated in size with <50% respiratory variability, suggesting right atrial pressure of 15 mmHg. FINDINGS  Left Ventricle: Left ventricular ejection fraction, by estimation, is 55%. The left ventricle has normal function. The left ventricle has no regional wall motion abnormalities. The left ventricular internal cavity size was normal in size. There is no left ventricular hypertrophy. Abnormal (paradoxical) septal motion, consistent with left bundle branch block. Left ventricular diastolic parameters are consistent with Grade I diastolic dysfunction (impaired relaxation). Right Ventricle: The right ventricular size is normal. No increase in right ventricular wall thickness. Right ventricular systolic function is normal. There is moderately elevated pulmonary artery systolic pressure. The tricuspid regurgitant velocity is 2.76 m/s, and with an assumed right atrial pressure of 15 mmHg, the estimated right ventricular systolic pressure is 45.5 mmHg. Left Atrium: Left atrial size was severely dilated. Right Atrium: Right atrial size was moderately dilated. Pericardium: Trivial pericardial effusion is present. The pericardial effusion is localized near the right atrium. Mitral Valve: The mitral valve is degenerative in appearance. Mild mitral annular calcification. Mild mitral valve regurgitation. No evidence of mitral valve stenosis. Tricuspid Valve: The tricuspid valve is normal in structure. Tricuspid valve regurgitation is moderate . No evidence of tricuspid stenosis. Aortic Valve: The aortic valve is tricuspid. Aortic valve regurgitation is not visualized. Aortic valve sclerosis/calcification is present, without any evidence of aortic stenosis. Aortic valve peak gradient measures 10.9 mmHg. Pulmonic Valve: The pulmonic valve was normal in structure. Pulmonic valve regurgitation is trivial. No evidence of pulmonic stenosis. Aorta: The aortic root is normal in size and  structure. Venous: The inferior vena cava is dilated in size with less than 50% respiratory variability, suggesting right atrial pressure of 15 mmHg. IAS/Shunts: No atrial level shunt detected by color flow Doppler.  LEFT VENTRICLE PLAX 2D LVIDd:         4.90 cm   Diastology LVIDs:         3.50 cm   LV e' medial:    5.87 cm/s  LV PW:         0.70 cm   LV E/e' medial:  16.5 LV IVS:        0.80 cm   LV e' lateral:   9.25 cm/s LVOT diam:     2.20 cm   LV E/e' lateral: 10.5 LV SV:         75 LV SV Index:   41 LVOT Area:     3.80 cm  RIGHT VENTRICLE             IVC RV S prime:     13.30 cm/s  IVC diam: 2.50 cm TAPSE (M-mode): 1.6 cm LEFT ATRIUM             Index        RIGHT ATRIUM           Index LA diam:        4.90 cm 2.70 cm/m   RA Area:     21.90 cm LA Vol (A2C):   45.1 ml 24.89 ml/m  RA Volume:   66.60 ml  36.76 ml/m LA Vol (A4C):   71.1 ml 39.24 ml/m LA Biplane Vol: 63.0 ml 34.77 ml/m  AORTIC VALVE AV Area (Vmax): 2.43 cm AV Vmax:        165.00 cm/s AV Peak Grad:   10.9 mmHg LVOT Vmax:      105.57 cm/s LVOT Vmean:     67.900 cm/s LVOT VTI:       0.197 m  AORTA Ao Root diam: 3.60 cm Ao Asc diam:  3.50 cm MITRAL VALVE                TRICUSPID VALVE MV Area (PHT): 6.12 cm     TR Peak grad:   30.5 mmHg MV Decel Time: 124 msec     TR Vmax:        276.00 cm/s MR Peak grad: 27.9 mmHg MR Vmax:      264.00 cm/s   SHUNTS MV E velocity: 96.70 cm/s   Systemic VTI:  0.20 m MV A velocity: 130.00 cm/s  Systemic Diam: 2.20 cm MV E/A ratio:  0.74 Arvilla Meres MD Electronically signed by Arvilla Meres MD Signature Date/Time: 11/27/2022/2:55:43 PM    Final    MR BRAIN WO CONTRAST  Result Date: 11/25/2022 CLINICAL DATA:  Syncope EXAM: MRI HEAD WITHOUT CONTRAST TECHNIQUE: Multiplanar, multiecho pulse sequences of the brain and surrounding structures were obtained without intravenous contrast. COMPARISON:  07/28/2020 FINDINGS: Brain: No acute infarct, mass effect or extra-axial collection. No acute or chronic  hemorrhage. There is multifocal hyperintense T2-weighted signal within the white matter. Generalized volume loss. The midline structures are normal. Vascular: Major flow voids are preserved. Skull and upper cervical spine: Normal calvarium and skull base. Visualized upper cervical spine and soft tissues are normal. Sinuses/Orbits:No paranasal sinus fluid levels or advanced mucosal thickening. No mastoid or middle ear effusion. Normal orbits. IMPRESSION: 1. No acute intracranial abnormality. 2. Findings of chronic small vessel ischemia and volume loss. Electronically Signed   By: Deatra Robinson M.D.   On: 11/25/2022 23:16   DG Thoracic Spine 2 View  Result Date: 11/25/2022 CLINICAL DATA:  Syncope.  Back pain. EXAM: THORACIC SPINE 2 VIEWS COMPARISON:  Chest CT, 09/30/2022. FINDINGS: Mild compression deformity of T12, not well-defined, but stable compared to the prior CT. No acute fracture. No bone lesion. Mild dextroscoliosis, apex along the midthoracic spine. Mild loss of disc height with minor endplate spurring along the mid  to lower thoracic spine. Skeletal structures are diffusely demineralized. Soft tissues are unremarkable. IMPRESSION: 1. No acute fracture or acute finding. Stable appearance from the prior chest CT scan from 09/30/2022. 2. Mild chronic compression fracture of T12. 3. Mild disc degenerative changes and dextroscoliosis. 4. Diffuse skeletal demineralization. Electronically Signed   By: Amie Portland M.D.   On: 11/25/2022 15:45   DG Lumbar Spine Complete  Result Date: 11/25/2022 CLINICAL DATA:  Syncope.  Back pain. EXAM: LUMBAR SPINE - COMPLETE 4+ VIEW COMPARISON:  CT abdomen and pelvis, 07/16/2022. FINDINGS: No fracture.  No bone lesion. Grade 1 retrolisthesis of L1 and L2.  No other spondylolisthesis. Moderate levoscoliosis, apex at L2. Moderate loss of disc height with mild endplate sclerosis and spurring at L1-L2. Remaining disc spaces are well preserved. Aortic atherosclerotic  calcifications. Soft tissues otherwise unremarkable. Skeletal structures are diffusely demineralized. IMPRESSION: 1. No fracture or acute finding. 2. Degenerative changes and levoscoliosis as detailed, stable from the prior CT scan. Electronically Signed   By: Amie Portland M.D.   On: 11/25/2022 15:43   DG Chest 1 View  Result Date: 11/25/2022 CLINICAL DATA:  Syncope.  Back pain. EXAM: CHEST  1 VIEW COMPARISON:  09/30/2022. FINDINGS: Cardiac silhouette is normal in size. No mediastinal or hilar masses. Lungs demonstrate interstitial prominence, stable. There are also stable linear opacities at the lung bases consistent with atelectasis and/or scarring. No evidence of pneumonia or pulmonary edema. No convincing pleural effusion and no pneumothorax. Skeletal structures are grossly intact. IMPRESSION: No acute cardiopulmonary disease. Electronically Signed   By: Amie Portland M.D.   On: 11/25/2022 15:41   CT Head Wo Contrast  Result Date: 11/25/2022 CLINICAL DATA:  Head and neck trauma EXAM: CT HEAD WITHOUT CONTRAST CT CERVICAL SPINE WITHOUT CONTRAST TECHNIQUE: Multidetector CT imaging of the head and cervical spine was performed following the standard protocol without intravenous contrast. Multiplanar CT image reconstructions of the cervical spine were also generated. RADIATION DOSE REDUCTION: This exam was performed according to the departmental dose-optimization program which includes automated exposure control, adjustment of the mA and/or kV according to patient size and/or use of iterative reconstruction technique. COMPARISON:  09/30/2022 FINDINGS: CT HEAD FINDINGS Brain: No evidence of acute infarction, hemorrhage, hydrocephalus, or mass lesion. Unchanged, chronic bilateral fluid attenuation hygromas (series 5, image 18). Periventricular and deep white matter hypodensity. Vascular: No hyperdense vessel or unexpected calcification. Skull: Normal. Negative for fracture or focal lesion. Sinuses/Orbits: No  acute finding. Other: None. CT CERVICAL SPINE FINDINGS Alignment: Normal. Skull base and vertebrae: No acute fracture. No primary bone lesion or focal pathologic process. Soft tissues and spinal canal: No prevertebral fluid or swelling. No visible canal hematoma. Disc levels: Mild multilevel disc space height loss and osteophytosis throughout the cervical spine. Upper chest: Negative. Other: None. IMPRESSION: 1. No acute intracranial pathology. Small-vessel white matter disease. 2. Unchanged, chronic bilateral fluid attenuation hygromas. 3. No fracture or static subluxation of the cervical spine. 4. Mild multilevel disc degenerative disease throughout the cervical spine. Electronically Signed   By: Jearld Lesch M.D.   On: 11/25/2022 15:25   CT Cervical Spine Wo Contrast  Result Date: 11/25/2022 CLINICAL DATA:  Head and neck trauma EXAM: CT HEAD WITHOUT CONTRAST CT CERVICAL SPINE WITHOUT CONTRAST TECHNIQUE: Multidetector CT imaging of the head and cervical spine was performed following the standard protocol without intravenous contrast. Multiplanar CT image reconstructions of the cervical spine were also generated. RADIATION DOSE REDUCTION: This exam was performed according to the departmental dose-optimization  program which includes automated exposure control, adjustment of the mA and/or kV according to patient size and/or use of iterative reconstruction technique. COMPARISON:  09/30/2022 FINDINGS: CT HEAD FINDINGS Brain: No evidence of acute infarction, hemorrhage, hydrocephalus, or mass lesion. Unchanged, chronic bilateral fluid attenuation hygromas (series 5, image 18). Periventricular and deep white matter hypodensity. Vascular: No hyperdense vessel or unexpected calcification. Skull: Normal. Negative for fracture or focal lesion. Sinuses/Orbits: No acute finding. Other: None. CT CERVICAL SPINE FINDINGS Alignment: Normal. Skull base and vertebrae: No acute fracture. No primary bone lesion or focal pathologic  process. Soft tissues and spinal canal: No prevertebral fluid or swelling. No visible canal hematoma. Disc levels: Mild multilevel disc space height loss and osteophytosis throughout the cervical spine. Upper chest: Negative. Other: None. IMPRESSION: 1. No acute intracranial pathology. Small-vessel white matter disease. 2. Unchanged, chronic bilateral fluid attenuation hygromas. 3. No fracture or static subluxation of the cervical spine. 4. Mild multilevel disc degenerative disease throughout the cervical spine. Electronically Signed   By: Jearld Lesch M.D.   On: 11/25/2022 15:25     Discharge Exam: Vitals:   11/28/22 0410 11/28/22 0740  BP: 132/63 133/67  Pulse: 82 88  Resp: 16   Temp: 97.7 F (36.5 C) 97.8 F (36.6 C)  SpO2: 95% 97%   Vitals:   11/27/22 1931 11/27/22 2312 11/28/22 0410 11/28/22 0740  BP: 123/81 122/81 132/63 133/67  Pulse: 60  82 88  Resp: 17 17 16    Temp: 98.1 F (36.7 C) 97.8 F (36.6 C) 97.7 F (36.5 C) 97.8 F (36.6 C)  TempSrc: Oral Oral Oral Oral  SpO2: 95% 93% 95% 97%  Weight:      Height:        General: Pt is alert, awake, not in acute distress Cardiovascular: RRR, S1/S2 +, no rubs, no gallops Respiratory: CTA bilaterally, no wheezing, no rhonchi Abdominal: Soft, NT, ND, bowel sounds + Extremities: no edema, no cyanosis Foley with clear, yellow urine output    The results of significant diagnostics from this hospitalization (including imaging, microbiology, ancillary and laboratory) are listed below for reference.     Microbiology: Recent Results (from the past 240 hour(s))  Urine Culture     Status: Abnormal   Collection Time: 11/25/22  4:27 PM   Specimen: Urine, Bag (ped)  Result Value Ref Range Status   Specimen Description URINE, BAG PED  Final   Special Requests   Final    NONE Performed at [facility], [address], [place], [place] [address]    Culture >=100,000 COLONIES/mL YEAST (A)  Final   Report Status 11/27/2022  FINAL  Final  MRSA Next Gen by PCR, Nasal     Status: None   Collection Time: 11/26/22  5:35 AM   Specimen: Nasal Mucosa; Nasal Swab  Result Value Ref Range Status   MRSA by PCR Next Gen NOT DETECTED NOT DETECTED Final    Comment: (NOTE) The GeneXpert MRSA Assay (FDA approved for NASAL specimens only), is one component of a comprehensive MRSA colonization surveillance program. It is not intended to diagnose MRSA infection nor to guide or monitor treatment for MRSA infections. Test performance is not FDA approved in patients less than 62 years old. Performed at [facility], [address], [place], [place] [address]      Labs: BNP (last 3 results) Recent Labs    07/16/22 1329  BNP 120.2*   Basic Metabolic Panel: Recent Labs  Lab 11/25/22 1443  11/26/22 0021 11/27/22 0023  NA 133* 131* 133*  K 3.9 4.3 3.8  CL 97* 99 103  CO2 19* 26 24  GLUCOSE 149* 127* 115*  BUN 11 10 6*  CREATININE 1.15 1.01 0.74  CALCIUM 8.8* 8.2* 8.1*  MG  --   --  1.8   Liver Function Tests: Recent Labs  Lab 11/26/22 0021  AST 27  ALT 13  ALKPHOS 100  BILITOT 0.3  PROT 6.0*  ALBUMIN 2.2*   No results for input(s): "LIPASE", "AMYLASE" in the last 168 hours. No results for input(s): "AMMONIA" in the last 168 hours. CBC: Recent Labs  Lab 11/25/22 1443 11/26/22 0021 11/26/22 0704 11/27/22 0023 11/28/22 0803  WBC 19.1* 8.8  --  7.6 8.3  NEUTROABS 17.4* 5.9  --   --   --   HGB 7.9* 7.0* 7.2* 7.5* 8.3*  HCT 26.1* 22.8* 23.2* 24.1* 26.8*  MCV 89.1 87.4  --  89.9 90.8  PLT 456* 388  --  379 387   Cardiac Enzymes: No results for input(s): "CKTOTAL", "CKMB", "CKMBINDEX", "TROPONINI" in the last 168 hours. BNP: Invalid input(s): "POCBNP" CBG: Recent Labs  Lab 11/27/22 0625 11/27/22 1151 11/27/22 1547 11/27/22 2122 11/28/22 0553  GLUCAP 105* 111* 186* 126* 104*   D-Dimer No results for input(s): "DDIMER" in the last 72 hours. Hgb A1c Recent Labs    11/26/22 0021   HGBA1C 6.1*   Lipid Profile No results for input(s): "CHOL", "HDL", "LDLCALC", "TRIG", "CHOLHDL", "LDLDIRECT" in the last 72 hours. Thyroid function studies No results for input(s): "TSH", "T4TOTAL", "T3FREE", "THYROIDAB" in the last 72 hours.  Invalid input(s): "FREET3" Anemia work up Recent Labs    11/26/22 0021  VITAMINB12 429  FOLATE 4.0*  FERRITIN 152  TIBC 189*  IRON 15*  RETICCTPCT 1.9   Urinalysis    Component Value Date/Time   COLORURINE YELLOW 11/25/2022 1444   APPEARANCEUR CLOUDY (A) 11/25/2022 1444   LABSPEC 1.012 11/25/2022 1444   PHURINE 6.0 11/25/2022 1444   GLUCOSEU NEGATIVE 11/25/2022 1444   HGBUR NEGATIVE 11/25/2022 1444   BILIRUBINUR NEGATIVE 11/25/2022 1444   KETONESUR NEGATIVE 11/25/2022 1444   PROTEINUR 30 (A) 11/25/2022 1444   NITRITE NEGATIVE 11/25/2022 1444   LEUKOCYTESUR LARGE (A) 11/25/2022 1444   Sepsis Labs Recent Labs  Lab 11/25/22 1443 11/26/22 0021 11/27/22 0023 11/28/22 0803  WBC 19.1* 8.8 7.6 8.3   Microbiology Recent Results (from the past 240 hour(s))  Urine Culture     Status: Abnormal   Collection Time: 11/25/22  4:27 PM   Specimen: Urine, Bag (ped)  Result Value Ref Range Status   Specimen Description URINE, BAG PED  Final   Special Requests   Final    NONE Performed at [facility], [address], [place], [place] [address]    Culture >=100,000 COLONIES/mL YEAST (A)  Final   Report Status 11/27/2022 FINAL  Final  MRSA Next Gen by PCR, Nasal     Status: None   Collection Time: 11/26/22  5:35 AM   Specimen: Nasal Mucosa; Nasal Swab  Result Value Ref Range Status   MRSA by PCR Next Gen NOT DETECTED NOT DETECTED Final    Comment: (NOTE) The GeneXpert MRSA Assay (FDA approved for NASAL specimens only), is one component of a comprehensive MRSA colonization surveillance program. It is not intended to diagnose MRSA infection nor to guide or monitor treatment for MRSA infections. Test performance is not FDA  approved in patients less than 2  years old. Performed at [facility], [address], [place], [place] [address]      Time coordinating discharge: 35 minutes  SIGNED:   Erick Blinks, DO [facility] 11/28/2022, 9:38 AM  If 7PM-7AM, please contact night-coverage [email or web address]

## 2022-11-28 NOTE — Care Management Important Message (Signed)
Important Message  Patient Details  Name: Russell Ayers MRN: [id number] Date of Birth: Mar 08, 1933   Medicare Important Message Given:  Yes Patient left prior to IM delivery will mail a copy to the patient home address.     Runa Whittingham 11/28/2022, 11:55 AM

## 2022-11-28 NOTE — TOC Progression Note (Signed)
Transition of Care [facility]) - Progression Note    Patient Details  Name: Russell Ayers MRN: [id number] Date of Birth: 1932-10-03  Transition of Care [facility]) CM/SW Contact  Nasteho Glantz A [place], [place] Phone Number: 11/28/2022, 10:36 AM  Clinical Narrative:     CSW contacted pt's daughter Geanie Berlin [phone], regarding discharge at [facility] notes states she is to provide transportation. There was no answer and CSW was unable to leave a VM.   CSW will follow up again at another more opportune time.   TOC will continue to follow.    Expected Discharge Plan: Assisted Living Barriers to Discharge: Continued Medical Work up  Expected Discharge Plan and Services   Discharge Planning Services: CM Consult Post Acute Care Choice: Home Health Living arrangements for the past 2 months: Assisted Living Facility Expected Discharge Date: 11/28/22                                     Social Determinants of Health (SDOH) Interventions SDOH Screenings   Food Insecurity: No Food Insecurity (11/25/2022)  Housing: Low Risk  (11/25/2022)  Transportation Needs: No Transportation Needs (11/25/2022)  Utilities: Not At Risk (10/01/2022)  Tobacco Use: Medium Risk (10/01/2022)    Readmission Risk Interventions    07/18/2022   12:00 PM 07/17/2022    2:49 PM  Readmission Risk Prevention Plan  Transportation Screening Complete Complete  PCP or Specialist Appt within 5-7 Days Complete Complete  Home Care Screening Complete Complete  Medication Review (RN CM) Complete Complete

## 2022-11-28 NOTE — [place] FL2 (Signed)
[place] MEDICAID FL2 LEVEL OF CARE FORM     IDENTIFICATION  Patient Name: Russell Ayers Birthdate: 28-Dec-1932 Sex: male Admission Date (Current Location): 11/25/2022  [facility] and [place] Number:  Producer, television/film/video and Address:  [facility] [place], [address] [place], [place] [address]      Provider Number: [id number]  Attending Physician Name and Address:  Erick Blinks, DO  Relative Name and Phone Number:  Liam Rogers (Daughter)  [phone]    Current Level of Care: Other (Comment) (Assisted Living Facility) Recommended Level of Care: Assisted Living Facility Prior Approval Number:    Date Approved/Denied:   PASRR Number:    Discharge Plan: Other (Comment) (ALF)    Current Diagnoses: Patient Active Problem List   Diagnosis Date Noted   Sepsis (HCC) 11/25/2022   Syncope and collapse 11/25/2022   Hyponatremia 09/30/2022   UTI (urinary tract infection) due to urinary indwelling catheter (HCC) 09/30/2022   UTI (urinary tract infection) due to urinary indwelling Foley catheter (HCC) 09/30/2022   Iron deficiency anemia due to chronic blood loss 09/30/2022   Left lower lobe pneumonia 07/16/2022   Constipation 07/16/2022   Generalized anxiety disorder 07/16/2022   Glaucoma 07/16/2022   Memory impairment 07/16/2022   Primary insomnia 07/16/2022   Recurrent falls 07/16/2022   Type 2 diabetes mellitus with other specified complication (HCC) 07/16/2022   Groin pain 07/16/2022   Prediabetes 02/05/2021   BPH (benign prostatic hyperplasia) 02/05/2021   S/p left hip fracture 02/04/2021   Left lower quadrant abdominal pain    Seizure (HCC)    SBO (small bowel obstruction) (HCC) 07/26/2020   Essential hypertension 07/26/2020   Diabetic neuropathy (HCC) 04/30/2020   Pain due to onychomycosis of toenails of both feet 11/29/2018    Orientation RESPIRATION BLADDER Height & Weight     Self, Time, Situation, Place  Normal Incontinent (urethral  catheter) Weight: 140 lb (63.5 kg) Height:  5\' 11"  (180.3 cm)  BEHAVIORAL SYMPTOMS/MOOD NEUROLOGICAL BOWEL NUTRITION STATUS      Continent Diet (low sodium heart healthy)  AMBULATORY STATUS COMMUNICATION OF NEEDS Skin   Limited Assist Verbally Normal                       Personal Care Assistance Level of Assistance  Bathing, Feeding, Dressing Bathing Assistance: Limited assistance Feeding assistance: Independent Dressing Assistance: Limited assistance     Functional Limitations Info  Sight, Hearing, Speech Sight Info: Impaired (wears glasses) Hearing Info: Impaired Speech Info: Adequate    SPECIAL CARE FACTORS FREQUENCY  PT (By licensed PT), OT (By licensed OT)     PT Frequency: Eval and Treat OT Frequency: Eval and Treat            Contractures Contractures Info: Not present    Additional Factors Info  Code Status, Allergies Code Status Info: DNR Allergies Info: No Known Allergies  Psychotropic Info: See MAR         Current Medications (11/28/2022):  This is the current hospital active medication list Current Facility-Administered Medications  Medication Dose Route Frequency Provider Last Rate Last Admin   0.9 %  sodium chloride infusion   Intravenous Continuous Kathlen Mody, MD 125 mL/hr at 11/28/22 0549 New Bag at 11/28/22 0549   acetaminophen (TYLENOL) tablet 1,000 mg  1,000 mg Oral Q8H PRN Kathlen Mody, MD       aspirin chewable tablet 81 mg  81 mg Oral Daily Kathlen Mody, MD  81 mg at 11/28/22 0915   Chlorhexidine Gluconate Cloth 2 % PADS 6 each  6 each Topical Daily Kathlen Mody, MD   6 each at 11/28/22 0916   diclofenac Sodium (VOLTAREN) 1 % topical gel 2 g  2 g Topical BID Kathlen Mody, MD   2 g at 11/28/22 0916   enoxaparin (LOVENOX) injection 40 mg  40 mg Subcutaneous Q24H Titus Mould, RPH   40 mg at 11/27/22 2136   escitalopram (LEXAPRO) tablet 20 mg  20 mg Oral Daily Kathlen Mody, MD   20 mg at 11/28/22 0915   feeding supplement (ENSURE  ENLIVE / ENSURE PLUS) liquid 237 mL  237 mL Oral BID BM Sherryll Burger, Pratik D, DO   237 mL at 11/28/22 0915   ferrous sulfate tablet 325 mg  325 mg Oral Daily Kathlen Mody, MD   325 mg at 11/28/22 0915   finasteride (PROSCAR) tablet 5 mg  5 mg Oral Daily Kathlen Mody, MD   5 mg at 11/28/22 0915   fluconazole (DIFLUCAN) tablet 200 mg  200 mg Oral Daily Sherryll Burger, Pratik D, DO   200 mg at 11/28/22 0916   insulin aspart (novoLOG) injection 0-6 Units  0-6 Units Subcutaneous TID WC Kathlen Mody, MD   1 Units at 11/27/22 1647   latanoprost (XALATAN) 0.005 % ophthalmic solution 1 drop  1 drop Both Eyes QPM Kathlen Mody, MD   1 drop at 11/27/22 1811   lithium carbonate capsule 150 mg  150 mg Oral QHS Kathlen Mody, MD   150 mg at 11/27/22 2136   LORazepam (ATIVAN) tablet 1 mg  1 mg Oral BID PRN Kathlen Mody, MD       mirabegron ER (MYRBETRIQ) tablet 25 mg  25 mg Oral Daily Kathlen Mody, MD   25 mg at 11/28/22 0915   multivitamin with minerals tablet 1 tablet  1 tablet Oral Daily Sherryll Burger, Pratik D, DO   1 tablet at 11/28/22 0915   pantoprazole (PROTONIX) EC tablet 40 mg  40 mg Oral Daily Kathlen Mody, MD   40 mg at 11/28/22 0915   polyethylene glycol (MIRALAX / GLYCOLAX) packet 17 g  17 g Oral Daily PRN Sherryll Burger, Pratik D, DO   17 g at 11/26/22 1940   tamsulosin (FLOMAX) capsule 0.4 mg  0.4 mg Oral QPM Kathlen Mody, MD   0.4 mg at 11/27/22 1811   temazepam (RESTORIL) capsule 15 mg  15 mg Oral QHS Kathlen Mody, MD   15 mg at 11/27/22 2136   timolol (TIMOPTIC) 0.5 % ophthalmic solution 1 drop  1 drop Both Eyes QPM Kathlen Mody, MD   1 drop at 11/27/22 1811   traZODone (DESYREL) tablet 100 mg  100 mg Oral QHS Kathlen Mody, MD   100 mg at 11/27/22 2136   vitamin B-12 (CYANOCOBALAMIN) tablet 100 mcg  100 mcg Oral Daily Kathlen Mody, MD   100 mcg at 11/27/22 1914     Discharge Medications:  Medication List       STOP taking these medications     ciprofloxacin 500 MG tablet Commonly known as: CIPRO     doxycycline 100 MG tablet Commonly known as: ADOXA           TAKE these medications     acetaminophen 500 MG tablet Commonly known as: TYLENOL Take 2 tablets (1,000 mg total) by mouth every 8 (eight) hours as needed. What changed: when to take this    diclofenac Sodium 1 % Gel Commonly known  as: VOLTAREN Apply 2 g topically See admin instructions. Apply 2 grams twice daily to left shoulder, both knees, right foot and toes, left hip.    docusate sodium 100 MG capsule Commonly known as: COLACE Take 100 mg by mouth 2 (two) times daily.    escitalopram 20 MG tablet Commonly known as: LEXAPRO Take 20 mg by mouth daily.    feeding supplement Liqd Take 237 mLs by mouth 2 (two) times daily between meals.    ferrous sulfate 325 (65 FE) MG tablet Take 325 mg by mouth daily.    finasteride 5 MG tablet Commonly known as: PROSCAR Take 5 mg by mouth daily.    fluconazole 200 MG tablet Commonly known as: DIFLUCAN Take 1 tablet (200 mg total) by mouth daily for 13 days.    furosemide 40 MG tablet Commonly known as: LASIX Take 40 mg by mouth See admin instructions. 40 mg twice daily from 11/20/22-11/25/22, then resume 40 mg once daily on 11/26/22.    guaiFENesin 600 MG 12 hr tablet Commonly known as: MUCINEX Take 600 mg by mouth 2 (two) times daily as needed for cough or to loosen phlegm.    latanoprost 0.005 % ophthalmic solution Commonly known as: XALATAN Place 1 drop into both eyes every evening.    lithium carbonate 150 MG capsule Take 150 mg by mouth at bedtime.    LORazepam 1 MG tablet Commonly known as: ATIVAN Take 1 tablet (1 mg total) by mouth 2 (two) times daily as needed for anxiety. What changed:  when to take this reasons to take this    metFORMIN 500 MG tablet Commonly known as: GLUCOPHAGE Take 500 mg by mouth 2 (two) times daily.    mirabegron ER 25 MG Tb24 tablet Commonly known as: MYRBETRIQ Take 25 mg by mouth daily.    multivitamin with minerals Tabs  tablet Take 1 tablet by mouth daily.    nystatin powder Generic drug: nystatin Apply 1 Application topically 3 (three) times daily as needed (yeast to groin).    ondansetron 4 MG tablet Commonly known as: ZOFRAN Take 4 mg by mouth daily as needed for nausea or vomiting.    OVER THE COUNTER MEDICATION Take 1,000 mg by mouth 2 (two) times daily. D-Mannose + Cranberry + Probiotics 1000mg  capsules    pantoprazole 40 MG tablet Commonly known as: PROTONIX Take 1 tablet (40 mg total) by mouth daily.    polyethylene glycol 17 g packet Commonly known as: MIRALAX / GLYCOLAX Take 17 g by mouth daily as needed for mild constipation.    SALINE MIST 0.65 % nasal spray Generic drug: sodium chloride Place 1 spray into the nose 2 (two) times daily.    Senna 8.6 MG Caps Take 8.6 mg by mouth daily.    tamsulosin 0.4 MG Caps capsule Commonly known as: FLOMAX Take 0.4 mg by mouth every evening.    temazepam 15 MG capsule Commonly known as: RESTORIL Take 1 capsule (15 mg total) by mouth at bedtime.    timolol 0.5 % ophthalmic solution Commonly known as: TIMOPTIC Place 1 drop into both eyes every evening.    traZODone 100 MG tablet Commonly known as: DESYREL Take 100 mg by mouth at bedtime.    vitamin B-12 100 MCG tablet Commonly known as: CYANOCOBALAMIN Take 100 mcg by mouth daily.    Vitamin D3 50 MCG (2000 UT) Tabs Take 2,000 Units by mouth daily.    zinc oxide 20 % ointment Apply 1 application. topically See admin  instructions. Apply topically to buttocks every shift     Additional Information SSN: [id number]  Nysha Koplin A [place], [place]

## 2023-01-09 ENCOUNTER — Ambulatory Visit: Payer: Medicare Other | Admitting: Gastroenterology

## 2024-01-12 IMAGING — DX DG SHOULDER 1V*L*
1 series · 1 of 1 positions shown · non-contrast
Comparison: 08/02/2021

CLINICAL DATA: Postreduction

EXAM:
LEFT SHOULDER

[shoulder]
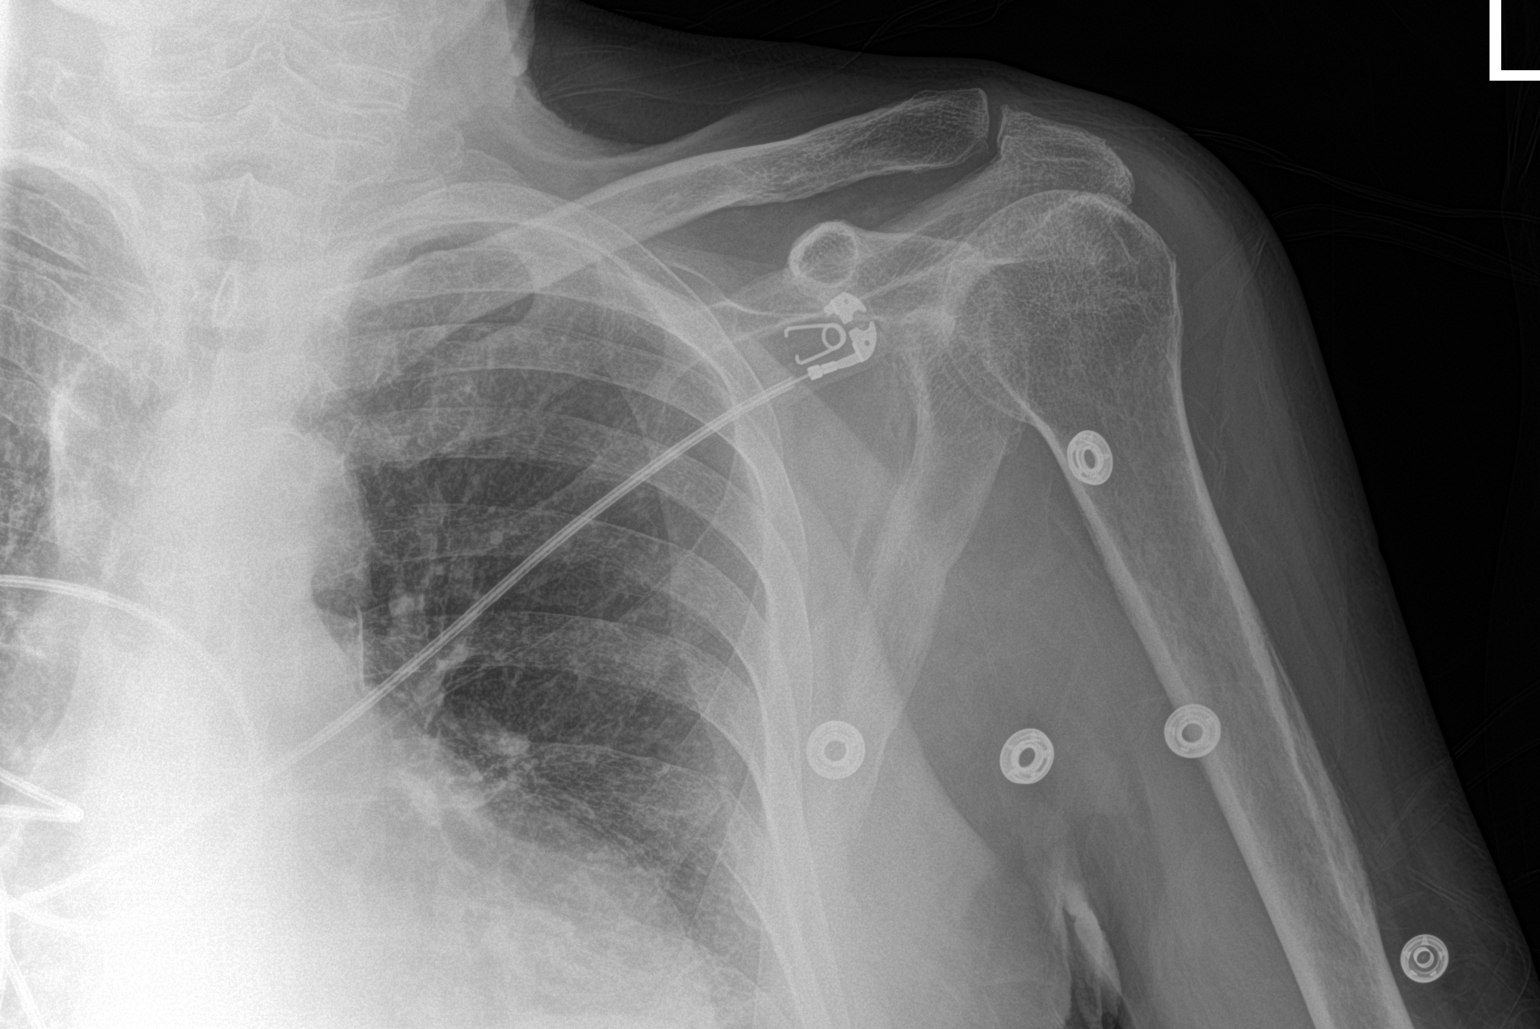

[1 of 1 positions shown; findings below may reference images not displayed]

FINDINGS: Anatomic position of the left shoulder is demonstrated
postreduction. No residual anterior dislocation. Degenerative
changes are suggested in the glenohumeral and acromioclavicular
joints. No discrete fractures are identified on the single view
available.
IMPRESSION: Anatomic position of the left shoulder postreduction.
